# Patient Record
Sex: Female | Born: 1935 | Race: Black or African American | Hispanic: No | Smoking: Never smoker
Health system: Southern US, Community
[De-identification: ages and names within clinical notes are randomized; demographics above are authoritative.]

## PROBLEM LIST (undated history)

## (undated) ENCOUNTER — Emergency Department (HOSPITAL_COMMUNITY): Admission: EM | Payer: Medicare Other

## (undated) DIAGNOSIS — IMO0001 Reserved for inherently not codable concepts without codable children: Secondary | ICD-10-CM

## (undated) DIAGNOSIS — I10 Essential (primary) hypertension: Secondary | ICD-10-CM

---

## 1999-10-08 ENCOUNTER — Inpatient Hospital Stay (HOSPITAL_COMMUNITY): Admission: EM | Admit: 1999-10-08 | Discharge: 1999-10-11 | Payer: Self-pay | Admitting: Emergency Medicine

## 1999-10-08 ENCOUNTER — Encounter: Payer: Self-pay | Admitting: Orthopedic Surgery

## 2000-11-30 ENCOUNTER — Encounter: Payer: Self-pay | Admitting: Internal Medicine

## 2000-11-30 ENCOUNTER — Ambulatory Visit (HOSPITAL_COMMUNITY): Admission: RE | Admit: 2000-11-30 | Discharge: 2000-11-30 | Payer: Self-pay | Admitting: Internal Medicine

## 2000-12-05 ENCOUNTER — Encounter: Payer: Self-pay | Admitting: Internal Medicine

## 2000-12-05 ENCOUNTER — Ambulatory Visit (HOSPITAL_COMMUNITY): Admission: RE | Admit: 2000-12-05 | Discharge: 2000-12-05 | Payer: Self-pay | Admitting: Internal Medicine

## 2006-03-29 ENCOUNTER — Ambulatory Visit (HOSPITAL_COMMUNITY): Admission: RE | Admit: 2006-03-29 | Discharge: 2006-03-29 | Payer: Self-pay | Admitting: Family Medicine

## 2007-08-01 ENCOUNTER — Ambulatory Visit (HOSPITAL_COMMUNITY): Admission: RE | Admit: 2007-08-01 | Discharge: 2007-08-01 | Payer: Self-pay | Admitting: Family Medicine

## 2008-09-14 ENCOUNTER — Ambulatory Visit (HOSPITAL_COMMUNITY): Admission: RE | Admit: 2008-09-14 | Discharge: 2008-09-14 | Payer: Self-pay | Admitting: Family Medicine

## 2010-02-06 ENCOUNTER — Encounter: Payer: Self-pay | Admitting: Family Medicine

## 2010-06-03 NOTE — Op Note (Signed)
[place]  Patient:    Joyce Carpenter, Joyce Carpenter                    MRN: [id number] Proc. Date: 10/08/99 Adm. Date:  [id number] Attending:  Susa Day                           Operative Report  PREOPERATIVE DIAGNOSIS:  Profound abscess, left thumb and index web space, status post laceration of volar aspect of left thumb metacarpal phalangeal joint flexion crease and thenar eminence 72 hours prior.  POSTOPERATIVE DIAGNOSIS:  Thumb and index web space and mid palmar space abscess.  OPERATION:  Incision and drainage of thumb index web space and mid palmar space with placement of a total of four vessel loop drains.  OPERATING SURGEON:  Katy Fitch. Sypher, Montez Hageman., M.D.  ASSISTANT:  Jonni Sanger, P.A.  ANESTHESIA:  Axillary block.  ANESTHESIOLOGIST:  Halford Decamp, M.D.  CULTURES:  Aerobic and anaerobic.  INDICATIONS:  Joyce Carpenter is a 77 year old woman transferred by Dr. Ernestina Penna at the [facility] Emergency Room after identification of a complex abscess involving the left thumb and index web space.  She reported that three days ago she was cutting tomatoes and accidentally lacerated the palmar aspect of her left thenar eminence overlying the MP flexion crease. She did not week medical attention until the morning of October 08, 1999, when she was noted to have profound swelling, fever, and malaise.  At the hospital, she was noted to be febrile and laboratory studies revealed an elevated white blood cell count at 13,000 with 83% neutrophils.  Her platelet count was 242,000.  Dr. Romeo Apple, an orthopedic surgeon in [place], [place], was called for consultation and recommended immediate transfer to [facility] [employer] for evaluation by the hand surgery service.  Joyce Carpenter was transported by the EMS unit to the ER and taken directly to the operating room as soon as possible.  DESCRIPTION OF  PROCEDURE:  Joyce Carpenter was brought to the operating room and placed in the supine position on the operating table.  Following placement of an axillary block in the holding area by J. Claybon Jabs, M.D., anesthesia was satisfactory in the left arm.  The arm was prepped with Betadine soap and solution and sterilely draped.  Following elevation of the hand for two minutes, the arterial tourniquet was inflated to 270 mmHg due to mild cephalic hypertension.  The procedure commenced with extension of the traumatic wound, immediately evacuating approximately 15 cc of grossly purulent material.  This was cultured for aerobic and anaerobic growth.  The wound was then extended with a Brunners zigzag incision proximally over the thenar eminence.  Care was taken to identify the radial and ulnar proper digital arteries and the princeps pollicis artery.  There was noted to be moderate subcutaneous necrosis and epidermolysis in the thumb and index web space.  A blunt hemostat was used through the thumb and index web space and a dorsal incision was fashioned to allow through and through irrigation.  The thenar space was probed with a blunt hemostat and drained of a moderate amount of purulent material.  The abscess was extended to the mid palmar space.  This was opened with blunt hemostat closed throughout dissection.  A small incision was fashioned along the middle palmar crease adjacent to the thenar crease to create a mid palmar through and through drainage  route and two vessel loops were placed from the thenar incision through the mid palmar space out through this second incision.  The thumb flexor sheath was inspected and no purulent material was noted within the flexor sheath.  Preoperatively there was no tenderness at [facility] space or overlying the flexor pollicis longus in the forearm.  The wound was thoroughly lavaged with sterile saline followed by triple antibiotic solution.  The  wounds were then dressed with Xeroflo, sterile gauze, Kerlix, sterile Webril, a Kerlix fluff, and a volar plaster splint. Ace wrap was applied for gentle compression.  There were no apparent complications.  Joyce Carpenter was awaken from anesthesia and transferred to the recovery room with stable vital signs.  After cultures were obtained, 1 g of Ancef was noted to have been given in [place], [place], prior to cultures and 1 g of vancomycin was administered here in the operating room.  There were no apparent complications. DD:  10/08/99 TD:  10/10/99 Job: [id number]

## 2010-06-03 NOTE — Discharge Summary (Signed)
[place]  Patient:    Joyce Carpenter, Joyce Carpenter                    MRN: [id number] Adm. Date:  [id number] Disc. Date: [id number] Attending:  Susa Day Dictator:   Jonni Sanger, P.A.-C                           Discharge Summary  ADMISSION DIAGNOSIS:  Left hand thenar space infection.  DISCHARGE DIAGNOSIS:  Left hand thenar space infection.  OPERATION PERFORMED:  I&D with intraoperative cultures of the left hand by Dr. Katy Fitch. Sypher under axillary block on October 08, 1999.  CONSULTATIONS:  None.  HISTORY:  The patient is a 77 year old right-hand dominant female who sustained a puncture wound to the volar aspect of the left hand thenar eminence four days prior to her presentation with a kitchen knife.  She noted progressive pain and swelling to the thenar eminence extending distally into the thumb.  She presented to [facility].  The patient was seen in the emergency room and referred for further evaluation and treatment.  She was noted to have a tense swelling of the thenar eminence with lymphangitis to the elbow level.  Neurovascular motor functions were intact.  She had moderate pain to palpation.  The flexor longus function was okay.  There were no epitrochlear nodes palpated.  It was decided to take the patient to the operating room for I&D and intraoperative cultures with IV antibiotics postoperatively.  LABORATORY DATA:  Preoperative labs revealed hemoglobin of 11.4 with hematocrit 34.2, white blood cell count 13.0, with 242,000 platelets, 83% neutrophils.  Guaiac studies within normal limits.  Preoperative chest x-ray revealed decreased aeration of lung bases related to atelectasis or small effusions.  There was an enlarged cardiac silhouette.  EKG revealed normal sinus rhythm with left ventricular hypertrophy.  Results of her intraoperative cultures revealed moderate alpha hemolytic strep.  There were no  anaerobes isolated.  HOSPITAL COURSE:  The patient was taken to the operating room on October 08, 1999, where she underwent I&D of the left hand thenar space, axillary block, by Katy Fitch. Sypher.  One loop drain was placed.  Postoperatively, she was placed on vancomycin per the pharmacy protocol.  On October 09, 1999, her first day postop, she had a maximum temperature of 102.3 but was afebrile for the remainder of the day.  Gram stain at this time revealed gram-positive cocci, some gram-negative rods.  White blood cell count was down to 11.5.  Her vancomycin was continued per the pharmacy protocol.  On her second day postop, she remained afebrile with stale vital signs.  She would continue to slowly improve.  Her pain was decreasing.  Range of motion was improving.  Dressing was changed on this day, and the wound appearance was okay.  There were no areas of necrosis.  Swelling was markedly decreased both dorsally and across the thenar eminence.  There was no lymphangitis noted. Neurovascular and motor functions were intact distally.  We placed her in new bulky dressing.  The vessel loop drains were left in place secondary to continued serosanguineous drainage.  Her IV vancomycin was continued on her third day postop on September 25.  She remained afebrile.  She was doing well overall.  Her pain was decreasing.  She had a good appetite.  No complaints of numbness or tingling.  On exam, her swelling was  decreasing.  She had good IP motion.  Neurovascular and motor functions were intact.  Her culture was growing gram-positive cocci which was thought to be strep.  There was no growth on anaerobes or of her gram-negative rod which was initially seen on Gram stain.  At this time, it was felt that the patient was stable and ready for discharge to home.  DISCHARGE MEDICATIONS:  Levaquin 500 mg q.d. for two weeks.  FOLLOWUP:  She will return to see Dr. Josephine Igo in our office  on Wednesday, October 12, 1999, at 2 oclock.  WOUND CARE:  She will keep her dressing dry and keep her hand elevated.  SPECIAL INSTRUCTIONS:  We will have the social worker assist the patient with her antibiotic prescription needs.  DIET:  As tolerated.  CONDITION UPON DISCHARGE:  Stable and improved.  FINAL DIAGNOSIS:  Left hand thenar space infection requiring incision and drainage. DD:  11/15/99 TD:  11/15/99 Job: [id number]

## 2011-08-16 ENCOUNTER — Ambulatory Visit (HOSPITAL_COMMUNITY)
Admission: RE | Admit: 2011-08-16 | Discharge: 2011-08-16 | Disposition: A | Discharge status: Home / Self Care | Payer: Medicare Other | Source: Ambulatory Visit | Attending: Family Medicine | Admitting: Family Medicine

## 2011-08-16 ENCOUNTER — Other Ambulatory Visit (HOSPITAL_COMMUNITY): Payer: Self-pay | Admitting: Family Medicine

## 2011-08-16 DIAGNOSIS — M7500 Adhesive capsulitis of unspecified shoulder: Secondary | ICD-10-CM

## 2011-08-16 DIAGNOSIS — R937 Abnormal findings on diagnostic imaging of other parts of musculoskeletal system: Secondary | ICD-10-CM | POA: Insufficient documentation

## 2011-08-16 DIAGNOSIS — M25519 Pain in unspecified shoulder: Secondary | ICD-10-CM | POA: Insufficient documentation

## 2013-10-07 ENCOUNTER — Observation Stay (HOSPITAL_COMMUNITY)
Admission: EM | Admit: 2013-10-07 | Discharge: 2013-10-10 | Disposition: A | Discharge status: Home / Self Care | Payer: PRIVATE HEALTH INSURANCE | Attending: Emergency Medicine | Admitting: Emergency Medicine

## 2013-10-07 DIAGNOSIS — G459 Transient cerebral ischemic attack, unspecified: Principal | ICD-10-CM | POA: Diagnosis present

## 2013-10-07 DIAGNOSIS — I10 Essential (primary) hypertension: Secondary | ICD-10-CM | POA: Diagnosis not present

## 2013-10-07 DIAGNOSIS — R4182 Altered mental status, unspecified: Secondary | ICD-10-CM | POA: Insufficient documentation

## 2013-10-07 DIAGNOSIS — Z79899 Other long term (current) drug therapy: Secondary | ICD-10-CM | POA: Diagnosis not present

## 2013-10-07 HISTORY — DX: Essential (primary) hypertension: I10

## 2013-10-08 ENCOUNTER — Observation Stay (HOSPITAL_COMMUNITY): Payer: PRIVATE HEALTH INSURANCE

## 2013-10-08 ENCOUNTER — Encounter (HOSPITAL_COMMUNITY): Payer: Self-pay | Admitting: Emergency Medicine

## 2013-10-08 ENCOUNTER — Other Ambulatory Visit (HOSPITAL_COMMUNITY): Payer: Medicare Other

## 2013-10-08 ENCOUNTER — Emergency Department (HOSPITAL_COMMUNITY): Payer: PRIVATE HEALTH INSURANCE

## 2013-10-08 DIAGNOSIS — G459 Transient cerebral ischemic attack, unspecified: Secondary | ICD-10-CM | POA: Diagnosis not present

## 2013-10-08 LAB — IRON AND TIBC
Iron: 16 ug/dL — ABNORMAL LOW (ref 42–135)
Saturation Ratios: 6 % — ABNORMAL LOW (ref 20–55)
TIBC: 272 ug/dL (ref 250–470)
UIBC: 256 ug/dL (ref 125–400)

## 2013-10-08 LAB — BASIC METABOLIC PANEL
ANION GAP: 13 (ref 5–15)
BUN: 33 mg/dL — ABNORMAL HIGH (ref 6–23)
CALCIUM: 9.6 mg/dL (ref 8.4–10.5)
CO2: 24 mEq/L (ref 19–32)
Chloride: 101 mEq/L (ref 96–112)
Creatinine, Ser: 1.39 mg/dL — ABNORMAL HIGH (ref 0.50–1.10)
GFR, EST AFRICAN AMERICAN: 40 mL/min — AB (ref >90)
GFR, EST NON AFRICAN AMERICAN: 35 mL/min — AB (ref >90)
GLUCOSE: 107 mg/dL — AB (ref 70–99)
Potassium: 5 mEq/L (ref 3.7–5.3)
SODIUM: 138 meq/L (ref 137–147)

## 2013-10-08 LAB — HEMOGLOBIN A1C
Hgb A1c MFr Bld: 5.7 % — ABNORMAL HIGH (ref <5.7)
MEAN PLASMA GLUCOSE: 117 mg/dL — AB (ref <117)

## 2013-10-08 LAB — PRO B NATRIURETIC PEPTIDE: PRO B NATRI PEPTIDE: 531.4 pg/mL — AB (ref 0–450)

## 2013-10-08 LAB — RETICULOCYTES
RBC.: 2.99 MIL/uL — AB (ref 3.87–5.11)
Retic Count, Absolute: 32.9 10*3/uL (ref 19.0–186.0)
Retic Ct Pct: 1.1 % (ref 0.4–3.1)

## 2013-10-08 LAB — CBC
HCT: 24.6 % — ABNORMAL LOW (ref 36.0–46.0)
Hemoglobin: 7.5 g/dL — ABNORMAL LOW (ref 12.0–15.0)
MCH: 26.7 pg (ref 26.0–34.0)
MCHC: 30.5 g/dL (ref 30.0–36.0)
MCV: 87.5 fL (ref 78.0–100.0)
PLATELETS: 275 10*3/uL (ref 150–400)
RBC: 2.81 MIL/uL — ABNORMAL LOW (ref 3.87–5.11)
RDW: 14.5 % (ref 11.5–15.5)
WBC: 4.3 10*3/uL (ref 4.0–10.5)

## 2013-10-08 LAB — CBC WITH DIFFERENTIAL/PLATELET
BASOS ABS: 0.1 10*3/uL (ref 0.0–0.1)
BASOS PCT: 1 % (ref 0–1)
EOS ABS: 0.4 10*3/uL (ref 0.0–0.7)
EOS PCT: 6 % — AB (ref 0–5)
HCT: 26.6 % — ABNORMAL LOW (ref 36.0–46.0)
Hemoglobin: 8.2 g/dL — ABNORMAL LOW (ref 12.0–15.0)
Lymphocytes Relative: 32 % (ref 12–46)
Lymphs Abs: 2 10*3/uL (ref 0.7–4.0)
MCH: 27.5 pg (ref 26.0–34.0)
MCHC: 30.8 g/dL (ref 30.0–36.0)
MCV: 89.3 fL (ref 78.0–100.0)
Monocytes Absolute: 0.3 10*3/uL (ref 0.1–1.0)
Monocytes Relative: 4 % (ref 3–12)
Neutro Abs: 3.6 10*3/uL (ref 1.7–7.7)
Neutrophils Relative %: 57 % (ref 43–77)
PLATELETS: 288 10*3/uL (ref 150–400)
RBC: 2.98 MIL/uL — ABNORMAL LOW (ref 3.87–5.11)
RDW: 14.9 % (ref 11.5–15.5)
WBC: 6.4 10*3/uL (ref 4.0–10.5)

## 2013-10-08 LAB — VITAMIN B12: VITAMIN B 12: 1156 pg/mL — AB (ref 211–911)

## 2013-10-08 LAB — LIPID PANEL
CHOL/HDL RATIO: 2 ratio
CHOLESTEROL: 125 mg/dL (ref 0–200)
HDL: 62 mg/dL (ref >39)
LDL Cholesterol: 51 mg/dL (ref 0–99)
TRIGLYCERIDES: 62 mg/dL (ref <150)
VLDL: 12 mg/dL (ref 0–40)

## 2013-10-08 LAB — FOLATE

## 2013-10-08 LAB — FERRITIN: Ferritin: 20 ng/mL (ref 10–291)

## 2013-10-08 LAB — SEDIMENTATION RATE: SED RATE: 84 mm/h — AB (ref 0–22)

## 2013-10-08 LAB — AMMONIA: AMMONIA: 18 umol/L (ref 11–60)

## 2013-10-08 MED ORDER — SENNOSIDES-DOCUSATE SODIUM 8.6-50 MG PO TABS
1.0000 | ORAL_TABLET | Freq: Every evening | ORAL | Status: DC | PRN
  Filled 2013-10-08: qty 1

## 2013-10-08 MED ORDER — STROKE: EARLY STAGES OF RECOVERY BOOK
Freq: Once | Status: AC
Start: 2013-10-08 — End: 2013-10-08
  Administered 2013-10-08: 1
  Filled 2013-10-08: qty 1

## 2013-10-08 MED ORDER — ENOXAPARIN SODIUM 30 MG/0.3ML ~~LOC~~ SOLN
30.0000 mg | SUBCUTANEOUS | Status: DC
  Administered 2013-10-09 – 2013-10-10 (×2): 30 mg via SUBCUTANEOUS
  Filled 2013-10-08 (×2): qty 0.3

## 2013-10-08 MED ORDER — ASPIRIN 325 MG PO TABS
325.0000 mg | ORAL_TABLET | Freq: Every day | ORAL | Status: DC
Start: 2013-10-08 — End: 2013-10-10
  Administered 2013-10-08 – 2013-10-10 (×3): 325 mg via ORAL
  Filled 2013-10-08 (×3): qty 1

## 2013-10-08 NOTE — ED Notes (Signed)
Dr. Read Drivers at bedside. Pt. Now able to state name but is still confused.

## 2013-10-08 NOTE — H&P (Signed)
Hospitalist Admission History and Physical  Patient name: Joyce Carpenter Medical record number: [id number] Date of birth: 1930/09/13 Age: 80 y.o. Gender: female  Primary Care Provider: [name], MD  Chief Complaint: TIA  History of Present Illness:This is a 80 y.o. year old female with significant past medical history of HTN presenting with TIA, encephelopathy. Level V caveat as pt and family are overall very poor historians. Per family. Pt was noted to have 1-2 episodes of confusion while at home. Pt has some staring episodes with R hand shaking as well as confusion. Family also noticed ? Elevated BP at home.  Family was concerned and brought pt to the ER for further evaluation.  On presentation, afebrile, HR in 60s, Resp in 10s, BP in 180s-150s. Satting 98% on RA. Notable labs include hgb 8.2, Cr 1.39. Pt had witnessed episode of aphasia and confusion while in ER. Head CT, CXR, UA pending.   Assessment and Plan: Joyce Carpenter is a 80 y.o. year old female presenting with TIA, encephalopathy   Active Problems:   TIA (transient ischemic attack)   1-TIA/encephalopathy  -proceed down stroke pathway including MRI/MRA, 2D ECHO, risk stratification labs (carotid dopplers not available apparently at AP).  -check ammonia level and EEG -f/u UA and CXR-afebrile on presentation without leukocytosis  -full dose ASA  -continue to follow   2-HTN -mildly elevated BP on presentation -will hold on treating given #1 as to avoid hypoperfusion -will trend-treat if markedly above presentation pressures  -noted LE edema  -check pro BNP  -f/u 2D ECHO    3-Anemia  -unclear etiology -no active signs of bleeding  -normocytic MCV -check anemia panel  -hemoccult   4-AKI  -stage 4 CKD on presentation -unclear if this is baseline -will trend  -try to avoid nephrotoxic agents   FEN/GI: NPO pending bedside swallow eval  Prophylaxis: lovenox Disposition: pending further  evaluation  Code Status:Full Code    Patient Active Problem List   Diagnosis Date Noted  . TIA (transient ischemic attack) 10/08/2013   Past Medical History: Past Medical History  Diagnosis Date  . Hypertension     Past Surgical History: No past surgical history on file.  Social History: History   Social History  . Marital Status: Widowed    Spouse Name: N/A    Number of Children: N/A  . Years of Education: N/A   Social History Main Topics  . Smoking status: Not on file  . Smokeless tobacco: Not on file  . Alcohol Use: Not on file  . Drug Use: Not on file  . Sexual Activity: Not on file   Other Topics Concern  . Not on file   Social History Narrative  . No narrative on file    Family History: No family history on file.  Allergies: No Known Allergies  Current Facility-Administered Medications  Medication Dose Route Frequency Provider Last Rate Last Dose  .  stroke: mapping our early stages of recovery book   Does not apply Once Doree Albee, MD      . aspirin tablet 325 mg  325 mg Oral Daily Doree Albee, MD      . enoxaparin (LOVENOX) injection 30 mg  30 mg Subcutaneous Q24H Doree Albee, MD      . senna-docusate (Senokot-S) tablet 1 tablet  1 tablet Oral QHS PRN Doree Albee, MD       No current outpatient prescriptions on file.   Review Of Systems: 12 point ROS negative except as noted  above in HPI.  Physical Exam: Filed Vitals:   10/08/13 0003  BP: 187/98  Pulse: 62  Temp: 98 F (36.7 C)  Resp: 18    General: cooperative and mildly confused  HEENT: PERRLA and extra ocular movement intact Heart: S1, S2 normal, no murmur, rub or gallop, regular rate and rhythm Lungs: clear to auscultation, no wheezes or rales and unlabored breathing Abdomen: abdomen is soft without significant tenderness, masses, organomegaly or guarding Extremities: 2+ peripheral pulses, 1-2+ edema bilaterally  Skin:no rashes, no ecchymoses Neurology: normal without  focal findings  Labs and Imaging: Lab Results  Component Value Date/Time   NA 138 10/08/2013 12:27 AM   K 5.0 10/08/2013 12:27 AM   CL 101 10/08/2013 12:27 AM   CO2 24 10/08/2013 12:27 AM   BUN 33* 10/08/2013 12:27 AM   CREATININE 1.39* 10/08/2013 12:27 AM   GLUCOSE 107* 10/08/2013 12:27 AM   Lab Results  Component Value Date   WBC 6.4 10/08/2013   HGB 8.2* 10/08/2013   HCT 26.6* 10/08/2013   MCV 89.3 10/08/2013   PLT 288 10/08/2013    No results found.         Doree Albee MD  Pager: [phone]

## 2013-10-08 NOTE — ED Notes (Signed)
Pt. Reports that she does not understand why she is at the hospital. Pt. States "I was sitting in my chair dreaming and then they brought me here" Pt. Sister reports that pt. Began having tremors and was "not acting right".

## 2013-10-08 NOTE — ED Notes (Signed)
Pt. Family called RN to bedside stating that "pt is doing it again" Pt. Altered mental status. Pt. Confused and spitting saliva on chest. Pt. Able to speak clearly. Pt. Moving all extremities equally. Pt. Disoriented x4.

## 2013-10-08 NOTE — Progress Notes (Signed)
Tech came from [facility] to perform EEG, patient in X-Ray and MRI per nurse and nurse tech. EEG will be performed 10/09/13

## 2013-10-08 NOTE — Evaluation (Signed)
Clinical/Bedside Swallow Evaluation Patient Details  Name: Joyce Carpenter MRN: [id number] Date of Birth: 12-Mar-1930  Today's Date: 10/08/2013 Time: 0940-1000 SLP Time Calculation (min): 20 min  Past Medical History:  Past Medical History  Diagnosis Date  . Hypertension    Past Surgical History: History reviewed. No pertinent past surgical history. HPI:  Joyce Carpenter is a 80 y.o. female who was brought to the Emergency Department via EMS presenting with AMS according to her family. Per EMS records, the family reported two episodes of AMS this evening.  At the bedside, the Joyce Carpenter states she was dreaming and upon awakening experienced difficulty verbalizing her sister's name even though she knew it. She denies a sense of paralysis upon awakening. The Joyce Carpenter also states her family told her that she was shaking during the episode. She denies chest pain, SOB, nausea, diarrhea, or dysuria.   Assessment / Plan / Recommendation Clinical Impression  Joyce Carpenter is a very pleasant 80 yo woman who was admitted with altered mental status. Bedside swallow evaluation was ordered by admitting MD and RN swallow screen has not been completed. Oral motor examination is uremarkable and Joyce Carpenter readily and safely accepted po trials. She showed no overt signs or symptoms of aspiration and was independently self feeding. Joyce Carpenter had some difficulty answering questions due to suspected hearing impairment. She often requested clarification and repeated questions before answering (delayed processing vs hard of hearing). Joyce Carpenter reports that she "can hear, but can't always understand". She reports that this started rather ubruptly about six years ago. She is unsure if she has ever seen an audiologist. Her sister, Joyce Carpenter, arrived after completion of the evaluation and is unsure if her hearing has been assessed. She feels it is more of a hearing problem than an "understanding" problem. Recommend regular textures with  thin liquids with standard aspiration precautions. SLP will follow for cognitive linguistic evaluation. Joyce Carpenter reportedly lives with her sister, Joyce Carpenter, and it would be helpful to gain an idea of her true baseline from her.    Aspiration Risk  None    Diet Recommendation Regular;Thin liquid   Liquid Administration via: Cup;Straw Medication Administration: Whole meds with liquid Supervision: Patient able to self feed Postural Changes and/or Swallow Maneuvers: Out of bed for meals;Seated upright 90 degrees;Upright 30-60 min after meal    Other  Recommendations Oral Care Recommendations: Oral care BID Other Recommendations: Clarify dietary restrictions   Follow Up Recommendations  Home health SLP (for cognition possibly)    Frequency and Duration        Pertinent Vitals/Pain VSS    SLP Swallow Goals  N/A   Swallow Study Prior Functional Status   Lives at home with sister, Joyce Carpenter Date of Onset: 10/07/13 HPI: Joyce Carpenter is a 80 y.o. female who was brought to the Emergency Department via EMS presenting with AMS according to her family. Per EMS records, the family reported two episodes of AMS this evening.  At the bedside, the Joyce Carpenter states she was dreaming and upon awakening experienced difficulty verbalizing her sister's name even though she knew it. She denies a sense of paralysis upon awakening. The Joyce Carpenter also states her family told her that she was shaking during the episode. She denies chest pain, SOB, nausea, diarrhea, or dysuria. Type of Study: Bedside swallow evaluation Diet Prior to this Study: NPO Temperature Spikes Noted: No Respiratory Status: Room air History of Recent Intubation: No Behavior/Cognition: Alert;Cooperative;Pleasant mood;Hard of hearing Oral Cavity - Dentition:  Adequate natural dentition Self-Feeding Abilities: Able to feed self Patient Positioning: Upright in bed Baseline Vocal Quality: Clear Volitional Cough: Strong Volitional Swallow:  Able to elicit    Oral/Motor/Sensory Function Overall Oral Motor/Sensory Function: Appears within functional limits for tasks assessed   Ice Chips Ice chips: Within functional limits Presentation: Spoon   Thin Liquid Thin Liquid: Within functional limits Presentation: Cup;Self Fed;Straw    Nectar Thick Nectar Thick Liquid: Not tested   Honey Thick Honey Thick Liquid: Not tested   Puree Puree: Within functional limits Presentation: Self Fed;Spoon   Solid   GO Functional Assessment Tool Used: clinical judgement Functional Limitations: Swallowing Swallow Current Status (Z6109): 0 percent impaired, limited or restricted Swallow Goal Status (U0454): 0 percent impaired, limited or restricted Swallow Discharge Status [phone]): 0 percent impaired, limited or restricted  Solid: Within functional limits Presentation: Self Fed       PORTER,DABNEY 10/08/2013,3:23 PM

## 2013-10-08 NOTE — Progress Notes (Signed)
UR completed 

## 2013-10-08 NOTE — Progress Notes (Signed)
Patient has had progressive mild dementia intermittent throughout the past 12-18 months living at home with her sister. Joyce Carpenter ZOX:[id number] DOB: [birth year] DOA: 10/07/2013 PCP: [name], MD             Physical Exam: Blood pressure 158/60, pulse 63, temperature 98.5 F (36.9 C), temperature source Oral, resp. rate 18, height  (1.6 m), weight 153 lb (69.4 kg), SpO2 100.00%. alert and oriented x3. Patient has difficulty understanding some questions in relating family members names. No JVD no carotid bruit thyromegaly or thyroid bruits lungs clear to A&P no rales rhonchi appreciable heart regular positive no heaves thrills or rubs traced 1+ chronic pedal edema from venous insufficiency presumably.   Investigations:  No results found for this or any previous visit (from the past 240 hour(s)).   Basic Metabolic Panel:  Recent Labs  81/19/14 0027  NA 138  K 5.0  CL 101  CO2 24  GLUCOSE 107*  BUN 33*  CREATININE 1.39*  CALCIUM 9.6   Liver Function Tests: No results found for this basename: AST, ALT, ALKPHOS, BILITOT, PROT, ALBUMIN,  in the last 72 hours   CBC:  Recent Labs  10/08/13 0027  WBC 6.4  NEUTROABS 3.6  HGB 8.2*  HCT 26.6*  MCV 89.3  PLT 288    Dg Chest 2 View  10/08/2013   CLINICAL DATA:  Altered mental status and elevated blood pressure tonight  EXAM: CHEST  2 VIEW  COMPARISON:  None.  FINDINGS: Moderately severe cardiac enlargement. Vascular pattern is normal. Right lung is clear. Mild left lower lobe atelectasis. No consolidation or effusion. Calcification of the aorta.  IMPRESSION: Moderate cardiac enlargement.  No acute findings otherwise.   Electronically Signed   By: Esperanza Heir M.D.   On: 10/08/2013 02:15   Ct Head Wo Contrast  10/08/2013   CLINICAL DATA:  Altered mental status.  EXAM: CT HEAD WITHOUT CONTRAST  TECHNIQUE: Contiguous axial images were obtained from the base of the skull through the vertex without  intravenous contrast.  COMPARISON:  None.  FINDINGS: Skull and Sinuses:Negative for fracture or destructive process. The mastoids, middle ears, and imaged paranasal sinuses are clear.  Orbits: No acute abnormality.  Brain: No evidence of acute abnormality, such as acute infarction, hemorrhage, hydrocephalus, or mass lesion/mass effect. Typical senescent changes for age, including mild cerebral volume loss and mild small vessel ischemic injury around the lateral ventricles.  IMPRESSION: No acute intracranial findings.   Electronically Signed   By: Tiburcio Pea M.D.   On: 10/08/2013 02:27      Medications:   Impression: Progressive mild to moderate dementia over 12-18 months altered mental status. Hypertension well controlled hyperlipidemia well controlled anemia workup in progress. Dementia workup in progress. Active Problems:   TIA (transient ischemic attack)     Plan: Await anemia and dementia workup    Consultants: None    Procedures   Antibiotics:                   Code Status: Full.   Family Communication:    Disposition Plan   Time spent: 30 minutes.   LOS: 1 day   Ridhaan Dreibelbis M   10/08/2013, 12:26 PM

## 2013-10-08 NOTE — ED Provider Notes (Addendum)
CSN: [id number]     Arrival date & time 10/07/13  2359 History  This chart was scribed for Hanley Seamen, MD, by Yevette Edwards, ED Scribe. This patient was seen in room APA10/APA10 and the patient's care was started at 12:10 AM.   None    Chief Complaint  Patient presents with  . Altered Mental Status    The history is provided by the patient. No language interpreter was used.    HPI Comments: Joyce Carpenter is a 80 y.o. female who was brought to the Emergency Department via EMS presenting with AMS according to her family. Per EMS records, the family reported two episodes of AMS this evening.  At the bedside, the pt states she was dreaming and upon awakening experienced difficulty verbalizing her sister's name even though she knew it. She denies a sense of paralysis upon awakening. The pt also states her family told her that she was shaking during the episode. She denies chest pain, SOB, nausea, diarrhea, or dysuria.   Past Medical History  Diagnosis Date  . Hypertension    History reviewed. No pertinent past surgical history. No family history on file. History  Substance Use Topics  . Smoking status: Not on file  . Smokeless tobacco: Not on file  . Alcohol Use: Not on file   OB History   Grav Para Term Preterm Abortions TAB SAB Ect Mult Living                 Review of Systems  A complete 10 system review of systems was obtained, and all systems were negative except where indicated in the HPI and PE.    Allergies  Review of patient's allergies indicates no known allergies.  Home Medications   Prior to Admission medications   Medication Sig Start Date End Date Taking? Authorizing Provider  amLODipine (NORVASC) 5 MG tablet Take 5 mg by mouth daily.   Yes Historical Provider, MD  atorvastatin (LIPITOR) 20 MG tablet Take 20 mg by mouth daily.   Yes Historical Provider, MD  cloNIDine (CATAPRES) 0.1 MG tablet Take 0.1 mg by mouth 2 (two) times daily.   Yes Historical  Provider, MD  lisinopril-hydrochlorothiazide (PRINZIDE,ZESTORETIC) 20-12.5 MG per tablet Take 1 tablet by mouth daily.   Yes Historical Provider, MD   Triage Vitals:  BP 187/98  Pulse 62  Temp(Src) 98 F (36.7 C) (Oral)  Resp 18  Ht  (1.6 m)  Wt 153 lb (69.4 kg)  BMI 27.11 kg/m2  SpO2 98%  Physical Exam  General: Well-developed, well-nourished female in no acute distress; appearance consistent with age of record HENT: normocephalic; atraumatic Eyes: pupils equal, round and reactive to light; extraocular muscles intact; arcus senilis bilaterally  Neck: supple Heart: regular rate and rhythm; no murmurs, rubs or gallops Lungs: clear to auscultation bilaterally Abdomen: soft; nondistended; nontender; no masses or hepatosplenomegaly; bowel sounds present Extremities: 2+ edema of lower legs which appears chronic; normal pulses No deformity; full range of motion;  Neurologic: Awake, alert and oriented; motor function intact in all extremities and symmetric; no facial droop; normal coordination and speech; hard of hearing  Skin: Warm and dry Psychiatric: Normal mood and affect  ED Course  Procedures (including critical care time)  DIAGNOSTIC STUDIES: Oxygen Saturation is 98% on room air, normal by my interpretation.    COORDINATION OF CARE:  12:20 AM- Discussed treatment plan with patient, and the patient agreed to the plan. The plan includes lab work.  MDM   Nursing notes and vitals signs, including pulse oximetry, reviewed.  Summary of this visit's results, reviewed by myself:  Labs:  Results for orders placed during the hospital encounter of 10/07/13 (from the past 24 hour(s))  CBC WITH DIFFERENTIAL     Status: Abnormal   Collection Time    10/08/13 12:27 AM      Result Value Ref Range   WBC 6.4  4.0 - 10.5 K/uL   RBC 2.98 (*) 3.87 - 5.11 MIL/uL   Hemoglobin 8.2 (*) 12.0 - 15.0 g/dL   HCT 40.9 (*) 81.1 - 91.4 %   MCV 89.3  78.0 - 100.0 fL   MCH 27.5  26.0 -  34.0 pg   MCHC 30.8  30.0 - 36.0 g/dL   RDW 78.2  95.6 - 21.3 %   Platelets 288  150 - 400 K/uL   Neutrophils Relative % 57  43 - 77 %   Neutro Abs 3.6  1.7 - 7.7 K/uL   Lymphocytes Relative 32  12 - 46 %   Lymphs Abs 2.0  0.7 - 4.0 K/uL   Monocytes Relative 4  3 - 12 %   Monocytes Absolute 0.3  0.1 - 1.0 K/uL   Eosinophils Relative 6 (*) 0 - 5 %   Eosinophils Absolute 0.4  0.0 - 0.7 K/uL   Basophils Relative 1  0 - 1 %   Basophils Absolute 0.1  0.0 - 0.1 K/uL  BASIC METABOLIC PANEL     Status: Abnormal   Collection Time    10/08/13 12:27 AM      Result Value Ref Range   Sodium 138  137 - 147 mEq/L   Potassium 5.0  3.7 - 5.3 mEq/L   Chloride 101  96 - 112 mEq/L   CO2 24  19 - 32 mEq/L   Glucose, Bld 107 (*) 70 - 99 mg/dL   BUN 33 (*) 6 - 23 mg/dL   Creatinine, Ser 0.86 (*) 0.50 - 1.10 mg/dL   Calcium 9.6  8.4 - 57.8 mg/dL   GFR calc non Af Amer 35 (*) >90 mL/min   GFR calc Af Amer 40 (*) >90 mL/min   Anion gap 13  5 - 15  AMMONIA     Status: None   Collection Time    10/08/13  2:16 AM      Result Value Ref Range   Ammonia 18  11 - 60 umol/L  RETICULOCYTES     Status: Abnormal   Collection Time    10/08/13  2:16 AM      Result Value Ref Range   Retic Ct Pct 1.1  0.4 - 3.1 %   RBC. 2.99 (*) 3.87 - 5.11 MIL/uL   Retic Count, Manual 32.9  19.0 - 186.0 K/uL  PRO B NATRIURETIC PEPTIDE     Status: Abnormal   Collection Time    10/08/13  2:16 AM      Result Value Ref Range   Pro B Natriuretic peptide (BNP) 531.4 (*) 0 - 450 pg/mL  LIPID PANEL     Status: None   Collection Time    10/08/13  2:17 AM      Result Value Ref Range   Cholesterol 125  0 - 200 mg/dL   Triglycerides 62  <469 mg/dL   HDL 62  >62 mg/dL   Total CHOL/HDL Ratio 2.0     VLDL 12  0 - 40 mg/dL   LDL Cholesterol 51  0 -  99 mg/dL    EKG Interpretation  Date/Time:  Wednesday October 08 2013 01:23:58 EDT Ventricular Rate:  60 PR Interval:  222 QRS Duration: 90 QT Interval:  430 QTC  Calculation: 430 R Axis:   -24 Text Interpretation:  Sinus rhythm Prolonged PR interval RSR' in V1 or V2, probably normal variant LVH with secondary repolarization abnormality Baseline wander in lead(s) I V1 Rate is slower; PR is longer; poor R-wave progression Confirmed by Read Drivers  MD, Jonny Ruiz ([id number]) on 10/08/2013 1:30:28 AM      Imaging Studies: Dg Chest 2 View  10/08/2013   CLINICAL DATA:  Altered mental status and elevated blood pressure tonight  EXAM: CHEST  2 VIEW  COMPARISON:  None.  FINDINGS: Moderately severe cardiac enlargement. Vascular pattern is normal. Right lung is clear. Mild left lower lobe atelectasis. No consolidation or effusion. Calcification of the aorta.  IMPRESSION: Moderate cardiac enlargement.  No acute findings otherwise.   Electronically Signed   By: Esperanza Heir M.D.   On: 10/08/2013 02:15   Ct Head Wo Contrast  10/08/2013   CLINICAL DATA:  Altered mental status.  EXAM: CT HEAD WITHOUT CONTRAST  TECHNIQUE: Contiguous axial images were obtained from the base of the skull through the vertex without intravenous contrast.  COMPARISON:  None.  FINDINGS: Skull and Sinuses:Negative for fracture or destructive process. The mastoids, middle ears, and imaged paranasal sinuses are clear.  Orbits: No acute abnormality.  Brain: No evidence of acute abnormality, such as acute infarction, hemorrhage, hydrocephalus, or mass lesion/mass effect. Typical senescent changes for age, including mild cerebral volume loss and mild small vessel ischemic injury around the lateral ventricles.  IMPRESSION: No acute intracranial findings.   Electronically Signed   By: Tiburcio Pea M.D.   On: 10/08/2013 02:27    4:34 AM Was just alerted by a nurse the patient was acutely confused and unable to state even her own name. No focal weakness was noted. On my valuation of the patient she is now able to state her name but only after pondering the question. There is no focal weakness or drift noted. We will  obtain a CT of the head and EKG. We anticipate admission for TIAs. Have discussed with Dr. Alvester Morin of Hospitalist service.   I personally performed the services described in this documentation, which was scribed in my presence. The recorded information has been reviewed and is accurate.   Hanley Seamen, MD 10/08/13 0127  Hanley Seamen, MD 10/08/13 [phone]

## 2013-10-08 NOTE — ED Notes (Signed)
Pt's family called ems because pt had an episode where they felt she was not acting right.  This happened twice tonight.  Family also c/o elevated blood pressure.

## 2013-10-09 ENCOUNTER — Observation Stay (HOSPITAL_COMMUNITY)
Admit: 2013-10-09 | Discharge: 2013-10-09 | Disposition: A | Discharge status: Home / Self Care | Payer: PRIVATE HEALTH INSURANCE | Source: Home / Self Care | Attending: Family Medicine | Admitting: Family Medicine

## 2013-10-09 DIAGNOSIS — I517 Cardiomegaly: Secondary | ICD-10-CM

## 2013-10-09 DIAGNOSIS — G459 Transient cerebral ischemic attack, unspecified: Secondary | ICD-10-CM | POA: Diagnosis not present

## 2013-10-09 LAB — ABO/RH: ABO/RH(D): O POS

## 2013-10-09 LAB — TSH: TSH: 1 u[IU]/mL (ref 0.350–4.500)

## 2013-10-09 LAB — RPR

## 2013-10-09 LAB — PREPARE RBC (CROSSMATCH)

## 2013-10-09 MED ORDER — POLYSACCHARIDE IRON COMPLEX 150 MG PO CAPS
150.0000 mg | ORAL_CAPSULE | Freq: Every day | ORAL | Status: DC
  Administered 2013-10-09 – 2013-10-10 (×2): 150 mg via ORAL
  Filled 2013-10-09 (×2): qty 1

## 2013-10-09 MED ORDER — SODIUM CHLORIDE 0.9 % IV SOLN: Freq: Once | INTRAVENOUS | Status: DC

## 2013-10-09 NOTE — Care Management Note (Signed)
    Page 1 of 1   10/10/2013     2:45:22 PM CARE MANAGEMENT NOTE 10/10/2013  Patient:  Joyce Carpenter, Joyce Carpenter   Account Number:  [id number]  Date Initiated:  10/09/2013  Documentation initiated by:  Anibal Henderson  Subjective/Objective Assessment:   Admitted with TIA, AMS. Pt is from home with family and plans to return home. SLP recommends ST at home, possibly for cognition     Action/Plan:   May need HH to follow.   Anticipated DC Date:  10/09/2013   Anticipated DC Plan:  HOME/SELF CARE      DC Planning Services  CM consult      Choice offered to / List presented to:             Status of service:  In process, will continue to follow Medicare Important Message given?   (If response is "NO", the following Medicare IM given date fields will be blank) Date Medicare IM given:   Medicare IM given by:   Date Additional Medicare IM given:   Additional Medicare IM given by:    Discharge Disposition:  HOME/SELF CARE  Per UR Regulation:  Reviewed for med. necessity/level of care/duration of stay  If discussed at Long Length of Stay Meetings, dates discussed:    Comments:  10/10/13 1440 Anibal Henderson RN/CM  Pt being D/C home with family today. No needs identified 10/09/13 1500 Anibal Henderson RN/CM

## 2013-10-09 NOTE — Evaluation (Signed)
Occupational Therapy Evaluation Patient Details Name: Joyce Carpenter MRN: [id number] DOB: November 15, 1930 Today's Date: 10/09/2013    History of Present Illness Pt was admitted due to 1-2 episodes of confusion.  She is thought to have had a TIA/encephalopathy as all scans have been negative.  Pt lives with family and is extremely HOH.  She hears best from her right ear.   Clinical Impression   Pt is presenting to acute care with above situation.  She is presenting near baseline in ADL functioning.  Functional assessments of her strength and ROM in BUE are [facility].  Pt demonstrated modified independence with all ADLs.  Agree with SLP's assessment of possible need for hearing evaluation.  Pt needs no further acute OT services at this time.    Follow Up Recommendations  No OT follow up    Equipment Recommendations  None recommended by OT    Recommendations for Other Services       Precautions / Restrictions Precautions Precautions: Fall Restrictions Weight Bearing Restrictions: No      Mobility Bed Mobility Overal bed mobility: Modified Independent                Transfers Overall transfer level: Modified independent                    Balance Overall balance assessment: Needs assistance;History of Falls   Sitting balance-Leahy Scale: Normal     Standing balance support: No upper extremity supported Standing balance-Leahy Scale: Fair                              ADL Overall ADL's : At baseline                                       General ADL Comments: Pt demonstrated [facility] skills with functional mobility, toilet transfer, toilet hygiene, grooming at the sink, and lower body dressing.  pt did not verbalize any cocnerns about ADLs after d/c.     Vision                     Perception     Praxis      Pertinent Vitals/Pain Pain Assessment: No/denies pain     Hand Dominance Right   Extremity/Trunk Assessment Upper  Extremity Assessment Upper Extremity Assessment: Defer to OT evaluation   Lower Extremity Assessment Lower Extremity Assessment: Overall WFL for tasks assessed   Cervical / Trunk Assessment Cervical / Trunk Assessment: Kyphotic   Communication Communication Communication: HOH;Receptive difficulties;Expressive difficulties   Cognition Arousal/Alertness: Awake/alert Behavior During Therapy: WFL for tasks assessed/performed Overall Cognitive Status: No family/caregiver present to determine baseline cognitive functioning                     General Comments       Exercises       Shoulder Instructions      Home Living Family/patient expects to be discharged to:: Private residence Living Arrangements: Other relatives Available Help at Discharge: Family Type of Home: House Home Access: Ramped entrance     Home Layout: One level     Bathroom Shower/Tub: Tub/shower unit         Home Equipment: Emergency planning/management officer - 2 wheels;Cane - quad   Additional Comments: Pt had no difficulty with me providing details of home setting.  Prior Functioning/Environment Level of Independence: Independent with assistive device(s)        Comments: Pt states that usually uses her quad cane in the home but sometimes forgets it...she always uses it outside of the home.  She states that different family members have been asking her to use a walker for gait.  Pt was unable to provide significant details into her PLOR and assist needed for ADLs.  Pt reports independence.    OT Diagnosis:     OT Problem List:     OT Treatment/Interventions:      OT Goals(Current goals can be found in the care plan section) Acute Rehab OT Goals Patient Stated Goal: no OT goals needed OT Goal Formulation: With patient  OT Frequency:     Barriers to D/C:            Co-evaluation              End of Session Equipment Utilized During Treatment: Gait belt;Other (comment) (Quad  cane)  Activity Tolerance: Patient tolerated treatment well Patient left: in bed;with bed alarm set;with call bell/phone within reach   Time: 0910-0932 OT Time Calculation (min): 22 min Charges:  OT General Charges $OT Visit: 1 Procedure OT Evaluation $Initial OT Evaluation Tier I: 1 Procedure G-Codes: OT G-codes **NOT FOR INPATIENT CLASS** Functional Assessment Tool Used: Clinical Judgement Functional Limitation: Self care Self Care Current Status (A5409): At least 1 percent but less than 20 percent impaired, limited or restricted Self Care Goal Status (W1191): At least 1 percent but less than 20 percent impaired, limited or restricted Self Care Discharge Status [phone]): At least 1 percent but less than 20 percent impaired, limited or restricted   Marry Guan, MS, OTR/L [phone]  10/09/2013, 12:04 PM

## 2013-10-09 NOTE — Progress Notes (Signed)
EEG Completed; Results Pending  

## 2013-10-09 NOTE — Evaluation (Signed)
Physical Therapy Evaluation Patient Details Name: Joyce Carpenter MRN: [id number] DOB: 12-27-1930 Today's Date: 10/09/2013   History of Present Illness  Pt was admitted due to 1-2 episodes of confusion.  She is thought to have had a TIA/encephalopathy as all scans have been negative.  Pt lives with family and is extremely HOH.  She hears best from her right ear.  She states that she has fallen several times in the past but has had no injuries.  Clinical Impression   Pt was seen for evaluation.  She was very pleasant and cooperative displaying no confusion and able to give a fairly good history.  I did speak very slowly with a deep voice and this seemed to enable her to hear me.  Her standing balance is mildly deficient and her gait stability is improved with a walker.  She has one at home but has been using a quad cane for gait.  She is agreeable to use her walker instead.  Otherwise, she appears to be at prior functional level.    Follow Up Recommendations No PT follow up    Equipment Recommendations  None recommended by PT    Recommendations for Other Services   none    Precautions / Restrictions Precautions Precautions: Fall Restrictions Weight Bearing Restrictions: No      Mobility  Bed Mobility Overal bed mobility: Modified Independent                Transfers Overall transfer level: Modified independent                  Ambulation/Gait Ambulation/Gait assistance: Supervision Ambulation Distance (Feet): 150 Feet (once with quad cane and once with a walker) Assistive device: Rolling walker (2 wheeled);Quad cane Gait Pattern/deviations: Shuffle;Trunk flexed   Gait velocity interpretation: Below normal speed for age/gender General Gait Details: gait with a walker is much more stable than with a quad cane and I have recommended that pt use her walker especially out of the home  Stairs            Wheelchair Mobility    Modified Rankin (Stroke  Patients Only)       Balance Overall balance assessment: Needs assistance;History of Falls   Sitting balance-Leahy Scale: Normal     Standing balance support: No upper extremity supported Standing balance-Leahy Scale: Fair                               Pertinent Vitals/Pain Pain Assessment: No/denies pain    Home Living Family/patient expects to be discharged to:: Private residence Living Arrangements: Other relatives Available Help at Discharge: Family Type of Home: House Home Access: Ramped entrance     Home Layout: One level Home Equipment: Emergency planning/management officer - 2 wheels;Cane - quad Additional Comments: Pt had no difficulty with me providing details of home setting.    Prior Function Level of Independence: Independent with assistive device(s)         Comments: Pt states that usually uses her quad cane in the home but sometimes forgets it...she always uses it outside of the home.  She states that different family members have been asking her to use a walker for gait.     Hand Dominance   Dominant Hand: Right    Extremity/Trunk Assessment   Upper Extremity Assessment: Defer to OT evaluation           Lower Extremity Assessment: Overall WFL for tasks  assessed      Cervical / Trunk Assessment: Kyphotic  Communication   Communication: HOH  Cognition Arousal/Alertness: Awake/alert Behavior During Therapy: WFL for tasks assessed/performed Overall Cognitive Status: Within Functional Limits for tasks assessed                      General Comments      Exercises        Assessment/Plan    PT Assessment Patent does not need any further PT services  PT Diagnosis     PT Problem List    PT Treatment Interventions     PT Goals (Current goals can be found in the Care Plan section) Acute Rehab PT Goals PT Goal Formulation: No goals set, d/c therapy    Frequency     Barriers to discharge  none      Co-evaluation                End of Session Equipment Utilized During Treatment: Gait belt Activity Tolerance: Patient tolerated treatment well Patient left: in chair;with call bell/phone within reach;with chair alarm set Nurse Communication: Mobility status    Functional Assessment Tool Used: clinical judgement Functional Limitation: Mobility: Walking and moving around Mobility: Walking and Moving Around Current Status (F6213): At least 1 percent but less than 20 percent impaired, limited or restricted Mobility: Walking and Moving Around Goal Status [phone]): At least 1 percent but less than 20 percent impaired, limited or restricted Mobility: Walking and Moving Around Discharge Status [phone]): At least 1 percent but less than 20 percent impaired, limited or restricted    Time: 0950-1020 PT Time Calculation (min): 30 min   Charges:   PT Evaluation $Initial PT Evaluation Tier I: 1 Procedure     PT G Codes:   Functional Assessment Tool Used: clinical judgement Functional Limitation: Mobility: Walking and moving around    Lindy, Debarah Crape L 10/09/2013, 10:25 AM

## 2013-10-09 NOTE — Progress Notes (Signed)
*  PRELIMINARY RESULTS* Echocardiogram 2D Echocardiogram has been performed.  Jeryl Columbia 10/09/2013, 9:00 AM

## 2013-10-09 NOTE — Progress Notes (Signed)
Dementia workup essentially unrevealing. And deficiency workup revealed deficiency Hemoccult negative thus far hemoglobin 7.5 MRI MRA essentially within normal permanents for acute CVA or remote CVA and AMIREE NO [id number] DOB: Dec 17, 1930 DOA: 10/07/2013 PCP: [name], MD             Physical Exam: Blood pressure 160/65, pulse 63, temperature 98.6 F (37 C), temperature source Oral, resp. rate 18, height  (1.6 m), weight 153 lb (69.4 kg), SpO2 100.00%. JVD no carotid I operative unclamping or rhonchi appreciable heart murmur normal encounter of focal deficits noted   Investigations:  No results found for this or any previous visit (from the past 240 hour(s)).   Basic Metabolic Panel:  Recent Labs  43/32/95 0027  NA 138  K 5.0  CL 101  CO2 24  GLUCOSE 107*  BUN 33*  CREATININE 1.39*  CALCIUM 9.6   Liver Function Tests: No results found for this basename: AST, ALT, ALKPHOS, BILITOT, PROT, ALBUMIN,  in the last 72 hours   CBC:  Recent Labs  10/08/13 0027 10/08/13 1313  WBC 6.4 4.3  NEUTROABS 3.6  --   HGB 8.2* 7.5*  HCT 26.6* 24.6*  MCV 89.3 87.5  PLT 288 275    Dg Chest 2 View  10/08/2013   CLINICAL DATA:  Altered mental status and elevated blood pressure tonight  EXAM: CHEST  2 VIEW  COMPARISON:  None.  FINDINGS: Moderately severe cardiac enlargement. Vascular pattern is normal. Right lung is clear. Mild left lower lobe atelectasis. No consolidation or effusion. Calcification of the aorta.  IMPRESSION: Moderate cardiac enlargement.  No acute findings otherwise.   Electronically Signed   By: Esperanza Heir M.D.   On: 10/08/2013 02:15   Ct Head Wo Contrast  10/08/2013   CLINICAL DATA:  Altered mental status.  EXAM: CT HEAD WITHOUT CONTRAST  TECHNIQUE: Contiguous axial images were obtained from the base of the skull through the vertex without intravenous contrast.  COMPARISON:  None.  FINDINGS: Skull and Sinuses:Negative for fracture  or destructive process. The mastoids, middle ears, and imaged paranasal sinuses are clear.  Orbits: No acute abnormality.  Brain: No evidence of acute abnormality, such as acute infarction, hemorrhage, hydrocephalus, or mass lesion/mass effect. Typical senescent changes for age, including mild cerebral volume loss and mild small vessel ischemic injury around the lateral ventricles.  IMPRESSION: No acute intracranial findings.   Electronically Signed   By: Tiburcio Pea M.D.   On: 10/08/2013 02:27   Mr Brain Wo Contrast  10/08/2013   CLINICAL DATA:  Stroke.  Altered mental status.  EXAM: MRI HEAD WITHOUT CONTRAST  MRA HEAD WITHOUT CONTRAST  TECHNIQUE: Multiplanar, multiecho pulse sequences of the brain and surrounding structures were obtained without intravenous contrast. Angiographic images of the head were obtained using MRA technique without contrast.  COMPARISON:  Head CT 10/08/2013  FINDINGS: MRI HEAD FINDINGS  There is no evidence of acute infarct, intracranial hemorrhage, mass, midline shift, or extra-axial fluid collection. Small foci of T2 hyperintensity in the subcortical and deep cerebral white matter and pons are nonspecific but compatible with mild chronic small vessel ischemic disease. Ventricles and sulci are within normal limits for age.  Orbits are unremarkable. Mild mucosal thickening is present in the ethmoid air cells and maxillary sinuses. Mastoid air cells are clear. Major intracranial vascular flow voids are preserved.  MRA HEAD FINDINGS  The visualized distal vertebral arteries are patent and codominant. PICA origins, AICA origins, and SCA origins are patent.  Basilar artery is patent without stenosis. There are patent posterior communicating arteries bilaterally. 2 mm posteriorly directed outpouching from the right P1 segment likely represents a small aneurysm. There is mild bilateral PCA branch vessel irregularity.  There is irregularity of the vertical petrous segments of the internal  carotid arteries, moderate on the right and mild on the left with mild luminal narrowing on the right. ACAs are unremarkable. An anterior communicating artery is not clearly identified. No proximal MCA stenosis is seen. There is very mild bilateral MCA branch vessel irregularity.  IMPRESSION: 1. No evidence of acute intracranial abnormality. 2. Mild chronic small vessel ischemic disease. 3. No major intracranial arterial occlusion. Mild narrowing of the proximal intracranial right ICA. 4. 2 mm proximal right PCA aneurysm.   Electronically Signed   By: Sebastian Ache   On: 10/08/2013 15:11   Mr Maxine Glenn Head/brain Wo Cm  10/08/2013   CLINICAL DATA:  Stroke.  Altered mental status.  EXAM: MRI HEAD WITHOUT CONTRAST  MRA HEAD WITHOUT CONTRAST  TECHNIQUE: Multiplanar, multiecho pulse sequences of the brain and surrounding structures were obtained without intravenous contrast. Angiographic images of the head were obtained using MRA technique without contrast.  COMPARISON:  Head CT 10/08/2013  FINDINGS: MRI HEAD FINDINGS  There is no evidence of acute infarct, intracranial hemorrhage, mass, midline shift, or extra-axial fluid collection. Small foci of T2 hyperintensity in the subcortical and deep cerebral white matter and pons are nonspecific but compatible with mild chronic small vessel ischemic disease. Ventricles and sulci are within normal limits for age.  Orbits are unremarkable. Mild mucosal thickening is present in the ethmoid air cells and maxillary sinuses. Mastoid air cells are clear. Major intracranial vascular flow voids are preserved.  MRA HEAD FINDINGS  The visualized distal vertebral arteries are patent and codominant. PICA origins, AICA origins, and SCA origins are patent. Basilar artery is patent without stenosis. There are patent posterior communicating arteries bilaterally. 2 mm posteriorly directed outpouching from the right P1 segment likely represents a small aneurysm. There is mild bilateral PCA branch  vessel irregularity.  There is irregularity of the vertical petrous segments of the internal carotid arteries, moderate on the right and mild on the left with mild luminal narrowing on the right. ACAs are unremarkable. An anterior communicating artery is not clearly identified. No proximal MCA stenosis is seen. There is very mild bilateral MCA branch vessel irregularity.  IMPRESSION: 1. No evidence of acute intracranial abnormality. 2. Mild chronic small vessel ischemic disease. 3. No major intracranial arterial occlusion. Mild narrowing of the proximal intracranial right ICA. 4. 2 mm proximal right PCA aneurysm.   Electronically Signed   By: Sebastian Ache   On: 10/08/2013 15:11      Medications:   Impression: Progressive dementia over 12-18 months. Iron deficiency anemia Hemoccult-negative this time. Active Problems:   TIA (transient ischemic attack)     Plan: Continue Hemoccult testing. 2 and  units packed cells to the sternal blood flow and possibly cognition. Add iron 150 mg by mouth daily  Consultants:    Procedures   Antibiotics:                   Code Status: full   Family Communication:  I spoke with sister bedside today  Disposition Plan discharge with a 24-hour  Time spent: 30 minutes   LOS: 2 days   Amar Keenum M   10/09/2013, 2:07 PM

## 2013-10-10 DIAGNOSIS — G459 Transient cerebral ischemic attack, unspecified: Secondary | ICD-10-CM | POA: Diagnosis not present

## 2013-10-10 LAB — TYPE AND SCREEN
ABO/RH(D): O POS
Antibody Screen: NEGATIVE
UNIT DIVISION: 0
Unit division: 0

## 2013-10-10 LAB — URINALYSIS, ROUTINE W REFLEX MICROSCOPIC
Bilirubin Urine: NEGATIVE
GLUCOSE, UA: NEGATIVE mg/dL
KETONES UR: NEGATIVE mg/dL
LEUKOCYTES UA: NEGATIVE
Nitrite: NEGATIVE
PROTEIN: 100 mg/dL — AB
Specific Gravity, Urine: 1.02 (ref 1.005–1.030)
Urobilinogen, UA: 0.2 mg/dL (ref 0.0–1.0)
pH: 6 (ref 5.0–8.0)

## 2013-10-10 LAB — HEMOGLOBIN AND HEMATOCRIT, BLOOD
HCT: 34.4 % — ABNORMAL LOW (ref 36.0–46.0)
Hemoglobin: 11.1 g/dL — ABNORMAL LOW (ref 12.0–15.0)

## 2013-10-10 LAB — URINE MICROSCOPIC-ADD ON

## 2013-10-10 LAB — OCCULT BLOOD X 1 CARD TO LAB, STOOL: Fecal Occult Bld: NEGATIVE

## 2013-10-10 MED ORDER — ASPIRIN 325 MG PO TABS: 81.0000 mg | ORAL_TABLET | Freq: Every day | ORAL | Status: DC

## 2013-10-10 MED ORDER — POLYSACCHARIDE IRON COMPLEX 150 MG PO CAPS: 150.0000 mg | ORAL_CAPSULE | Freq: Every day | ORAL | Status: DC

## 2013-10-10 MED ORDER — ONDANSETRON HCL 4 MG/2ML IJ SOLN
4.0000 mg | INTRAMUSCULAR | Status: DC | PRN
  Administered 2013-10-10: 4 mg via INTRAVENOUS
  Filled 2013-10-10: qty 2

## 2013-10-10 NOTE — Procedures (Signed)
  [facility] Shelle Galdamez A. Gerilyn Pilgrim, MD     [email or web address]           HISTORY: The patient is an 80 year old female who presents with episodes of staring  right hand suspicious for seizure activity.   MEDICATIONS: Scheduled Meds: Continuous Infusions: PRN Meds:.    Prior to Admission medications   Medication Sig Start Date End Date Taking? Authorizing Provider  amLODipine (NORVASC) 5 MG tablet Take 5 mg by mouth daily.    Historical Provider, MD  aspirin 325 MG tablet Take 0.5 tablets (162.5 mg total) by mouth daily. 10/10/13   [name], MD  atorvastatin (LIPITOR) 20 MG tablet Take 20 mg by mouth daily.    Historical Provider, MD  cloNIDine (CATAPRES) 0.1 MG tablet Take 0.1 mg by mouth 2 (two) times daily.    Historical Provider, MD  iron polysaccharides (NIFEREX) 150 MG capsule Take 1 capsule (150 mg total) by mouth daily. 10/10/13   [name], MD  lisinopril-hydrochlorothiazide (PRINZIDE,ZESTORETIC) 20-12.5 MG per tablet Take 1 tablet by mouth daily.    Historical Provider, MD      ANALYSIS: A 16 channel recording using standard 10 20 measurements is conducted for 22 minutes. There is a posterior dominant rhythm of 7 Hz maximum. There is beta activity observed in the frontal areas. There is significant myogenic artifact noted. Awake and drowsy activities are observed. Photic simulation and hyperventilation not carried out. There is no focal or lateralized slowing. There is no epileptiform activity.    IMPRESSION: This recording shows mild generalized slowing. Otherwise, there are no epileptiform activities observed.      Sache Sane A. Gerilyn Pilgrim, M.D.  Diplomate, Biomedical engineer of Psychiatry and Neurology ( Neurology).

## 2013-10-10 NOTE — Progress Notes (Signed)
Notified Dr. Janna Arch due to the patients nausea and vomiting.  Recommended Zofran.  Voiced to him the newest H/H.  MD states okay to give ASA.  New orders given and followed.

## 2013-10-10 NOTE — Progress Notes (Signed)
Patient discharged with instructions, prescription, and care notes.  Verbalized understanding via teach back.  IV was removed and the site was WNL. Patient voiced no further complaints or concerns at the time of discharge.  Patient left the floor via w/c with staff and family in stable condition.  Tapered dose of Namenda given and explained how it should be taken.  She verbalized understanding.  I voiced to her if she had any complaints or concerns about t he medication to notify Dr. Janna Arch.

## 2013-10-10 NOTE — Evaluation (Signed)
Speech Language Pathology Evaluation Patient Details Name: Joyce Carpenter MRN: [id number] DOB: [birth year] Today's Date: 10/10/2013 Time: 4098-1191 SLP Time Calculation (min): 21 min  Problem List:  Patient Active Problem List   Diagnosis Date Noted  . TIA (transient ischemic attack) 10/08/2013   Past Medical History:  Past Medical History  Diagnosis Date  . Hypertension    Past Surgical History: History reviewed. No pertinent past surgical history. HPI:  Joyce Carpenter is a 80 y.o. female who was brought to the Emergency Department via EMS presenting with AMS according to her family. Per EMS records, the family reported two episodes of AMS this evening.  At the bedside, the pt states she was dreaming and upon awakening experienced difficulty verbalizing her sister's name even though she knew it. She denies a sense of paralysis upon awakening. The pt also states her family told her that she was shaking during the episode. She denies chest pain, SOB, nausea, diarrhea, or dysuria.   Assessment / Plan / Recommendation Clinical Impression  The pt currently demonstrates cognitive/ linguistic/ motor speech skills generally within functional limits with the exception of needing frequent clarification/ repetition of sentences. She said she saw someone about her hearing several years ago. She felt a unsure of specific results/ recommendations but reported that they told her her hearing was fine but that she had trouble with understanding. Needed repetition to appropriately complete receptive language tasks. Memory, reasoning, problem solving intact. The pt reported that sometimes she takes her medication "an hour too late"; discussed memory strategies to ensure that she does not forget- write down reminders to post on fridge, creating routine of taking them first thing in the morning if able, etc. Family members in room reported that she seems "back to normal" but that she has ongoing issues of  sometimes being confused with what other say to her; based on evaluation it seems likely that this is related to hearing/ processing difficulties. No further speech therapy recommended at this time but would recommend that pt have another visit to audiologist for further evaluation/information of hearing vs. processing deficits. Speech therapy may later be warranted for receptive language, but cannot make this recommendation until detailed information is acquired regarding hearing vs language processing. ST will sign off at this time; please re-consult as needs arise.    SLP Assessment  Patient does not need any further Speech Lanaguage Pathology Services    Follow Up Recommendations  None    Frequency and Duration        Pertinent Vitals/Pain Pain Assessment: No/denies pain   SLP Goals     SLP Evaluation Prior Functioning  Cognitive/Linguistic Baseline: Baseline deficits Baseline deficit details: reports hx of auditory processing deficits that began in her 67s Type of Home: House Available Help at Discharge: Family   Cognition  Overall Cognitive Status: Within Functional Limits for tasks assessed Arousal/Alertness: Awake/alert Orientation Level: Oriented X4 Attention: Selective Selective Attention: Appears intact Memory: Appears intact Awareness: Appears intact Problem Solving: Appears intact Safety/Judgment: Appears intact    Comprehension  Auditory Comprehension Overall Auditory Comprehension: Impaired at baseline Yes/No Questions: Within Functional Limits Commands: Impaired Two Step Basic Commands: 75-100% accurate Multistep Basic Commands: 50-74% accurate Conversation: Complex Other Conversation Comments: Pt asked for repetitions/ clarification frequently Interfering Components: Hearing;Processing speed EffectiveTechniques: Increased volume;Pausing;Repetition;Slowed speech;Visual/Gestural cues    Expression Expression Primary Mode of Expression: Verbal Verbal  Expression Overall Verbal Expression: Appears within functional limits for tasks assessed Initiation: No impairment Level of Generative/Spontaneous Verbalization: Conversation Repetition:  No impairment Naming: No impairment Pragmatics: No impairment Non-Verbal Means of Communication: Not applicable Written Expression Dominant Hand: Right   Oral / Motor Oral Motor/Sensory Function Overall Oral Motor/Sensory Function: Appears within functional limits for tasks assessed Motor Speech Overall Motor Speech: Appears within functional limits for tasks assessed   GO Functional Assessment Tool Used: clinical judgement Functional Limitations: Spoken language comprehension Spoken Language Comprehension Current Status ([id number]): At least 1 percent but less than 20 percent impaired, limited or restricted Spoken Language Comprehension Goal Status [phone]): At least 1 percent but less than 20 percent impaired, limited or restricted Spoken Language Comprehension Discharge Status [phone]): At least 1 percent but less than 20 percent impaired, limited or restricted   Metro Kung, [place], CCC-SLP 10/10/2013, 6:27 PM

## 2013-10-10 NOTE — Discharge Summary (Signed)
Physician Discharge Summary  Joyce Carpenter ZOX:[id number] DOB: 12-24-1930 DOA: 10/07/2013  PCP: [name], MD  Admit date: 10/07/2013 Discharge date: 10/10/2013   Recommendations for Outpatient Follow-up:  Patient will followup in my office within one week 26 hemoglobin and mental status Discharge Diagnoses:  Active Problems:   TIA (transient ischemic attack)   Discharge Condition: Good.  Filed Weights   10/08/13 0005  Weight: 153 lb (69.4 kg)    History of present illness:  Patient was admitted with altered mental status unable to participate in history been having alternating levels mental acuity over the preceding 12-18 months in the hospital dementia workup was unrevealing it significant normocytic anemia of iron deficiency concomitantly stool negative for occult blood transfuse 2 units and will be followed up her anemia as an outpatient.  Hospital Course:  Patient had negative workup with MRA MRI dementia workup positive for iron deficiency was always noted hemoglobin of 7.522 units with increased energy no dyspnea with CHF noted.  Procedures:     Consultations:  None.  Discharge Instructions     Medication List         amLODipine 5 MG tablet  Commonly known as:  NORVASC  Take 5 mg by mouth daily.     aspirin 325 MG tablet  Take 0.5 tablets (162.5 mg total) by mouth daily.     atorvastatin 20 MG tablet  Commonly known as:  LIPITOR  Take 20 mg by mouth daily.     cloNIDine 0.1 MG tablet  Commonly known as:  CATAPRES  Take 0.1 mg by mouth 2 (two) times daily.     iron polysaccharides 150 MG capsule  Commonly known as:  NIFEREX  Take 1 capsule (150 mg total) by mouth daily.     lisinopril-hydrochlorothiazide 20-12.5 MG per tablet  Commonly known as:  PRINZIDE,ZESTORETIC  Take 1 tablet by mouth daily.       No Known Allergies    The results of significant diagnostics from this hospitalization (including imaging, microbiology,  ancillary and laboratory) are listed below for reference.    Significant Diagnostic Studies: Dg Chest 2 View  10/08/2013   CLINICAL DATA:  Altered mental status and elevated blood pressure tonight  EXAM: CHEST  2 VIEW  COMPARISON:  None.  FINDINGS: Moderately severe cardiac enlargement. Vascular pattern is normal. Right lung is clear. Mild left lower lobe atelectasis. No consolidation or effusion. Calcification of the aorta.  IMPRESSION: Moderate cardiac enlargement.  No acute findings otherwise.   Electronically Signed   By: Esperanza Heir M.D.   On: 10/08/2013 02:15   Ct Head Wo Contrast  10/08/2013   CLINICAL DATA:  Altered mental status.  EXAM: CT HEAD WITHOUT CONTRAST  TECHNIQUE: Contiguous axial images were obtained from the base of the skull through the vertex without intravenous contrast.  COMPARISON:  None.  FINDINGS: Skull and Sinuses:Negative for fracture or destructive process. The mastoids, middle ears, and imaged paranasal sinuses are clear.  Orbits: No acute abnormality.  Brain: No evidence of acute abnormality, such as acute infarction, hemorrhage, hydrocephalus, or mass lesion/mass effect. Typical senescent changes for age, including mild cerebral volume loss and mild small vessel ischemic injury around the lateral ventricles.  IMPRESSION: No acute intracranial findings.   Electronically Signed   By: Tiburcio Pea M.D.   On: 10/08/2013 02:27   Mr Brain Wo Contrast  10/08/2013   CLINICAL DATA:  Stroke.  Altered mental status.  EXAM: MRI HEAD WITHOUT CONTRAST  MRA  HEAD WITHOUT CONTRAST  TECHNIQUE: Multiplanar, multiecho pulse sequences of the brain and surrounding structures were obtained without intravenous contrast. Angiographic images of the head were obtained using MRA technique without contrast.  COMPARISON:  Head CT 10/08/2013  FINDINGS: MRI HEAD FINDINGS  There is no evidence of acute infarct, intracranial hemorrhage, mass, midline shift, or extra-axial fluid collection. Small foci  of T2 hyperintensity in the subcortical and deep cerebral white matter and pons are nonspecific but compatible with mild chronic small vessel ischemic disease. Ventricles and sulci are within normal limits for age.  Orbits are unremarkable. Mild mucosal thickening is present in the ethmoid air cells and maxillary sinuses. Mastoid air cells are clear. Major intracranial vascular flow voids are preserved.  MRA HEAD FINDINGS  The visualized distal vertebral arteries are patent and codominant. PICA origins, AICA origins, and SCA origins are patent. Basilar artery is patent without stenosis. There are patent posterior communicating arteries bilaterally. 2 mm posteriorly directed outpouching from the right P1 segment likely represents a small aneurysm. There is mild bilateral PCA branch vessel irregularity.  There is irregularity of the vertical petrous segments of the internal carotid arteries, moderate on the right and mild on the left with mild luminal narrowing on the right. ACAs are unremarkable. An anterior communicating artery is not clearly identified. No proximal MCA stenosis is seen. There is very mild bilateral MCA branch vessel irregularity.  IMPRESSION: 1. No evidence of acute intracranial abnormality. 2. Mild chronic small vessel ischemic disease. 3. No major intracranial arterial occlusion. Mild narrowing of the proximal intracranial right ICA. 4. 2 mm proximal right PCA aneurysm.   Electronically Signed   By: Sebastian Ache   On: 10/08/2013 15:11   Mr Maxine Glenn Head/brain Wo Cm  10/08/2013   CLINICAL DATA:  Stroke.  Altered mental status.  EXAM: MRI HEAD WITHOUT CONTRAST  MRA HEAD WITHOUT CONTRAST  TECHNIQUE: Multiplanar, multiecho pulse sequences of the brain and surrounding structures were obtained without intravenous contrast. Angiographic images of the head were obtained using MRA technique without contrast.  COMPARISON:  Head CT 10/08/2013  FINDINGS: MRI HEAD FINDINGS  There is no evidence of acute  infarct, intracranial hemorrhage, mass, midline shift, or extra-axial fluid collection. Small foci of T2 hyperintensity in the subcortical and deep cerebral white matter and pons are nonspecific but compatible with mild chronic small vessel ischemic disease. Ventricles and sulci are within normal limits for age.  Orbits are unremarkable. Mild mucosal thickening is present in the ethmoid air cells and maxillary sinuses. Mastoid air cells are clear. Major intracranial vascular flow voids are preserved.  MRA HEAD FINDINGS  The visualized distal vertebral arteries are patent and codominant. PICA origins, AICA origins, and SCA origins are patent. Basilar artery is patent without stenosis. There are patent posterior communicating arteries bilaterally. 2 mm posteriorly directed outpouching from the right P1 segment likely represents a small aneurysm. There is mild bilateral PCA branch vessel irregularity.  There is irregularity of the vertical petrous segments of the internal carotid arteries, moderate on the right and mild on the left with mild luminal narrowing on the right. ACAs are unremarkable. An anterior communicating artery is not clearly identified. No proximal MCA stenosis is seen. There is very mild bilateral MCA branch vessel irregularity.  IMPRESSION: 1. No evidence of acute intracranial abnormality. 2. Mild chronic small vessel ischemic disease. 3. No major intracranial arterial occlusion. Mild narrowing of the proximal intracranial right ICA. 4. 2 mm proximal right PCA aneurysm.   Electronically Signed  By: Sebastian Ache   On: 10/08/2013 15:11    Microbiology: No results found for this or any previous visit (from the past 240 hour(s)).   Labs: Basic Metabolic Panel:  Recent Labs Lab 10/08/13 0027  NA 138  K 5.0  CL 101  CO2 24  GLUCOSE 107*  BUN 33*  CREATININE 1.39*  CALCIUM 9.6   Liver Function Tests: No results found for this basename: AST, ALT, ALKPHOS, BILITOT, PROT, ALBUMIN,  in  the last 168 hours No results found for this basename: LIPASE, AMYLASE,  in the last 168 hours  Recent Labs Lab 10/08/13 0216  AMMONIA 18   CBC:  Recent Labs Lab 10/08/13 0027 10/08/13 1313 10/10/13 0927  WBC 6.4 4.3  --   NEUTROABS 3.6  --   --   HGB 8.2* 7.5* 11.1*  HCT 26.6* 24.6* 34.4*  MCV 89.3 87.5  --   PLT 288 275  --    Cardiac Enzymes: No results found for this basename: CKTOTAL, CKMB, CKMBINDEX, TROPONINI,  in the last 168 hours BNP: BNP (last 3 results)  Recent Labs  10/08/13 0216  PROBNP 531.4*   CBG: No results found for this basename: GLUCAP,  in the last 168 hours     Signed:  [name]  [facility] Pager: [phone] 10/10/2013, 12:57 PM

## 2014-03-20 ENCOUNTER — Encounter (HOSPITAL_COMMUNITY): Payer: Self-pay | Admitting: Emergency Medicine

## 2014-03-20 ENCOUNTER — Inpatient Hospital Stay (HOSPITAL_COMMUNITY)
Admission: EM | Admit: 2014-03-20 | Discharge: 2014-03-27 | DRG: 354 | Disposition: A | Discharge status: Home Health | Payer: Medicare Other | Attending: Family Medicine | Admitting: Family Medicine

## 2014-03-20 ENCOUNTER — Emergency Department (HOSPITAL_COMMUNITY): Payer: Medicare Other

## 2014-03-20 ENCOUNTER — Other Ambulatory Visit (HOSPITAL_COMMUNITY): Payer: Self-pay

## 2014-03-20 DIAGNOSIS — E785 Hyperlipidemia, unspecified: Secondary | ICD-10-CM | POA: Diagnosis present

## 2014-03-20 DIAGNOSIS — D509 Iron deficiency anemia, unspecified: Secondary | ICD-10-CM | POA: Diagnosis present

## 2014-03-20 DIAGNOSIS — Z7982 Long term (current) use of aspirin: Secondary | ICD-10-CM

## 2014-03-20 DIAGNOSIS — R1110 Vomiting, unspecified: Secondary | ICD-10-CM

## 2014-03-20 DIAGNOSIS — K56609 Unspecified intestinal obstruction, unspecified as to partial versus complete obstruction: Secondary | ICD-10-CM

## 2014-03-20 DIAGNOSIS — E860 Dehydration: Secondary | ICD-10-CM | POA: Diagnosis present

## 2014-03-20 DIAGNOSIS — N183 Chronic kidney disease, stage 3 (moderate): Secondary | ICD-10-CM | POA: Diagnosis present

## 2014-03-20 DIAGNOSIS — I129 Hypertensive chronic kidney disease with stage 1 through stage 4 chronic kidney disease, or unspecified chronic kidney disease: Secondary | ICD-10-CM | POA: Diagnosis present

## 2014-03-20 DIAGNOSIS — R112 Nausea with vomiting, unspecified: Secondary | ICD-10-CM | POA: Diagnosis present

## 2014-03-20 DIAGNOSIS — F0280 Dementia in other diseases classified elsewhere without behavioral disturbance: Secondary | ICD-10-CM | POA: Diagnosis present

## 2014-03-20 DIAGNOSIS — G309 Alzheimer's disease, unspecified: Secondary | ICD-10-CM | POA: Diagnosis present

## 2014-03-20 DIAGNOSIS — D638 Anemia in other chronic diseases classified elsewhere: Secondary | ICD-10-CM | POA: Diagnosis present

## 2014-03-20 DIAGNOSIS — K420 Umbilical hernia with obstruction, without gangrene: Secondary | ICD-10-CM

## 2014-03-20 DIAGNOSIS — E780 Pure hypercholesterolemia: Secondary | ICD-10-CM | POA: Diagnosis present

## 2014-03-20 DIAGNOSIS — N179 Acute kidney failure, unspecified: Secondary | ICD-10-CM | POA: Diagnosis present

## 2014-03-20 DIAGNOSIS — I10 Essential (primary) hypertension: Secondary | ICD-10-CM | POA: Diagnosis present

## 2014-03-20 DIAGNOSIS — K56600 Partial intestinal obstruction, unspecified as to cause: Secondary | ICD-10-CM | POA: Diagnosis present

## 2014-03-20 DIAGNOSIS — E876 Hypokalemia: Secondary | ICD-10-CM | POA: Diagnosis present

## 2014-03-20 DIAGNOSIS — R109 Unspecified abdominal pain: Secondary | ICD-10-CM | POA: Diagnosis present

## 2014-03-20 DIAGNOSIS — K429 Umbilical hernia without obstruction or gangrene: Secondary | ICD-10-CM | POA: Diagnosis present

## 2014-03-20 DIAGNOSIS — N170 Acute kidney failure with tubular necrosis: Secondary | ICD-10-CM

## 2014-03-20 LAB — CBC WITH DIFFERENTIAL/PLATELET
BASOS ABS: 0 10*3/uL (ref 0.0–0.1)
BASOS PCT: 0 % (ref 0–1)
Eosinophils Absolute: 0.2 10*3/uL (ref 0.0–0.7)
Eosinophils Relative: 2 % (ref 0–5)
HCT: 24.2 % — ABNORMAL LOW (ref 36.0–46.0)
Hemoglobin: 7.7 g/dL — ABNORMAL LOW (ref 12.0–15.0)
Lymphocytes Relative: 17 % (ref 12–46)
Lymphs Abs: 1.4 10*3/uL (ref 0.7–4.0)
MCH: 30.2 pg (ref 26.0–34.0)
MCHC: 31.8 g/dL (ref 30.0–36.0)
MCV: 94.9 fL (ref 78.0–100.0)
Monocytes Absolute: 0.6 10*3/uL (ref 0.1–1.0)
Monocytes Relative: 8 % (ref 3–12)
Neutro Abs: 5.7 10*3/uL (ref 1.7–7.7)
Neutrophils Relative %: 73 % (ref 43–77)
PLATELETS: 284 10*3/uL (ref 150–400)
RBC: 2.55 MIL/uL — ABNORMAL LOW (ref 3.87–5.11)
RDW: 15.5 % (ref 11.5–15.5)
WBC: 7.9 10*3/uL (ref 4.0–10.5)

## 2014-03-20 LAB — COMPREHENSIVE METABOLIC PANEL
ALT: 18 U/L (ref 0–35)
AST: 30 U/L (ref 0–37)
Albumin: 3.3 g/dL — ABNORMAL LOW (ref 3.5–5.2)
Alkaline Phosphatase: 60 U/L (ref 39–117)
Anion gap: 9 (ref 5–15)
BUN: 66 mg/dL — ABNORMAL HIGH (ref 6–23)
CALCIUM: 10 mg/dL (ref 8.4–10.5)
CO2: 28 mmol/L (ref 19–32)
Chloride: 103 mmol/L (ref 96–112)
Creatinine, Ser: 2.7 mg/dL — ABNORMAL HIGH (ref 0.50–1.10)
GFR calc Af Amer: 18 mL/min — ABNORMAL LOW (ref >90)
GFR, EST NON AFRICAN AMERICAN: 15 mL/min — AB (ref >90)
Glucose, Bld: 122 mg/dL — ABNORMAL HIGH (ref 70–99)
Potassium: 4.9 mmol/L (ref 3.5–5.1)
SODIUM: 140 mmol/L (ref 135–145)
Total Bilirubin: 0.7 mg/dL (ref 0.3–1.2)
Total Protein: 6.8 g/dL (ref 6.0–8.3)

## 2014-03-20 LAB — RETICULOCYTES
RBC.: 2.56 MIL/uL — AB (ref 3.87–5.11)
Retic Count, Absolute: 71.7 10*3/uL (ref 19.0–186.0)
Retic Ct Pct: 2.8 % (ref 0.4–3.1)

## 2014-03-20 LAB — LIPASE, BLOOD: Lipase: 24 U/L (ref 11–59)

## 2014-03-20 MED ORDER — ONDANSETRON HCL 4 MG/2ML IJ SOLN
4.0000 mg | Freq: Once | INTRAMUSCULAR | Status: AC
  Administered 2014-03-20: 4 mg via INTRAVENOUS
  Filled 2014-03-20: qty 2

## 2014-03-20 MED ORDER — SODIUM CHLORIDE 0.9 % IV SOLN: Freq: Once | INTRAVENOUS | Status: DC

## 2014-03-20 MED ORDER — SODIUM CHLORIDE 0.9 % IV BOLUS (SEPSIS)
500.0000 mL | Freq: Once | INTRAVENOUS | Status: AC
  Administered 2014-03-20: 500 mL via INTRAVENOUS

## 2014-03-20 NOTE — ED Provider Notes (Signed)
CSN: [id number]     Arrival date & time 03/20/14  1447 History  This chart was scribed for Benny LennertJoseph L Tillmon Kisling, MD by Tonye RoyaltyJoshua Chen, ED Scribe. This patient was seen in room APA09/APA09 and the patient's care was started at 4:47 PM.    Chief Complaint  Patient presents with  . Abdominal Pain  . Emesis   Patient is a 81 y.o. female presenting with vomiting and cough. The history is provided by the patient. No language interpreter was used.  Emesis Severity:  Mild Duration:  2 days Able to tolerate:  Liquids and solids Progression:  Unchanged Chronicity:  New Recent urination:  Normal Context: post-tussive   Relieved by:  Nothing Exacerbated by: cough. Ineffective treatments:  None tried Associated symptoms: abdominal pain   Associated symptoms: no chills, no diarrhea and no headaches   Cough Severity:  Moderate Duration:  2 days Timing:  Constant Chronicity:  New Smoker: no   Relieved by:  Nothing Worsened by:  Nothing tried Ineffective treatments:  None tried Associated symptoms: no chest pain, no chills, no eye discharge, no fever, no headaches and no rash     HPI Comments: Joyce NashCatherine E Carpenter is a 81 y.o. female who presents to the Emergency Department complaining of coughing and subsequent comiting with onset 2 days ago. She reports associated abdominal pain when coughing. She denies fever or chills.  Past Medical History  Diagnosis Date  . Hypertension    History reviewed. No pertinent past surgical history. History reviewed. No pertinent family history. History  Substance Use Topics  . Smoking status: Never Smoker   . Smokeless tobacco: Not on file  . Alcohol Use: No   OB History    No data available     Review of Systems  Constitutional: Negative for fever, chills, appetite change and fatigue.  HENT: Negative for congestion, ear discharge and sinus pressure.   Eyes: Negative for discharge.  Respiratory: Positive for cough.   Cardiovascular: Negative for chest  pain.  Gastrointestinal: Positive for vomiting and abdominal pain. Negative for diarrhea.  Genitourinary: Negative for frequency and hematuria.  Musculoskeletal: Negative for back pain.  Skin: Negative for rash.  Neurological: Negative for seizures and headaches.  Psychiatric/Behavioral: Negative for hallucinations.      Allergies  Review of patient's allergies indicates no known allergies.  Home Medications   Prior to Admission medications   Medication Sig Start Date End Date Taking? Authorizing Provider  amLODipine (NORVASC) 5 MG tablet Take 5 mg by mouth daily.    Historical Provider, MD  aspirin 325 MG tablet Take 0.5 tablets (162.5 mg total) by mouth daily. 10/10/13   Isabella Stallingichard M Dondiego, MD  atorvastatin (LIPITOR) 20 MG tablet Take 20 mg by mouth daily.    Historical Provider, MD  cloNIDine (CATAPRES) 0.1 MG tablet Take 0.1 mg by mouth 2 (two) times daily.    Historical Provider, MD  iron polysaccharides (NIFEREX) 150 MG capsule Take 1 capsule (150 mg total) by mouth daily. 10/10/13   Isabella Stallingichard M Dondiego, MD  lisinopril-hydrochlorothiazide (PRINZIDE,ZESTORETIC) 20-12.5 MG per tablet Take 1 tablet by mouth daily.    Historical Provider, MD   BP 135/56 mmHg  Pulse 77  Temp(Src) 97.5 F (36.4 C) (Oral)  Resp 16  SpO2 100% Physical Exam  Constitutional: She is oriented to person, place, and time. She appears well-developed.  HENT:  Head: Normocephalic.  Eyes: Conjunctivae and EOM are normal. No scleral icterus.  Neck: Neck supple. No thyromegaly present.  Cardiovascular: Normal  rate and regular rhythm.  Exam reveals no gallop and no friction rub.   No murmur heard. Pulmonary/Chest: No stridor. She has no wheezes. She has no rales. She exhibits no tenderness.  Abdominal: She exhibits no distension. There is no tenderness. There is no rebound.  Large ventral hernia, reduceable and nontender  Musculoskeletal: Normal range of motion. She exhibits no edema.  Lymphadenopathy:     She has no cervical adenopathy.  Neurological: She is oriented to person, place, and time. She exhibits normal muscle tone. Coordination normal.  Skin: No rash noted. No erythema.  Psychiatric: She has a normal mood and affect. Her behavior is normal.    ED Course  Procedures (including critical care time)  DIAGNOSTIC STUDIES:   COORDINATION OF CARE: 4:49 PM Discussed treatment plan with patient at beside, the patient agrees with the plan and has no further questions at this time.  Labs Review Labs Reviewed  CBC WITH DIFFERENTIAL/PLATELET - Abnormal; Notable for the following:    RBC 2.55 (*)    Hemoglobin 7.7 (*)    HCT 24.2 (*)    All other components within normal limits  COMPREHENSIVE METABOLIC PANEL  LIPASE, BLOOD    Imaging Review No results found.   EKG Interpretation None      MDM   Final diagnoses:  None    Admit for sbo.  Surgeon consulted   The chart was scribed for me under my direct supervision.  I personally performed the history, physical, and medical decision making and all procedures in the evaluation of this patient.Benny Lennert, MD 03/20/14 [phone]

## 2014-03-20 NOTE — ED Notes (Signed)
Patient complaining of abdominal pain and vomiting x 2 days. Patient states "I feel good, but my stomach just hurts."

## 2014-03-21 ENCOUNTER — Encounter (HOSPITAL_COMMUNITY): Payer: Self-pay | Admitting: *Deleted

## 2014-03-21 DIAGNOSIS — R112 Nausea with vomiting, unspecified: Secondary | ICD-10-CM

## 2014-03-21 DIAGNOSIS — I10 Essential (primary) hypertension: Secondary | ICD-10-CM | POA: Insufficient documentation

## 2014-03-21 DIAGNOSIS — K429 Umbilical hernia without obstruction or gangrene: Secondary | ICD-10-CM

## 2014-03-21 DIAGNOSIS — R100 Acute abdomen: Secondary | ICD-10-CM

## 2014-03-21 DIAGNOSIS — K5669 Other intestinal obstruction: Secondary | ICD-10-CM

## 2014-03-21 DIAGNOSIS — D509 Iron deficiency anemia, unspecified: Secondary | ICD-10-CM

## 2014-03-21 DIAGNOSIS — N179 Acute kidney failure, unspecified: Secondary | ICD-10-CM

## 2014-03-21 LAB — OCCULT BLOOD X 1 CARD TO LAB, STOOL: Fecal Occult Bld: NEGATIVE

## 2014-03-21 LAB — RETICULOCYTES
RBC.: 2.25 MIL/uL — AB (ref 3.87–5.11)
RETIC CT PCT: 2.8 % (ref 0.4–3.1)
Retic Count, Absolute: 63 10*3/uL (ref 19.0–186.0)

## 2014-03-21 LAB — IRON AND TIBC
IRON: 13 ug/dL — AB (ref 42–145)
SATURATION RATIOS: 6 % — AB (ref 20–55)
TIBC: 219 ug/dL — ABNORMAL LOW (ref 250–470)
UIBC: 206 ug/dL (ref 125–400)
UIBC: 178 ug/dL (ref 125–400)

## 2014-03-21 LAB — CBC
HCT: 23.4 % — ABNORMAL LOW (ref 36.0–46.0)
Hemoglobin: 7.4 g/dL — ABNORMAL LOW (ref 12.0–15.0)
MCH: 30.1 pg (ref 26.0–34.0)
MCHC: 31.6 g/dL (ref 30.0–36.0)
MCV: 95.1 fL (ref 78.0–100.0)
Platelets: 270 10*3/uL (ref 150–400)
RBC: 2.46 MIL/uL — AB (ref 3.87–5.11)
RDW: 15.4 % (ref 11.5–15.5)
WBC: 4.9 10*3/uL (ref 4.0–10.5)

## 2014-03-21 LAB — BASIC METABOLIC PANEL
ANION GAP: 8 (ref 5–15)
BUN: 78 mg/dL — AB (ref 6–23)
CO2: 27 mmol/L (ref 19–32)
CREATININE: 2.95 mg/dL — AB (ref 0.50–1.10)
Calcium: 9.2 mg/dL (ref 8.4–10.5)
Chloride: 104 mmol/L (ref 96–112)
GFR calc Af Amer: 16 mL/min — ABNORMAL LOW (ref >90)
GFR, EST NON AFRICAN AMERICAN: 14 mL/min — AB (ref >90)
GLUCOSE: 110 mg/dL — AB (ref 70–99)
POTASSIUM: 4.4 mmol/L (ref 3.5–5.1)
Sodium: 139 mmol/L (ref 135–145)

## 2014-03-21 MED ORDER — ONDANSETRON HCL 4 MG/2ML IJ SOLN
4.0000 mg | Freq: Four times a day (QID) | INTRAMUSCULAR | Status: DC | PRN
  Administered 2014-03-21 – 2014-03-23 (×4): 4 mg via INTRAVENOUS
  Filled 2014-03-21 (×4): qty 2

## 2014-03-21 MED ORDER — SODIUM CHLORIDE 0.9 % IV SOLN
INTRAVENOUS | Status: AC
  Administered 2014-03-21: 02:00:00 via INTRAVENOUS

## 2014-03-21 MED ORDER — ONDANSETRON HCL 4 MG PO TABS
4.0000 mg | ORAL_TABLET | Freq: Four times a day (QID) | ORAL | Status: DC | PRN
Start: 2014-03-21 — End: 2014-03-27

## 2014-03-21 MED ORDER — SODIUM CHLORIDE 0.9 % IV SOLN
INTRAVENOUS | Status: DC
  Administered 2014-03-21 – 2014-03-23 (×5): via INTRAVENOUS

## 2014-03-21 MED ORDER — HYDROMORPHONE HCL 1 MG/ML IJ SOLN: 1.0000 mg | INTRAMUSCULAR | Status: DC | PRN

## 2014-03-21 NOTE — Progress Notes (Signed)
Patient ordered NG tube.  Patient has not vomited, only expectorated small amounts of sputum.  MD aware of patient situation.  MD stated that NG tube could be left out, unless patient begins to vomit.

## 2014-03-21 NOTE — Progress Notes (Signed)
INITIAL NUTRITION ASSESSMENT  DOCUMENTATION CODES Per approved criteria  -Not Applicable   INTERVENTION: -Once diet advanced, monitor oral intake and add snacks/supps as warranted  -Iron supp in setting of anemia  NUTRITION DIAGNOSIS: Altered GI function related to hernia as evidenced by reported abdominal pain, decreased appetite, nausea and vomiting past couple days  Goal: Diet advanced within 24 hours after surgery.  Pt to meet >/= 90% of their estimated nutrition needs   Monitor:  Diet status, procedures, oral intake, better wt/oral intake hx  Reason for Assessment: MST of 3  81 y.o. female  Admitting Dx: Partial small bowel obstruction  ASSESSMENT: 81 yo pleasant healthy female h/o htn and periumbilical hernia comes in with worsening periumbilical abd pain throughout the day with associated nausea and nonbloody vomiting.  Per notes: Pt has had n/v and decreased appetite past couple of days. Pt reported as being very active  Pt will need surgical repair of hernia  Possible AKI vs ATN  Height: Ht Readings from Last 1 Encounters:  03/20/14 5\' 3"  (1.6 m)    Weight: Wt Readings from Last 1 Encounters:  03/21/14 126 lb 9.6 oz (57.425 kg)    Ideal Body Weight: 115  % Ideal Body Weight: 110%  Wt Readings from Last 10 Encounters:  03/21/14 126 lb 9.6 oz (57.425 kg)  10/08/13 153 lb (69.4 kg)  Sig wt loss in 6 months  Usual Body Weight: Unknown  BMI:  Body mass index is 22.43 kg/(m^2).  Estimated Nutritional Needs: Kcal: 1600-1850 (28-32 kcal/kg) Protein: 69-80 (1.2-1.4g/kg) Fluid: 1.6-1.85 liters  Skin: n/a  Diet Order: Diet NPO time specified  EDUCATION NEEDS: -No education needs identified at this time   Intake/Output Summary (Last 24 hours) at 03/21/14 1132 Last data filed at 03/21/14 0900  Gross per 24 hour  Intake      0 ml  Output      0 ml  Net      0 ml    Last BM: 3/5  Labs:   Recent Labs Lab 03/20/14 1602 03/21/14 0611   NA 140 139  K 4.9 4.4  CL 103 104  CO2 28 27  BUN 66* 78*  CREATININE 2.70* 2.95*  CALCIUM 10.0 9.2  GLUCOSE 122* 110*    CBG (last 3)  No results for input(s): GLUCAP in the last 72 hours.  Scheduled Meds:   Continuous Infusions: . sodium chloride 75 mL/hr at 03/21/14 0135    Past Medical History  Diagnosis Date  . Hypertension     History reviewed. No pertinent past surgical history.  Christophe LouisNathan Franks RD, LDN Nutrition Pager: [phone] 03/21/2014 11:32 AM

## 2014-03-21 NOTE — H&P (Signed)
PCP:   [name], MD   Chief Complaint:  abd pain/n/v  HPI: 81 yo pleasant healthy female h/o htn and periumbilical hernia comes in with worsening periumbilical abd pain throughout the day with associated nausea and nonbloody vomiting.  Her hernia has gotten bigger today, she says it "normally goes down" and is never this big.  Denies any fevers.  No diarrhea.  Positive flatus.  She lives with her sister and is very active.  Her hernia is reducible and ct scan shows psbo caused by the hernia.  She has had one episode of vomiting since arrival to the ED, and her pain is improved.  No overt bleeding history.  Review of Systems:  Positive and negative as per HPI otherwise all other systems are negative  Past Medical History: Past Medical History  Diagnosis Date  . Hypertension    History reviewed. No pertinent past surgical history.  Medications: Prior to Admission medications   Medication Sig Start Date End Date Taking? Authorizing Provider  amLODipine (NORVASC) 5 MG tablet Take 5 mg by mouth daily.   Yes Historical Provider, MD  aspirin EC 81 MG tablet Take 81 mg by mouth daily.   Yes Historical Provider, MD  atorvastatin (LIPITOR) 20 MG tablet Take 20 mg by mouth daily.   Yes Historical Provider, MD  cloNIDine (CATAPRES) 0.1 MG tablet Take 0.1 mg by mouth 2 (two) times daily.   Yes Historical Provider, MD  ferrous sulfate 325 (65 FE) MG tablet Take 325 mg by mouth daily with breakfast.   Yes Historical Provider, MD  lisinopril-hydrochlorothiazide (PRINZIDE,ZESTORETIC) 20-12.5 MG per tablet Take 1 tablet by mouth daily.   Yes Historical Provider, MD  Multiple Vitamins-Minerals ([employer] SPECTRAVITE ADULT 50+) TABS Take 1 tablet by mouth daily.   Yes Historical Provider, MD  naproxen (NAPROSYN) 500 MG tablet Take 500 mg by mouth 2 (two) times daily as needed for mild pain or moderate pain.   Yes Historical Provider, MD    Allergies:  No Known Allergies  Social History:  reports that she has never smoked. She does not have any smokeless tobacco history on file. She reports that she does not drink alcohol or use illicit drugs.  Family History: History reviewed. No pertinent family history.  Physical Exam: Filed Vitals:   03/20/14 2100 03/20/14 2130 03/20/14 2331 03/20/14 2355  BP: 134/53 140/66 117/51 143/76  Pulse: 70 76 72 82  Temp:   98.7 F (37.1 C) 99.1 F (37.3 C)  TempSrc:   Oral Oral  Resp: Height:     (1.6 m)  Weight:    57.425 kg (126 lb 9.6 oz)  SpO2: 100% 100% 98% 100%   General appearance: alert, cooperative and no distress Head: Normocephalic, without obvious abnormality, atraumatic Eyes: negative Nose: Nares normal. Septum midline. Mucosa normal. No drainage or sinus tenderness. Neck: no JVD and supple, symmetrical, trachea midline Lungs: clear to auscultation bilaterally Heart: regular rate and rhythm, S1, S2 normal, no murmur, click, rub or gallop Abdomen: soft, nd large periumbilical hernia with loops of bowel easily reducible and nonpainful to palp pos bowel sounds  no r/g nonacute abdomen  Extremities: extremities normal, atraumatic, no cyanosis or edema Pulses: 2+ and symmetric Skin: Skin color, texture, turgor normal. No rashes or lesions Neurologic: Grossly normal   Labs on Admission:   Recent Labs  03/20/14 1602  NA 140  K 4.9  CL 103  CO2 28  GLUCOSE 122*  BUN 66*  CREATININE 2.70*  CALCIUM 10.0    Recent Labs  03/20/14 1602  AST 30  ALT 18  ALKPHOS 60  BILITOT 0.7  PROT 6.8  ALBUMIN 3.3*    Recent Labs  03/20/14 1602  LIPASE 24    Recent Labs  03/20/14 1602  WBC 7.9  NEUTROABS 5.7  HGB 7.7*  HCT 24.2*  MCV 94.9  PLT 284    Recent Labs  03/20/14 1602  RETICCTPCT 2.8    Radiological Exams on Admission: Ct Abdomen Pelvis Wo Contrast  03/20/2014   CLINICAL DATA:  Small bowel obstruction. Vomiting. Abdominal pain. Large ventral hernia.  EXAM: CT ABDOMEN AND PELVIS  WITHOUT CONTRAST  TECHNIQUE: Multidetector CT imaging of the abdomen and pelvis was performed following the standard protocol without IV contrast.  COMPARISON:  None.  FINDINGS: Lower chest:  Unremarkable.  Hepatobiliary: No mass visualized on this unenhanced exam. High attenuation sludge or tiny stones seen within the gallbladder. No evidence of cholecystitis.  Pancreas: No mass or inflammatory process visualized on this unenhanced exam.  Spleen:  Within normal limits in size.  Adrenal Glands:  No masses identified.  Kidneys/Urinary tract: No evidence of urolithiasis or hydronephrosis.  Stomach/Bowel/Peritoneum: Multiple fluid-filled dilated small bowel loops are seen within the abdomen and pelvis. A transition point is seen at a moderate size periumbilical hernia containing multiple small bowel loops. No evidence of small bowel wall thickening or pneumatosis. Diffuse mesenteric and body wall edema noted, but no focal inflammatory process or abscess identified.  Vascular/Lymphatic: No pathologically enlarged lymph nodes identified. No other significant abnormality identified.  Reproductive: Uterine fibroids seen, largest measuring approximately 5 cm.  Other:  None.  Musculoskeletal:  No suspicious bone lesions identified.  IMPRESSION: Distal small bowel obstruction due to periumbilical hernia containing several small bowel loops.  Gallbladder sludge versus tiny gallstones. No radiographic evidence of acute cholecystitis.  Diffuse mesenteric and body wall edema.  Uterine fibroids.   Electronically Signed   By: [name]  Stahl M.D.   On: 03/20/2014 20:51   Dg Abd Acute W/chest  03/20/2014   CLINICAL DATA:  Vomiting, central abdominal pain, abdominal distension, and cough for 2 days, history hypertension  EXAM: ACUTE ABDOMEN SERIES (ABDOMEN 2 VIEW & CHEST 1 VIEW)  COMPARISON:  Chest radiograph 10/08/2013  FINDINGS: Enlargement of cardiac silhouette.  Atherosclerotic calcification aorta.  Mediastinal contours and  pulmonary vascularity normal.  Lungs clear.  No pleural effusion or pneumothorax.  Multiple air-filled dilated loops of small bowel with paucity of colonic gas consistent with small bowel obstruction.  No bowel wall thickening or free intraperitoneal air.  Mild gastric distention noted as well.  Numerous pelvic phleboliths.  Degenerative disc disease changes lumbar spine.  IMPRESSION: Small bowel obstruction.  Enlargement of cardiac silhouette.   Electronically Signed   By: Ulyses SouthwardMark  Boles M.D.   On: 03/20/2014 17:44    Assessment/Plan  81 yo healthy female with psbo secondary to large periumbical hernia reducible at this time  Principal Problem:   Partial small bowel obstruction-  Will place ngt if vomits any further.  Conservative tx, dr Lovell Sheehanjenkins notified and will see in am., will likely need surgical repair of the hernia.  Keep npo and hold lovenox until seen by general surgery.  Ivf.   Active Problems:   Hypertension-  stable  peri Umbilical hernia as above   Nausea & vomiting   Abdominal pain, acute   Iron deficiency anemia- at her baseline, but less than 8, may need to be transfused  if plan is for surgical intervention.   Acute renal failure-  ivf , prerenal.    Admit to med surg.  Highly likely will need surgery in next couple of days.     Matha Masse A 03/21/2014, 12:02 AM

## 2014-03-21 NOTE — Progress Notes (Signed)
Yesterday has periumbilical hernia which is non-reducible by me this morning. CT scan shows multiple air-fluid levels consistent with partial SBO without definite evidence of incarceration surgery consult will be obtained this a.Carpenter. question is anemic and in renal failure with a creatinine of 2.95 she will have serial hemoglobin and hematocrits as well as assessment of renal function daily in no apparent distress and nothing by mouth at present. Joyce NashCatherine E Carpenter ZOX:[id number] DOB: 12-03-1930 DOA: 03/20/2014 PCP: Isabella StallingNDIEGO,Joyce Losada M, MD             Physical Exam: Blood pressure 159/63, pulse 92, temperature 98.7 F (37.1 C), temperature source Oral, resp. rate 18, height 5\' 3"  (1.6 Carpenter), weight 126 lb 9.6 oz (57.425 kg), SpO2 100 %. neck no JVD no carotid bruits no thyromegaly lungs clear to A&P no rales wheeze rhonchi heart regular rhythm no S3-S4 no heaves thrills rubs abdomen protuberant bowel sounds normoactive no peristaltic rushes nonreducible. Ventricular area umbilical hernia extremities 1+ chronic pitting edema   Investigations:  No results found for this or any previous visit (from the past 240 hour(s)).   Basic Metabolic Panel:  Recent Labs  81/19/1401/05/01 1602 03/21/14 0611  NA 140 139  K 4.9 4.4  CL 103 104  CO2 28 27  GLUCOSE 122* 110*  BUN 66* 78*  CREATININE 2.70* 2.95*  CALCIUM 10.0 9.2   Liver Function Tests:  Recent Labs  03/20/14 1602  AST 30  ALT 18  ALKPHOS 60  BILITOT 0.7  PROT 6.8  ALBUMIN 3.3*     CBC:  Recent Labs  03/20/14 1602 03/21/14 0611  WBC 7.9 4.9  NEUTROABS 5.7  --   HGB 7.7* 7.4*  HCT 24.2* 23.4*  MCV 94.9 95.1  PLT 284 270    Ct Abdomen Pelvis Wo Contrast  03/20/2014   CLINICAL DATA:  Small bowel obstruction. Vomiting. Abdominal pain. Large ventral hernia.  EXAM: CT ABDOMEN AND PELVIS WITHOUT CONTRAST  TECHNIQUE: Multidetector CT imaging of the abdomen and pelvis was performed following the standard protocol without IV  contrast.  COMPARISON:  None.  FINDINGS: Lower chest:  Unremarkable.  Hepatobiliary: No mass visualized on this unenhanced exam. High attenuation sludge or tiny stones seen within the gallbladder. No evidence of cholecystitis.  Pancreas: No mass or inflammatory process visualized on this unenhanced exam.  Spleen:  Within normal limits in size.  Adrenal Glands:  No masses identified.  Kidneys/Urinary tract: No evidence of urolithiasis or hydronephrosis.  Stomach/Bowel/Peritoneum: Multiple fluid-filled dilated small bowel loops are seen within the abdomen and pelvis. A transition point is seen at a moderate size periumbilical hernia containing multiple small bowel loops. No evidence of small bowel wall thickening or pneumatosis. Diffuse mesenteric and body wall edema noted, but no focal inflammatory process or abscess identified.  Vascular/Lymphatic: No pathologically enlarged lymph nodes identified. No other significant abnormality identified.  Reproductive: Uterine fibroids seen, largest measuring approximately 5 cm.  Other:  None.  Musculoskeletal:  No suspicious bone lesions identified.  IMPRESSION: Distal small bowel obstruction due to periumbilical hernia containing several small bowel loops.  Gallbladder sludge versus tiny gallstones. No radiographic evidence of acute cholecystitis.  Diffuse mesenteric and body wall edema.  Uterine fibroids.   Electronically Signed   By: [name]  Stahl Carpenter.D.   On: 03/20/2014 20:51   Dg Abd Acute W/chest  03/20/2014   CLINICAL DATA:  Vomiting, central abdominal pain, abdominal distension, and cough for 2 days, history hypertension  EXAM: ACUTE ABDOMEN SERIES (ABDOMEN 2 VIEW &  CHEST 1 VIEW)  COMPARISON:  Chest radiograph 10/08/2013  FINDINGS: Enlargement of cardiac silhouette.  Atherosclerotic calcification aorta.  Mediastinal contours and pulmonary vascularity normal.  Lungs clear.  No pleural effusion or pneumothorax.  Multiple air-filled dilated loops of small bowel with paucity  of colonic gas consistent with small bowel obstruction.  No bowel wall thickening or free intraperitoneal air.  Mild gastric distention noted as well.  Numerous pelvic phleboliths.  Degenerative disc disease changes lumbar spine.  IMPRESSION: Small bowel obstruction.  Enlargement of cardiac silhouette.   Electronically Signed   By: Joyce Carpenter Carpenter.D.   On: 03/20/2014 17:44      Medications:   Impression: Chronic renal failure  Principal Problem:   Partial small bowel obstruction Active Problems:   Hypertension   Umbilical hernia   Nausea & vomiting   Abdominal pain, acute   Iron deficiency anemia   Acute renal failure   Essential hypertension     Plan: Serial monitoring of hemoglobin and hematocrit stools for occult blood and renal function iron studies surgical consultation patient is to be kept nothing by mouth.   Consultants: Surgery   Procedures   Antibiotics:                   Code Status: Full  Family Communication:    Disposition Plan serial monitoring of lower hematocrit stools recall blood renal functions and surgical consultation as patient is nothing by mouth  Time spent: 30 minutes   LOS: 1 day   Joyce Carpenter Carpenter   03/21/2014, 9:33 AM

## 2014-03-21 NOTE — Consult Note (Signed)
Reason for Consult: Acute kidney injury Referring Physician: Dr. Oneida Arenas is an 81 y.o. female.  HPI: Patient with history of hypertension, iron deficiency anemia and possibly chronic renal failure stage III presently came with abdominal pain, nausea, vomiting and abdominal hernia distention. According the patient she has been coughing for a bout a couple of weeks without any sputum production. The last couple of days however she started having hernia increasing and becoming tight. She was having also some nausea and vomiting. Her appetite has declined. Presently she denies any pain but states that is still she has a fullness and distention. She did move also her bowel.  Past Medical History  Diagnosis Date  . Hypertension     History reviewed. No pertinent past surgical history.  History reviewed. No pertinent family history.  Social History:  reports that she has never smoked. She does not have any smokeless tobacco history on file. She reports that she does not drink alcohol or use illicit drugs.  Allergies: No Known Allergies  Medications: I have reviewed the patient's current medications.  Results for orders placed or performed during the hospital encounter of 03/20/14 (from the past 48 hour(s))  Comprehensive metabolic panel     Status: Abnormal   Collection Time: 03/20/14  4:02 PM  Result Value Ref Range   Sodium 140 135 - 145 mmol/L   Potassium 4.9 3.5 - 5.1 mmol/L   Chloride 103 96 - 112 mmol/L   CO2 28 19 - 32 mmol/L   Glucose, Bld 122 (H) 70 - 99 mg/dL   BUN 66 (H) 6 - 23 mg/dL   Creatinine, Ser 2.70 (H) 0.50 - 1.10 mg/dL   Calcium 10.0 8.4 - 10.5 mg/dL   Total Protein 6.8 6.0 - 8.3 g/dL   Albumin 3.3 (L) 3.5 - 5.2 g/dL   AST 30 0 - 37 U/L   ALT 18 0 - 35 U/L   Alkaline Phosphatase 60 39 - 117 U/L   Total Bilirubin 0.7 0.3 - 1.2 mg/dL   GFR calc non Af Amer 15 (L) >90 mL/min   GFR calc Af Amer 18 (L) >90 mL/min    Comment: (NOTE) The eGFR has  been calculated using the CKD EPI equation. This calculation has not been validated in all clinical situations. eGFR's persistently <90 mL/min signify possible Chronic Kidney Disease.    Anion gap 9 5 - 15  CBC with Differential     Status: Abnormal   Collection Time: 03/20/14  4:02 PM  Result Value Ref Range   WBC 7.9 4.0 - 10.5 K/uL   RBC 2.55 (L) 3.87 - 5.11 MIL/uL   Hemoglobin 7.7 (L) 12.0 - 15.0 g/dL   HCT 24.2 (L) 36.0 - 46.0 %   MCV 94.9 78.0 - 100.0 fL   MCH 30.2 26.0 - 34.0 pg   MCHC 31.8 30.0 - 36.0 g/dL   RDW 15.5 11.5 - 15.5 %   Platelets 284 150 - 400 K/uL   Neutrophils Relative % 73 43 - 77 %   Neutro Abs 5.7 1.7 - 7.7 K/uL   Lymphocytes Relative 17 12 - 46 %   Lymphs Abs 1.4 0.7 - 4.0 K/uL   Monocytes Relative 8 3 - 12 %   Monocytes Absolute 0.6 0.1 - 1.0 K/uL   Eosinophils Relative 2 0 - 5 %   Eosinophils Absolute 0.2 0.0 - 0.7 K/uL   Basophils Relative 0 0 - 1 %   Basophils Absolute 0.0 0.0 -  0.1 K/uL  Lipase, blood     Status: None   Collection Time: 03/20/14  4:02 PM  Result Value Ref Range   Lipase 24 11 - 59 U/L  Reticulocytes     Status: Abnormal   Collection Time: 03/20/14  4:02 PM  Result Value Ref Range   Retic Ct Pct 2.8 0.4 - 3.1 %   RBC. 2.56 (L) 3.87 - 5.11 MIL/uL   Retic Count, Manual 71.7 19.0 - 186.0 K/uL  Type and screen     Status: None   Collection Time: 03/21/14  1:27 AM  Result Value Ref Range   ABO/RH(D) O POS    Antibody Screen NEG    Sample Expiration 68/12/7515   Basic metabolic panel     Status: Abnormal   Collection Time: 03/21/14  6:11 AM  Result Value Ref Range   Sodium 139 135 - 145 mmol/L   Potassium 4.4 3.5 - 5.1 mmol/L   Chloride 104 96 - 112 mmol/L   CO2 27 19 - 32 mmol/L   Glucose, Bld 110 (H) 70 - 99 mg/dL   BUN 78 (H) 6 - 23 mg/dL   Creatinine, Ser 2.95 (H) 0.50 - 1.10 mg/dL   Calcium 9.2 8.4 - 10.5 mg/dL   GFR calc non Af Amer 14 (L) >90 mL/min   GFR calc Af Amer 16 (L) >90 mL/min    Comment: (NOTE) The  eGFR has been calculated using the CKD EPI equation. This calculation has not been validated in all clinical situations. eGFR's persistently <90 mL/min signify possible Chronic Kidney Disease.    Anion gap 8 5 - 15  CBC     Status: Abnormal   Collection Time: 03/21/14  6:11 AM  Result Value Ref Range   WBC 4.9 4.0 - 10.5 K/uL   RBC 2.46 (L) 3.87 - 5.11 MIL/uL   Hemoglobin 7.4 (L) 12.0 - 15.0 g/dL   HCT 23.4 (L) 36.0 - 46.0 %   MCV 95.1 78.0 - 100.0 fL   MCH 30.1 26.0 - 34.0 pg   MCHC 31.6 30.0 - 36.0 g/dL   RDW 15.4 11.5 - 15.5 %   Platelets 270 150 - 400 K/uL    Ct Abdomen Pelvis Wo Contrast  03/20/2014   CLINICAL DATA:  Small bowel obstruction. Vomiting. Abdominal pain. Large ventral hernia.  EXAM: CT ABDOMEN AND PELVIS WITHOUT CONTRAST  TECHNIQUE: Multidetector CT imaging of the abdomen and pelvis was performed following the standard protocol without IV contrast.  COMPARISON:  None.  FINDINGS: Lower chest:  Unremarkable.  Hepatobiliary: No mass visualized on this unenhanced exam. High attenuation sludge or tiny stones seen within the gallbladder. No evidence of cholecystitis.  Pancreas: No mass or inflammatory process visualized on this unenhanced exam.  Spleen:  Within normal limits in size.  Adrenal Glands:  No masses identified.  Kidneys/Urinary tract: No evidence of urolithiasis or hydronephrosis.  Stomach/Bowel/Peritoneum: Multiple fluid-filled dilated small bowel loops are seen within the abdomen and pelvis. A transition point is seen at a moderate size periumbilical hernia containing multiple small bowel loops. No evidence of small bowel wall thickening or pneumatosis. Diffuse mesenteric and body wall edema noted, but no focal inflammatory process or abscess identified.  Vascular/Lymphatic: No pathologically enlarged lymph nodes identified. No other significant abnormality identified.  Reproductive: Uterine fibroids seen, largest measuring approximately 5 cm.  Other:  None.   Musculoskeletal:  No suspicious bone lesions identified.  IMPRESSION: Distal small bowel obstruction due to periumbilical hernia containing several small  bowel loops.  Gallbladder sludge versus tiny gallstones. No radiographic evidence of acute cholecystitis.  Diffuse mesenteric and body wall edema.  Uterine fibroids.   Electronically Signed   By: Earle Gell M.D.   On: 03/20/2014 20:51   Dg Abd Acute W/chest  03/20/2014   CLINICAL DATA:  Vomiting, central abdominal pain, abdominal distension, and cough for 2 days, history hypertension  EXAM: ACUTE ABDOMEN SERIES (ABDOMEN 2 VIEW & CHEST 1 VIEW)  COMPARISON:  Chest radiograph 10/08/2013  FINDINGS: Enlargement of cardiac silhouette.  Atherosclerotic calcification aorta.  Mediastinal contours and pulmonary vascularity normal.  Lungs clear.  No pleural effusion or pneumothorax.  Multiple air-filled dilated loops of small bowel with paucity of colonic gas consistent with small bowel obstruction.  No bowel wall thickening or free intraperitoneal air.  Mild gastric distention noted as well.  Numerous pelvic phleboliths.  Degenerative disc disease changes lumbar spine.  IMPRESSION: Small bowel obstruction.  Enlargement of cardiac silhouette.   Electronically Signed   By: Lavonia Dana M.D.   On: 03/20/2014 17:44    Review of Systems  Constitutional: Positive for weight loss. Negative for fever and chills.  Respiratory: Positive for cough. Negative for sputum production and shortness of breath.   Cardiovascular: Negative for chest pain and leg swelling.  Gastrointestinal: Positive for nausea, vomiting and abdominal pain. Negative for constipation.  Musculoskeletal: Positive for joint pain.   Blood pressure 159/63, pulse 92, temperature 98.7 F (37.1 C), temperature source Oral, resp. rate 18, height 5' 3"  (1.6 m), weight 57.425 kg (126 lb 9.6 oz), SpO2 100 %. Physical Exam  Constitutional: No distress.  Generally patient looks emaciated  Eyes: No scleral  icterus.  Neck: No JVD present.  Cardiovascular: Normal rate and regular rhythm.   Respiratory: She has no wheezes. She has no rales.  Decreased breath sound bilaterally and posteriorly  GI: She exhibits distension. There is no tenderness. There is no rebound.  She has periumbilical hernia, nontender and positive bowel sound. It is protruding anteriorly  Musculoskeletal: She exhibits no edema.  Neurological: She is alert.    Assessment/Plan: Problem #1 acute kidney injury: Most likely superimposed on chronic. Her BUN and creatinine is increasing. This could be secondary to pre-renal syndrome as patient has decreased by mouth intake. However other etiologies such as ATN cannot be ruled out. Patient's CT scan didn't show any hydronephrosis. Problem #2 possible chronic renal failure: Patient had creatinine of 1.39 on 9/23 2015 about 5 months ago. Her EGFR during that time was 40 mL/m hence stage III. At this moment no other blood work to compare her renal function. Problem #3 partial small bowel obstruction possibly from her hernia. At this moment seems to be reducible but still she has some distention. Problem #4 anemia: Previous study showed her iron saturation to be 6 and creatinine of 12 hence iron deficiency anemia. Presently her hemoglobin is low. Problem# 5 history of hypertension Problem #6 hypercholesterolemia Plan: We'll increase her IV fluid at 125 mL per hour We'll do iron studies We'll follow her blood work. Once patient is adequately hydrated we'll consider initiating diuretics. We'll check UA. We'll check ANA, hepatitis B surface antigen, hepatitis C antibody, complement. We'll do 24-hour urine for protein and immunoelectrophoresis.  Derrek Puff S 03/21/2014, 10:35 AM

## 2014-03-21 NOTE — Consult Note (Signed)
Reason for Consult: Incarcerated ventral hernia Referring Physician: Dr. Lorriane Shire  JASNEET SCHOBERT is an 81 y.o. female.  HPI: Patient is an 81 year old black female who presented emergency room with worsening nausea and abdominal pain. CT scan the abdomen revealed a partial small bowel obstruction secondary to a ventral hernia. In the emergency room, this was reducible. She was also noted to have renal insufficiency and chronic anemia. This morning, she states she still has mild abdominal pain. No vomiting has been noted. Patient is somewhat demented and not a good historian.  Past Medical History  Diagnosis Date  . Hypertension     History reviewed. No pertinent past surgical history.  History reviewed. No pertinent family history.  Social History:  reports that she has never smoked. She does not have any smokeless tobacco history on file. She reports that she does not drink alcohol or use illicit drugs.  Allergies: No Known Allergies  Medications: I have reviewed the patient's current medications.  Results for orders placed or performed during the hospital encounter of 03/20/14 (from the past 48 hour(s))  Comprehensive metabolic panel     Status: Abnormal   Collection Time: 03/20/14  4:02 PM  Result Value Ref Range   Sodium 140 135 - 145 mmol/L   Potassium 4.9 3.5 - 5.1 mmol/L   Chloride 103 96 - 112 mmol/L   CO2 28 19 - 32 mmol/L   Glucose, Bld 122 (H) 70 - 99 mg/dL   BUN 66 (H) 6 - 23 mg/dL   Creatinine, Ser 2.70 (H) 0.50 - 1.10 mg/dL   Calcium 10.0 8.4 - 10.5 mg/dL   Total Protein 6.8 6.0 - 8.3 g/dL   Albumin 3.3 (L) 3.5 - 5.2 g/dL   AST 30 0 - 37 U/L   ALT 18 0 - 35 U/L   Alkaline Phosphatase 60 39 - 117 U/L   Total Bilirubin 0.7 0.3 - 1.2 mg/dL   GFR calc non Af Amer 15 (L) >90 mL/min   GFR calc Af Amer 18 (L) >90 mL/min    Comment: (NOTE) The eGFR has been calculated using the CKD EPI equation. This calculation has not been validated in all clinical  situations. eGFR's persistently <90 mL/min signify possible Chronic Kidney Disease.    Anion gap 9 5 - 15  CBC with Differential     Status: Abnormal   Collection Time: 03/20/14  4:02 PM  Result Value Ref Range   WBC 7.9 4.0 - 10.5 K/uL   RBC 2.55 (L) 3.87 - 5.11 MIL/uL   Hemoglobin 7.7 (L) 12.0 - 15.0 g/dL   HCT 24.2 (L) 36.0 - 46.0 %   MCV 94.9 78.0 - 100.0 fL   MCH 30.2 26.0 - 34.0 pg   MCHC 31.8 30.0 - 36.0 g/dL   RDW 15.5 11.5 - 15.5 %   Platelets 284 150 - 400 K/uL   Neutrophils Relative % 73 43 - 77 %   Neutro Abs 5.7 1.7 - 7.7 K/uL   Lymphocytes Relative 17 12 - 46 %   Lymphs Abs 1.4 0.7 - 4.0 K/uL   Monocytes Relative 8 3 - 12 %   Monocytes Absolute 0.6 0.1 - 1.0 K/uL   Eosinophils Relative 2 0 - 5 %   Eosinophils Absolute 0.2 0.0 - 0.7 K/uL   Basophils Relative 0 0 - 1 %   Basophils Absolute 0.0 0.0 - 0.1 K/uL  Lipase, blood     Status: None   Collection Time: 03/20/14  4:02 PM  Result Value Ref Range   Lipase 24 11 - 59 U/L  Reticulocytes     Status: Abnormal   Collection Time: 03/20/14  4:02 PM  Result Value Ref Range   Retic Ct Pct 2.8 0.4 - 3.1 %   RBC. 2.56 (L) 3.87 - 5.11 MIL/uL   Retic Count, Manual 71.7 19.0 - 186.0 K/uL  Type and screen     Status: None   Collection Time: 03/21/14  1:27 AM  Result Value Ref Range   ABO/RH(D) O POS    Antibody Screen NEG    Sample Expiration 26/94/8546   Basic metabolic panel     Status: Abnormal   Collection Time: 03/21/14  6:11 AM  Result Value Ref Range   Sodium 139 135 - 145 mmol/L   Potassium 4.4 3.5 - 5.1 mmol/L   Chloride 104 96 - 112 mmol/L   CO2 27 19 - 32 mmol/L   Glucose, Bld 110 (H) 70 - 99 mg/dL   BUN 78 (H) 6 - 23 mg/dL   Creatinine, Ser 2.95 (H) 0.50 - 1.10 mg/dL   Calcium 9.2 8.4 - 10.5 mg/dL   GFR calc non Af Amer 14 (L) >90 mL/min   GFR calc Af Amer 16 (L) >90 mL/min    Comment: (NOTE) The eGFR has been calculated using the CKD EPI equation. This calculation has not been validated in all  clinical situations. eGFR's persistently <90 mL/min signify possible Chronic Kidney Disease.    Anion gap 8 5 - 15  CBC     Status: Abnormal   Collection Time: 03/21/14  6:11 AM  Result Value Ref Range   WBC 4.9 4.0 - 10.5 K/uL   RBC 2.46 (L) 3.87 - 5.11 MIL/uL   Hemoglobin 7.4 (L) 12.0 - 15.0 g/dL   HCT 23.4 (L) 36.0 - 46.0 %   MCV 95.1 78.0 - 100.0 fL   MCH 30.1 26.0 - 34.0 pg   MCHC 31.6 30.0 - 36.0 g/dL   RDW 15.4 11.5 - 15.5 %   Platelets 270 150 - 400 K/uL  Reticulocytes     Status: Abnormal   Collection Time: 03/21/14 10:04 AM  Result Value Ref Range   Retic Ct Pct 2.8 0.4 - 3.1 %   RBC. 2.25 (L) 3.87 - 5.11 MIL/uL   Retic Count, Manual 63.0 19.0 - 186.0 K/uL    Ct Abdomen Pelvis Wo Contrast  03/20/2014   CLINICAL DATA:  Small bowel obstruction. Vomiting. Abdominal pain. Large ventral hernia.  EXAM: CT ABDOMEN AND PELVIS WITHOUT CONTRAST  TECHNIQUE: Multidetector CT imaging of the abdomen and pelvis was performed following the standard protocol without IV contrast.  COMPARISON:  None.  FINDINGS: Lower chest:  Unremarkable.  Hepatobiliary: No mass visualized on this unenhanced exam. High attenuation sludge or tiny stones seen within the gallbladder. No evidence of cholecystitis.  Pancreas: No mass or inflammatory process visualized on this unenhanced exam.  Spleen:  Within normal limits in size.  Adrenal Glands:  No masses identified.  Kidneys/Urinary tract: No evidence of urolithiasis or hydronephrosis.  Stomach/Bowel/Peritoneum: Multiple fluid-filled dilated small bowel loops are seen within the abdomen and pelvis. A transition point is seen at a moderate size periumbilical hernia containing multiple small bowel loops. No evidence of small bowel wall thickening or pneumatosis. Diffuse mesenteric and body wall edema noted, but no focal inflammatory process or abscess identified.  Vascular/Lymphatic: No pathologically enlarged lymph nodes identified. No other significant abnormality  identified.  Reproductive: Uterine  fibroids seen, largest measuring approximately 5 cm.  Other:  None.  Musculoskeletal:  No suspicious bone lesions identified.  IMPRESSION: Distal small bowel obstruction due to periumbilical hernia containing several small bowel loops.  Gallbladder sludge versus tiny gallstones. No radiographic evidence of acute cholecystitis.  Diffuse mesenteric and body wall edema.  Uterine fibroids.   Electronically Signed   By: Earle Gell M.D.   On: 03/20/2014 20:51   Dg Abd Acute W/chest  03/20/2014   CLINICAL DATA:  Vomiting, central abdominal pain, abdominal distension, and cough for 2 days, history hypertension  EXAM: ACUTE ABDOMEN SERIES (ABDOMEN 2 VIEW & CHEST 1 VIEW)  COMPARISON:  Chest radiograph 10/08/2013  FINDINGS: Enlargement of cardiac silhouette.  Atherosclerotic calcification aorta.  Mediastinal contours and pulmonary vascularity normal.  Lungs clear.  No pleural effusion or pneumothorax.  Multiple air-filled dilated loops of small bowel with paucity of colonic gas consistent with small bowel obstruction.  No bowel wall thickening or free intraperitoneal air.  Mild gastric distention noted as well.  Numerous pelvic phleboliths.  Degenerative disc disease changes lumbar spine.  IMPRESSION: Small bowel obstruction.  Enlargement of cardiac silhouette.   Electronically Signed   By: Lavonia Dana M.D.   On: 03/20/2014 17:44    ROS: See chart Blood pressure 159/63, pulse 92, temperature 98.7 F (37.1 C), temperature source Oral, resp. rate 18, height 5' 3"  (1.6 m), weight 57.425 kg (126 lb 9.6 oz), SpO2 100 %. Physical Exam: Pleasant black female in no acute distress. Abdomen soft with a supraumbilical ventral hernia present. I was able to easily reduce the bowel contents. The ventral hernia measures approximately 3-4 cm in its greatest diameter. The bowel does flow freely through the hernia. The abdomen is not particularly distended. No rigidity is  noted.  Assessment/Plan: Impression: Ventral hernia, reducible. Bowel obstruction secondary to hernia. Acute renal injury, chronic anemia Plan: Patient will need ventral herniorrhaphy with mesh prior to discharge given her multiple medical problems. She will need A Hematocrit of 28-30 Prior to Surgery. Would like seeing her renal function improving prior to surgery. We will follow with you.  Hassan Blackshire A 03/21/2014, 12:02 PM

## 2014-03-22 LAB — IRON AND TIBC
Iron: <10 ug/dL — ABNORMAL LOW (ref 42–145)
UIBC: 158 ug/dL (ref 125–400)

## 2014-03-22 LAB — BASIC METABOLIC PANEL
Anion gap: 11 (ref 5–15)
BUN: 83 mg/dL — ABNORMAL HIGH (ref 6–23)
CALCIUM: 8.1 mg/dL — AB (ref 8.4–10.5)
CO2: 21 mmol/L (ref 19–32)
Chloride: 109 mmol/L (ref 96–112)
Creatinine, Ser: 2.81 mg/dL — ABNORMAL HIGH (ref 0.50–1.10)
GFR calc Af Amer: 17 mL/min — ABNORMAL LOW (ref >90)
GFR calc non Af Amer: 15 mL/min — ABNORMAL LOW (ref >90)
Glucose, Bld: 81 mg/dL (ref 70–99)
Potassium: 4.1 mmol/L (ref 3.5–5.1)
SODIUM: 141 mmol/L (ref 135–145)

## 2014-03-22 LAB — C4 COMPLEMENT: Complement C4, Body Fluid: 26 mg/dL (ref 14–44)

## 2014-03-22 LAB — VITAMIN B12
Vitamin B-12: 1310 pg/mL — ABNORMAL HIGH (ref 211–911)
Vitamin B-12: 1190 pg/mL — ABNORMAL HIGH (ref 211–911)

## 2014-03-22 LAB — HEPATITIS B SURFACE ANTIGEN: Hepatitis B Surface Ag: NEGATIVE

## 2014-03-22 LAB — FOLATE

## 2014-03-22 LAB — FERRITIN
FERRITIN: 67 ng/mL (ref 10–291)
FERRITIN: 85 ng/mL (ref 10–291)
Ferritin: 60 ng/mL (ref 10–291)

## 2014-03-22 LAB — HEMOGLOBIN AND HEMATOCRIT, BLOOD
HCT: 21.9 % — ABNORMAL LOW (ref 36.0–46.0)
Hemoglobin: 7 g/dL — ABNORMAL LOW (ref 12.0–15.0)

## 2014-03-22 LAB — PHOSPHORUS: Phosphorus: 4.7 mg/dL — ABNORMAL HIGH (ref 2.3–4.6)

## 2014-03-22 LAB — C3 COMPLEMENT: C3 COMPLEMENT: 108 mg/dL (ref 82–167)

## 2014-03-22 LAB — HEPATITIS C ANTIBODY: HCV Ab: NEGATIVE

## 2014-03-22 LAB — PREPARE RBC (CROSSMATCH)

## 2014-03-22 MED ORDER — SODIUM CHLORIDE 0.9 % IV SOLN
Freq: Once | INTRAVENOUS | Status: AC
  Administered 2014-03-22: 10 mL via INTRAVENOUS

## 2014-03-22 MED ORDER — SODIUM CHLORIDE 0.9 % IV SOLN
Freq: Once | INTRAVENOUS | Status: AC
  Administered 2014-03-22: 16:00:00 via INTRAVENOUS

## 2014-03-22 MED ORDER — SODIUM CHLORIDE 0.9 % IV SOLN
510.0000 mg | INTRAVENOUS | Status: DC
  Administered 2014-03-22: 510 mg via INTRAVENOUS
  Filled 2014-03-22 (×2): qty 17

## 2014-03-22 NOTE — Progress Notes (Signed)
Subjective: No abdominal pain. Is having diarrhea.  Objective: Vital signs in last 24 hours: Temp:  [98.2 F (36.8 C)-99 F (37.2 C)] 98.7 F (37.1 C) (03/06 0641) Pulse Rate:  [79-87] 79 (03/06 0641) Resp:  [16-18] 18 (03/06 0641) BP: (106-129)/(38-47) 129/47 mmHg (03/06 0641) SpO2:  [99 %-100 %] 100 % (03/06 0641) Weight:  [59.8 kg (131 lb 13.4 oz)] 59.8 kg (131 lb 13.4 oz) (03/06 0641) Last BM Date: 03/21/14  Intake/Output from previous day: 03/05 0701 - 03/06 0700 In: 3332.1 [P.O.:240; I.V.:3092.1] Out: 75 [Urine:75] Intake/Output this shift: Total I/O In: -  Out: 100 [Urine:100]  General appearance: alert, cooperative and no distress GI: Soft. Nontender, nondistended. Ventral hernia easily reducible.  Lab Results:   Recent Labs  03/20/14 1602 03/21/14 0611 03/22/14 0558  WBC 7.9 4.9  --   HGB 7.7* 7.4* 7.0*  HCT 24.2* 23.4* 21.9*  PLT 284 270  --    BMET  Recent Labs  03/21/14 0611 03/22/14 0558  NA 139 141  K 4.4 4.1  CL 104 109  CO2 27 21  GLUCOSE 110* 81  BUN 78* 83*  CREATININE 2.95* 2.81*  CALCIUM 9.2 8.1*   PT/INR No results for input(s): LABPROT, INR in the last 72 hours.  Studies/Results: Ct Abdomen Pelvis Wo Contrast  03/20/2014   CLINICAL DATA:  Small bowel obstruction. Vomiting. Abdominal pain. Large ventral hernia.  EXAM: CT ABDOMEN AND PELVIS WITHOUT CONTRAST  TECHNIQUE: Multidetector CT imaging of the abdomen and pelvis was performed following the standard protocol without IV contrast.  COMPARISON:  None.  FINDINGS: Lower chest:  Unremarkable.  Hepatobiliary: No mass visualized on this unenhanced exam. High attenuation sludge or tiny stones seen within the gallbladder. No evidence of cholecystitis.  Pancreas: No mass or inflammatory process visualized on this unenhanced exam.  Spleen:  Within normal limits in size.  Adrenal Glands:  No masses identified.  Kidneys/Urinary tract: No evidence of urolithiasis or hydronephrosis.   Stomach/Bowel/Peritoneum: Multiple fluid-filled dilated small bowel loops are seen within the abdomen and pelvis. A transition point is seen at a moderate size periumbilical hernia containing multiple small bowel loops. No evidence of small bowel wall thickening or pneumatosis. Diffuse mesenteric and body wall edema noted, but no focal inflammatory process or abscess identified.  Vascular/Lymphatic: No pathologically enlarged lymph nodes identified. No other significant abnormality identified.  Reproductive: Uterine fibroids seen, largest measuring approximately 5 cm.  Other:  None.  Musculoskeletal:  No suspicious bone lesions identified.  IMPRESSION: Distal small bowel obstruction due to periumbilical hernia containing several small bowel loops.  Gallbladder sludge versus tiny gallstones. No radiographic evidence of acute cholecystitis.  Diffuse mesenteric and body wall edema.  Uterine fibroids.   Electronically Signed   By: [name]  Stahl M.D.   On: 03/20/2014 20:51   Dg Abd Acute W/chest  03/20/2014   CLINICAL DATA:  Vomiting, central abdominal pain, abdominal distension, and cough for 2 days, history hypertension  EXAM: ACUTE ABDOMEN SERIES (ABDOMEN 2 VIEW & CHEST 1 VIEW)  COMPARISON:  Chest radiograph 10/08/2013  FINDINGS: Enlargement of cardiac silhouette.  Atherosclerotic calcification aorta.  Mediastinal contours and pulmonary vascularity normal.  Lungs clear.  No pleural effusion or pneumothorax.  Multiple air-filled dilated loops of small bowel with paucity of colonic gas consistent with small bowel obstruction.  No bowel wall thickening or free intraperitoneal air.  Mild gastric distention noted as well.  Numerous pelvic phleboliths.  Degenerative disc disease changes lumbar spine.  IMPRESSION: Small bowel obstruction.  Enlargement of cardiac silhouette.   Electronically Signed   By: [name]   On: 03/20/2014 17:44    Anti-infectives: Anti-infectives    None       Assessment/Plan: Impression: Ventral hernia, renal insufficiency, anemia. Plan: I would like to fix her hernia prior to discharge. Agree with transfusion. Once her renal function has significant improved, I will take her to the operating room. She can be on clear liquid diet as needed.  LOS: 2 days    Gursimran Litaker A 03/22/2014

## 2014-03-22 NOTE — Progress Notes (Signed)
24 hour urine unable to be completed at this timedue to urine contamination with stool each time patient voids.

## 2014-03-22 NOTE — Progress Notes (Signed)
Patient complains of some moderate periumbilical discomfort. Alert and oriented with globin at 7 iron less than 10 on intravenous iron currently we'll transfuse 1 unit currently and then another later today for preoperative optimization Joyce Carpenter BSW:[id number] DOB: [birth year] DOA: 03/20/2014 PCP: Maricela Curet, MD             Physical Exam: Blood pressure 129/47, pulse 79, temperature 98.7 F (37.1 C), temperature source Oral, resp. rate 18, height 5' 3"  (1.6 m), weight 131 lb 13.4 oz (59.8 kg), SpO2 100 %. lungs clear to A&P no rales wheeze rhonchi heart regular no S3-S4 or rubs abdomen protuberant umbilicus ventral hernia or evidence of incarceration I did not attempt to reduce Will defer to surgery on this she is nothing by mouth we'll check be met hemoglobin in a.m.   Investigations:  No results found for this or any previous visit (from the past 240 hour(s)).   Basic Metabolic Panel:  Recent Labs  03/21/14 0611 03/22/14 0558  NA 139 141  K 4.4 4.1  CL 104 109  CO2 27 21  GLUCOSE 110* 81  BUN 78* 83*  CREATININE 2.95* 2.81*  CALCIUM 9.2 8.1*  PHOS  --  4.7*   Liver Function Tests:  Recent Labs  03/20/14 1602  AST 30  ALT 18  ALKPHOS 60  BILITOT 0.7  PROT 6.8  ALBUMIN 3.3*     CBC:  Recent Labs  03/20/14 1602 03/21/14 0611 03/22/14 0558  WBC 7.9 4.9  --   NEUTROABS 5.7  --   --   HGB 7.7* 7.4* 7.0*  HCT 24.2* 23.4* 21.9*  MCV 94.9 95.1  --   PLT 284 270  --     Ct Abdomen Pelvis Wo Contrast  03/20/2014   CLINICAL DATA:  Small bowel obstruction. Vomiting. Abdominal pain. Large ventral hernia.  EXAM: CT ABDOMEN AND PELVIS WITHOUT CONTRAST  TECHNIQUE: Multidetector CT imaging of the abdomen and pelvis was performed following the standard protocol without IV contrast.  COMPARISON:  None.  FINDINGS: Lower chest:  Unremarkable.  Hepatobiliary: No mass visualized on this unenhanced exam. High attenuation sludge or tiny stones seen within  the gallbladder. No evidence of cholecystitis.  Pancreas: No mass or inflammatory process visualized on this unenhanced exam.  Spleen:  Within normal limits in size.  Adrenal Glands:  No masses identified.  Kidneys/Urinary tract: No evidence of urolithiasis or hydronephrosis.  Stomach/Bowel/Peritoneum: Multiple fluid-filled dilated small bowel loops are seen within the abdomen and pelvis. A transition point is seen at a moderate size periumbilical hernia containing multiple small bowel loops. No evidence of small bowel wall thickening or pneumatosis. Diffuse mesenteric and body wall edema noted, but no focal inflammatory process or abscess identified.  Vascular/Lymphatic: No pathologically enlarged lymph nodes identified. No other significant abnormality identified.  Reproductive: Uterine fibroids seen, largest measuring approximately 5 cm.  Other:  None.  Musculoskeletal:  No suspicious bone lesions identified.  IMPRESSION: Distal small bowel obstruction due to periumbilical hernia containing several small bowel loops.  Gallbladder sludge versus tiny gallstones. No radiographic evidence of acute cholecystitis.  Diffuse mesenteric and body wall edema.  Uterine fibroids.   Electronically Signed   By: Earle Gell M.D.   On: 03/20/2014 20:51   Dg Abd Acute W/chest  03/20/2014   CLINICAL DATA:  Vomiting, central abdominal pain, abdominal distension, and cough for 2 days, history hypertension  EXAM: ACUTE ABDOMEN SERIES (ABDOMEN 2 VIEW & CHEST 1 VIEW)  COMPARISON:  Chest radiograph 10/08/2013  FINDINGS: Enlargement of cardiac silhouette.  Atherosclerotic calcification aorta.  Mediastinal contours and pulmonary vascularity normal.  Lungs clear.  No pleural effusion or pneumothorax.  Multiple air-filled dilated loops of small bowel with paucity of colonic gas consistent with small bowel obstruction.  No bowel wall thickening or free intraperitoneal air.  Mild gastric distention noted as well.  Numerous pelvic  phleboliths.  Degenerative disc disease changes lumbar spine.  IMPRESSION: Small bowel obstruction.  Enlargement of cardiac silhouette.   Electronically Signed   By: Lavonia Dana M.D.   On: 03/20/2014 17:44      Medications:   Impression: Hypertension Hyperlipidemia Moderate dementia  Principal Problem:   Partial small bowel obstruction Active Problems:   Hypertension   Umbilical hernia   Nausea & vomiting   Abdominal pain, acute   Iron deficiency anemia   Acute renal failure   Essential hypertension     Plan: Patient to have 2 units of PRBCs will transfuse 1 unit now 1 later one unit after observation for preoperative optimization intravenous iron serial be met in hemoglobin and hematocrit   Consultants: Surgery and nephrology    Procedures   Antibiotics:                   Code Status: Full  Family Communication:    Disposition Plan patient will receive one then second unit of PRBCs monitor renal function and hemoglobin and hematocrit in anticipation of surgery  Time spent: 30 minutes   LOS: 2 days   [name]   03/22/2014, 11:28 AM

## 2014-03-22 NOTE — Progress Notes (Signed)
Subjective: Interval History: has no complaint of still some abdominal pain but she states that is getting better. Patient denies any difficulty in breathing. Presently seems to have also some diarrhea..  Objective: Vital signs in last 24 hours: Temp:  [98.2 F (36.8 C)-99 F (37.2 C)] 98.7 F (37.1 C) (03/06 0641) Pulse Rate:  [79-87] 79 (03/06 0641) Resp:  [16-18] 18 (03/06 0641) BP: (106-129)/(38-47) 129/47 mmHg (03/06 0641) SpO2:  [99 %-100 %] 100 % (03/06 0641) Weight:  [59.8 kg (131 lb 13.4 oz)] 59.8 kg (131 lb 13.4 oz) (03/06 0641) Weight change: 2.375 kg (5 lb 3.8 oz)  Intake/Output from previous day: 03/05 0701 - 03/06 0700 In: 3332.1 [P.O.:240; I.V.:3092.1] Out: 75 [Urine:75] Intake/Output this shift: Total I/O In: -  Out: 100 [Urine:100]  General appearance: alert, cooperative and no distress Resp: clear to auscultation bilaterally Cardio: regular rate and rhythm, S1, S2 normal, no murmur, click, rub or gallop GI: Soft midline periumbilical hernia. Patient is mild tenderness but she has bowel sound. Extremities: extremities normal, atraumatic, no cyanosis or edema  Lab Results:  Recent Labs  03/20/14 1602 03/21/14 0611 03/22/14 0558  WBC 7.9 4.9  --   HGB 7.7* 7.4* 7.0*  HCT 24.2* 23.4* 21.9*  PLT 284 270  --    BMET:  Recent Labs  03/21/14 0611 03/22/14 0558  NA 139 141  K 4.4 4.1  CL 104 109  CO2 27 21  GLUCOSE 110* 81  BUN 78* 83*  CREATININE 2.95* 2.81*  CALCIUM 9.2 8.1*   No results for input(s): PTH in the last 72 hours. Iron Studies:  Recent Labs  03/21/14 1004  IRON <10*  TIBC Not calculated due to Iron <10.  FERRITIN 60    Studies/Results: Ct Abdomen Pelvis Wo Contrast  03/20/2014   CLINICAL DATA:  Small bowel obstruction. Vomiting. Abdominal pain. Large ventral hernia.  EXAM: CT ABDOMEN AND PELVIS WITHOUT CONTRAST  TECHNIQUE: Multidetector CT imaging of the abdomen and pelvis was performed following the standard protocol without  IV contrast.  COMPARISON:  None.  FINDINGS: Lower chest:  Unremarkable.  Hepatobiliary: No mass visualized on this unenhanced exam. High attenuation sludge or tiny stones seen within the gallbladder. No evidence of cholecystitis.  Pancreas: No mass or inflammatory process visualized on this unenhanced exam.  Spleen:  Within normal limits in size.  Adrenal Glands:  No masses identified.  Kidneys/Urinary tract: No evidence of urolithiasis or hydronephrosis.  Stomach/Bowel/Peritoneum: Multiple fluid-filled dilated small bowel loops are seen within the abdomen and pelvis. A transition point is seen at a moderate size periumbilical hernia containing multiple small bowel loops. No evidence of small bowel wall thickening or pneumatosis. Diffuse mesenteric and body wall edema noted, but no focal inflammatory process or abscess identified.  Vascular/Lymphatic: No pathologically enlarged lymph nodes identified. No other significant abnormality identified.  Reproductive: Uterine fibroids seen, largest measuring approximately 5 cm.  Other:  None.  Musculoskeletal:  No suspicious bone lesions identified.  IMPRESSION: Distal small bowel obstruction due to periumbilical hernia containing several small bowel loops.  Gallbladder sludge versus tiny gallstones. No radiographic evidence of acute cholecystitis.  Diffuse mesenteric and body wall edema.  Uterine fibroids.   Electronically Signed   By: Myles Rosenthal M.D.   On: 03/20/2014 20:51   Dg Abd Acute W/chest  03/20/2014   CLINICAL DATA:  Vomiting, central abdominal pain, abdominal distension, and cough for 2 days, history hypertension  EXAM: ACUTE ABDOMEN SERIES (ABDOMEN 2 VIEW & CHEST 1 VIEW)  COMPARISON:  Chest radiograph 10/08/2013  FINDINGS: Enlargement of cardiac silhouette.  Atherosclerotic calcification aorta.  Mediastinal contours and pulmonary vascularity normal.  Lungs clear.  No pleural effusion or pneumothorax.  Multiple air-filled dilated loops of small bowel with  paucity of colonic gas consistent with small bowel obstruction.  No bowel wall thickening or free intraperitoneal air.  Mild gastric distention noted as well.  Numerous pelvic phleboliths.  Degenerative disc disease changes lumbar spine.  IMPRESSION: Small bowel obstruction.  Enlargement of cardiac silhouette.   Electronically Signed   By: Ulyses SouthwardMark  Boles M.D.   On: 03/20/2014 17:44    I have reviewed the patient's current medications.  Assessment/Plan: Problem #1 acute kidney injury: Possibly ATN versus prerenal. Presently her renal function seems to be improving progressively. Difficult to document her urine output as patient unable to collect her urine. According to the nurses her urine output is makes it with bowel movement answer difficult to determine. Patient presently does not have any sign of fluid overload. Problem #2 hypertension: Her blood pressure is reasonably controlled Problem #3 small bowel obstruction Problem #4 anemia: At this moment seems to be iron deficiency anemia as her ferritin is low but iron saturation is adequate because of severe low iron. Problem #5 chronic renal failure: Possibly stage III Problem #5 metabolic bone disease her calcium is in range Plan: We'll give Feraheme 510 mg IV 1 dose We'll continue his present management We'll check her basic metabolic panel and vitamin D level in the morning.    LOS: 2 days   Charnee Turnipseed S 03/22/2014,8:57 AM

## 2014-03-23 LAB — BASIC METABOLIC PANEL
ANION GAP: 8 (ref 5–15)
BUN: 70 mg/dL — AB (ref 6–23)
CALCIUM: 8 mg/dL — AB (ref 8.4–10.5)
CO2: 23 mmol/L (ref 19–32)
Chloride: 112 mmol/L (ref 96–112)
Creatinine, Ser: 1.96 mg/dL — ABNORMAL HIGH (ref 0.50–1.10)
GFR calc non Af Amer: 23 mL/min — ABNORMAL LOW (ref >90)
GFR, EST AFRICAN AMERICAN: 26 mL/min — AB (ref >90)
Glucose, Bld: 121 mg/dL — ABNORMAL HIGH (ref 70–99)
Potassium: 3.4 mmol/L — ABNORMAL LOW (ref 3.5–5.1)
Sodium: 143 mmol/L (ref 135–145)

## 2014-03-23 LAB — OCCULT BLOOD X 1 CARD TO LAB, STOOL: FECAL OCCULT BLD: POSITIVE — AB

## 2014-03-23 LAB — CLOSTRIDIUM DIFFICILE BY PCR: Toxigenic C. Difficile by PCR: NEGATIVE

## 2014-03-23 LAB — HEMOGLOBIN AND HEMATOCRIT, BLOOD
HCT: 32.5 % — ABNORMAL LOW (ref 36.0–46.0)
Hemoglobin: 10.7 g/dL — ABNORMAL LOW (ref 12.0–15.0)

## 2014-03-23 MED ORDER — POTASSIUM CHLORIDE IN NACL 20-0.45 MEQ/L-% IV SOLN
INTRAVENOUS | Status: DC
  Administered 2014-03-23 – 2014-03-25 (×5): via INTRAVENOUS
  Filled 2014-03-23 (×8): qty 1000

## 2014-03-23 NOTE — Care Management Note (Addendum)
    Page 1 of 2   03/27/2014     1:22:34 PM CARE MANAGEMENT NOTE 03/27/2014  Patient:  Garner NashSUMMERS,Laquiesha E   Account Number:  [id number]  Date Initiated:  03/23/2014  Documentation initiated by:  CHILDRESS,JESSICA  Subjective/Objective Assessment:   Pt admitted with N/V/abd pain. Pt is from home, lives with sister. Pt states she is indpendent at home with use of a walker. Pt has no HH services. Pt plans to discharge home. PT eval needed. Will cont to follow.     Action/Plan:   Anticipated DC Date:  03/25/2014   Anticipated DC Plan:  HOME/SELF CARE      DC Planning Services  CM consult      [facility]   Choice offered to / List presented to:  C-1 Patient        HH arranged  HH-1 RN  HH-2 PT  HH-4 NURSE'S AIDE      HH agency  [facility]   Status of service:  Completed, signed off Medicare Important Message given?  YES (If response is "NO", the following Medicare IM given date fields will be blank) Date Medicare IM given:  03/27/2014 Medicare IM given by:  Kathyrn SheriffHILDRESS,JESSICA Date Additional Medicare IM given:   Additional Medicare IM given by:    Discharge Disposition:  HOME W HOME HEALTH SERVICES  Per UR Regulation:  Reviewed for med. necessity/level of care/duration of stay  If discussed at Long Length of Stay Meetings, dates discussed:   03/26/2014    Comments:  03/27/2014 1320 Kathyrn SheriffJessica Childress, RN, MSN, CM 03/26/2014 1300 Kathyrn SheriffJessica Childress, RN, MSN, CM [facility]. [facility]. Alroy BailiffLinda Lothian, of [facility] notified of [facility] and will obtain pt information from chart. [facility] will have 48 hours to make the first visit. No futher CM needs. Identified at this time.  03/23/2014 0930 Kathyrn SheriffJessica Childress, RN, MSN, CM

## 2014-03-23 NOTE — Progress Notes (Signed)
Vomited small amount green liquid.  Zofran IV given per orders.  Will continue to monitor.

## 2014-03-23 NOTE — Progress Notes (Signed)
Patient has less periumbilical discomfort bowel movements are normal. No nausea vomiting scheduled for surgery on 39 transfused 2 units packed cells creatinine improving Joyce Carpenter BDZ:[id number] DOB: [birth year] DOA: 03/20/2014 PCP: Joyce Curet, MD             Physical Exam: Blood pressure 158/58, pulse 75, temperature 98 F (36.7 C), temperature source Oral, resp. rate 18, height 5' 3"  (1.6 m), weight 130 lb 8 oz (59.194 kg), SpO2 99 %. lungs clear to A&P no rales wheeze rhonchi heart regular rhythm no S3 or S4 no heaves rubs abdomen protuberant bowel sounds normoactive no peristaltic rushes no guarding or rebound   Investigations:  No results found for this or any previous visit (from the past 240 hour(s)).   Basic Metabolic Panel:  Recent Labs  03/22/14 0558 03/23/14 0912  NA 141 143  K 4.1 3.4*  CL 109 112  CO2 21 23  GLUCOSE 81 121*  BUN 83* 70*  CREATININE 2.81* 1.96*  CALCIUM 8.1* 8.0*  PHOS 4.7*  --    Liver Function Tests:  Recent Labs  03/20/14 1602  AST 30  ALT 18  ALKPHOS 60  BILITOT 0.7  PROT 6.8  ALBUMIN 3.3*     CBC:  Recent Labs  03/20/14 1602 03/21/14 0611 03/22/14 0558 03/23/14 0912  WBC 7.9 4.9  --   --   NEUTROABS 5.7  --   --   --   HGB 7.7* 7.4* 7.0* 10.7*  HCT 24.2* 23.4* 21.9* 32.5*  MCV 94.9 95.1  --   --   PLT 284 270  --   --     No results found.    Medications:   Impression: Hypokalemia  Principal Problem:   Partial small bowel obstruction Active Problems:   Hypertension   Umbilical hernia   Nausea & vomiting   Abdominal pain, acute   Iron deficiency anemia   Acute renal failure   Essential hypertension     Plan: For ventral herniorrhaphy 3 / 9 KCl and IV currently check be met in a.m.   Consultants: Surgery and nephrology   Procedures   Antibiotics:                   Code Status:  Family Communication:  Spoke with sister about planned surgery  Disposition Plan KCl IV  chickpea met in a.m. monitor renal function and hematocrit for surgery on 39  Time spent: 30 minutes   LOS: 3 days   Joyce Carpenter M   03/23/2014, 12:49 PM

## 2014-03-23 NOTE — Progress Notes (Signed)
BUN and creatinine starting to normalize. Will temporarily schedule patient for a ventral herniorrhaphy with mesh on 03/25/2014.

## 2014-03-23 NOTE — Progress Notes (Signed)
Patient rested after Zofran and reported feeling better when she awoke.  [facility] and passed flatus.

## 2014-03-23 NOTE — Care Management Utilization Note (Signed)
UR completed 

## 2014-03-23 NOTE — Progress Notes (Addendum)
Subjective: Interval History: Patient presently complains of diarrhea. She doesn't have any nausea or vomiting. Her abdominal tightness is also improving. Patient does not have any difficulty breathing.  Objective: Vital signs in last 24 hours: Temp:  [97.9 F (36.6 C)-98.7 F (37.1 C)] 98 F (36.7 C) (03/07 0700) Pulse Rate:  [75-100] 75 (03/07 0700) Resp:  [15-18] 18 (03/07 0700) BP: (100-174)/(48-74) 158/58 mmHg (03/07 0700) SpO2:  [99 %-100 %] 99 % (03/07 0700) Weight:  [59.194 kg (130 lb 8 oz)] 59.194 kg (130 lb 8 oz) (03/07 0643) Weight change: -0.606 kg (-1 lb 5.4 oz)  Intake/Output from previous day: 03/06 0701 - 03/07 0700 In: 2512 [P.O.:1092; I.V.:750; Blood:670] Out: 700 [Urine:700] Intake/Output this shift:    General appearance: alert, cooperative and no distress Resp: clear to auscultation bilaterally Cardio: regular rate and rhythm, S1, S2 normal, no murmur, click, rub or gallop GI: Soft midline periumbilical hernia. Patient is mild tenderness but she has bowel sound. Extremities: extremities normal, atraumatic, no cyanosis or edema  Lab Results:  Recent Labs  03/20/14 1602 03/21/14 0611 03/22/14 0558 03/23/14 0912  WBC 7.9 4.9  --   --   HGB 7.7* 7.4* 7.0* 10.7*  HCT 24.2* 23.4* 21.9* 32.5*  PLT 284 270  --   --    BMET:   Recent Labs  03/22/14 0558 03/23/14 0912  NA 141 143  K 4.1 3.4*  CL 109 112  CO2 21 23  GLUCOSE 81 121*  BUN 83* 70*  CREATININE 2.81* 1.96*  CALCIUM 8.1* 8.0*   No results for input(s): PTH in the last 72 hours. Iron Studies:   Recent Labs  03/22/14 0558  IRON <10*  TIBC Not calculated due to Iron <10.  FERRITIN 85    Studies/Results: No results found.  I have reviewed the patient's current medications.  Assessment/Plan: Problem #1 acute kidney injury: Possibly ATN versus prerenal. Presently her renal function continued to improve. Patient presently is a symptomatic. Problem #2 hypertension: Her blood  pressure is reasonably controlled Problem #3 small bowel obstruction: Seems to be improving. Problem #4 anemia: At this moment seems to be iron deficiency anemia . Patient has received IV iron and also blood transfusion. Her hemoglobin has improved. Problem #5 chronic renal failure: Possibly stage III Problem #6 metabolic bone disease her calcium and her phosphorus is in range Problem #7 diarrhea: Patient complains of watery diarrhea. She doesn't have any fever or chills or sweating. Problem #8 hypokalemia possibly from GI and renal. Plan: We'll change her IV fluid to half normal saline with 20 mEq of KCl at 1 25 mL per hour We'll check stool for C. difficile. We'll check her basic metabolic panel in the morning.    LOS: 3 days   Constant Mandeville S 03/23/2014,11:11 AM

## 2014-03-23 NOTE — Progress Notes (Signed)
Passed incontinent stool in bed, soaked into linens, no specimen available for guaic.

## 2014-03-24 LAB — OCCULT BLOOD X 1 CARD TO LAB, STOOL: Fecal Occult Bld: POSITIVE — AB

## 2014-03-24 LAB — SURGICAL PCR SCREEN
MRSA, PCR: NEGATIVE
Staphylococcus aureus: POSITIVE — AB

## 2014-03-24 LAB — VITAMIN D 25 HYDROXY (VIT D DEFICIENCY, FRACTURES): Vit D, 25-Hydroxy: 42.2 ng/mL (ref 30.0–100.0)

## 2014-03-24 LAB — BASIC METABOLIC PANEL
Anion gap: 6 (ref 5–15)
BUN: 55 mg/dL — AB (ref 6–23)
CO2: 24 mmol/L (ref 19–32)
Calcium: 7.7 mg/dL — ABNORMAL LOW (ref 8.4–10.5)
Chloride: 111 mmol/L (ref 96–112)
Creatinine, Ser: 1.47 mg/dL — ABNORMAL HIGH (ref 0.50–1.10)
GFR calc Af Amer: 37 mL/min — ABNORMAL LOW (ref >90)
GFR calc non Af Amer: 32 mL/min — ABNORMAL LOW (ref >90)
GLUCOSE: 113 mg/dL — AB (ref 70–99)
POTASSIUM: 3.8 mmol/L (ref 3.5–5.1)
Sodium: 141 mmol/L (ref 135–145)

## 2014-03-24 LAB — TYPE AND SCREEN
ABO/RH(D): O POS
Antibody Screen: NEGATIVE
Unit division: 0
Unit division: 0

## 2014-03-24 LAB — COMPLEMENT, TOTAL: Compl, Total (CH50): >60 U/mL — ABNORMAL HIGH (ref 42–60)

## 2014-03-24 LAB — HEMOGLOBIN AND HEMATOCRIT, BLOOD
HCT: 32.2 % — ABNORMAL LOW (ref 36.0–46.0)
Hemoglobin: 10.2 g/dL — ABNORMAL LOW (ref 12.0–15.0)

## 2014-03-24 MED ORDER — CEFAZOLIN SODIUM-DEXTROSE 2-3 GM-% IV SOLR
2.0000 g | INTRAVENOUS | Status: AC
  Administered 2014-03-25: 2 g via INTRAVENOUS
  Filled 2014-03-24: qty 50

## 2014-03-24 MED ORDER — CEFAZOLIN SODIUM-DEXTROSE 2-3 GM-% IV SOLR: 2.0000 g | INTRAVENOUS | Status: DC

## 2014-03-24 MED ORDER — CHLORHEXIDINE GLUCONATE 4 % EX LIQD
1.0000 | Freq: Once | CUTANEOUS | Status: AC
Start: 2014-03-24 — End: 2014-03-24
  Administered 2014-03-24: 1 via TOPICAL
  Filled 2014-03-24: qty 15

## 2014-03-24 MED ORDER — POTASSIUM CHLORIDE 10 MEQ/100ML IV SOLN: 10.0000 meq | INTRAVENOUS | Status: DC

## 2014-03-24 NOTE — Care Management Utilization Note (Signed)
UR completed 

## 2014-03-24 NOTE — Progress Notes (Signed)
Patient has heme positive stools no overt bleeding S discomfort of abdomen potassium 3.4 today replenishment intravenously Joyce Carpenter DOB: [birth year] DOA: 03/20/2014 PCP: Joyce Carpenter             Physical Exam: Blood pressure 159/69, pulse 77, temperature 98.6 F (37 C), temperature source Oral, resp. rate 18, height 5\' 3"  (1.6 m), weight 133 lb (60.328 kg), SpO2 100 %. lungs clear to A&P no rales wheeze rhonchi heart regular rhythm no S3-S4 no heaves rubs abdomen protuberant ventral hernia bowel sounds normoactive bowel peristaltic rushes no guarding no rebound elicited   Investigations:  Recent Results (from the past 240 hour(s))  Clostridium Difficile by PCR     Status: None   Collection Time: 03/23/14  2:10 PM  Result Value Ref Range Status   C difficile by pcr NEGATIVE NEGATIVE Final     Basic Metabolic Panel:  Recent Labs  81/19/1401/07/01 0558 03/23/14 0912  NA 141 143  K 4.1 3.4*  CL 109 112  CO2 21 23  GLUCOSE 81 121*  BUN 83* 70*  CREATININE 2.81* 1.96*  CALCIUM 8.1* 8.0*  PHOS 4.7*  --    Liver Function Tests: No results for input(s): AST, ALT, ALKPHOS, BILITOT, PROT, ALBUMIN in the last 72 hours.   CBC:  Recent Labs  03/22/14 0558 03/23/14 0912  HGB 7.0* 10.7*  HCT 21.9* 32.5*    No results found.    Medications:   Impression: Heme-positive stools  Principal Problem:   Partial small bowel obstruction Active Problems:   Hypertension   Umbilical hernia   Nausea & vomiting   Abdominal pain, acute   Iron deficiency anemia   Acute renal failure   Essential hypertension     Plan: KCl IV 10 mEq 3 for surgery for ventral hernia repair tomorrow we'll assess heme positive stools presumably postoperatively if continuing  Consultants: Surgery    Procedures   Antibiotics:                   Code Status: Full  Family Communication:  Spoke with sister about need for surgery tomorrow in  a.m.  Disposition Plan KCl 10 mEq IV 3  Time spent: 30 minutes   LOS: 4 days   Joyce Carpenter M   03/24/2014, 6:53 AM

## 2014-03-24 NOTE — Progress Notes (Signed)
Subjective: Interval History: Patient states that she has episode of vomiting otherwise feels okay. Her abdominal pain is improving. She denies any difficulty in breathing.  Objective: Vital signs in last 24 hours: Temp:  [98.2 F (36.8 C)-98.6 F (37 C)] 98.6 F (37 C) (03/08 0538) Pulse Rate:  [72-77] 77 (03/08 0538) Resp:  [18] 18 (03/08 0538) BP: (159-171)/(59-69) 159/69 mmHg (03/08 0538) SpO2:  [100 %] 100 % (03/08 0538) Weight:  [60.328 kg (133 lb)] 60.328 kg (133 lb) (03/08 0500) Weight change: 1.134 kg (2 lb 8 oz)  Intake/Output from previous day: 03/07 0701 - 03/08 0700 In: 462.5 [I.V.:462.5] Out: 850 [Urine:850] Intake/Output this shift:    Generally she is alert and in no apparent  distress. Chest is clear to auscultation, no rales, rhonchi or wheezing. Heart exam reveals regular rate and rhythm no murmur Abdomen: She has protruding Periumbilical hernia. Presently nontender. Positive bowel sound Extremities no edema  Lab Results:  Recent Labs  03/23/14 0912 03/24/14 0613  HGB 10.7* 10.2*  HCT 32.5* 32.2*   BMET:   Recent Labs  03/23/14 0912 03/24/14 0613  NA 143 141  K 3.4* 3.8  CL 112 111  CO2 23 24  GLUCOSE 121* 113*  BUN 70* 55*  CREATININE 1.96* 1.47*  CALCIUM 8.0* 7.7*   No results for input(s): PTH in the last 72 hours. Iron Studies:   Recent Labs  03/22/14 0558  IRON <10*  TIBC Not calculated due to Iron <10.  FERRITIN 85    Studies/Results: No results found.  I have reviewed the patient's current medications.  Assessment/Plan: Problem #1 acute kidney injury: Possibly ATN versus prerenal. Presently her renal function continued to improve. At this moment seems to be returning to her baseline. Patient remained nonoliguric. Problem #2 hypertension: Her blood pressure is reasonably controlled Problem #3 small bowel obstruction: She has episode of vomiting. Her abdominal pain is better. Problem #4 anemia: At this moment seems to be  iron deficiency anemia . Patient has received IV iron and also blood transfusion. Her hemoglobin has improved. Problem #5 chronic renal failure: Possibly stage III Problem #6 metabolic bone disease her calcium and her phosphorus is in range Problem #7 diarrhea: Patient complains of watery diarrhea. Getting better. She is C. difficile negative. Problem #8 hypokalemia: Patient is on potassium supplement and her potassium is normal. Plan: We'll continue with hydration but will cut down her IV fluids to 100 mL per hour. We'll check her basic metabolic panel in the morning.    LOS: 4 days   Katrinna Travieso S 03/24/2014,8:21 AM

## 2014-03-24 NOTE — Progress Notes (Signed)
We'll proceed with ventral herniorrhaphy with mesh tomorrow. The risks and benefits of the procedure including bleeding, infection, and recurrence of the hernia were fully explained to the patient, who gave informed consent.

## 2014-03-25 ENCOUNTER — Inpatient Hospital Stay (HOSPITAL_COMMUNITY): Payer: Medicare Other | Admitting: Anesthesiology

## 2014-03-25 ENCOUNTER — Encounter (HOSPITAL_COMMUNITY)
Admission: EM | Disposition: A | Discharge status: Home Health | Payer: Self-pay | Source: Home / Self Care | Attending: Family Medicine

## 2014-03-25 ENCOUNTER — Encounter (HOSPITAL_COMMUNITY): Payer: Self-pay | Admitting: *Deleted

## 2014-03-25 HISTORY — PX: INSERTION OF MESH: SHX5868

## 2014-03-25 HISTORY — PX: VENTRAL HERNIA REPAIR: SHX424

## 2014-03-25 LAB — HEMOGLOBIN AND HEMATOCRIT, BLOOD
HEMATOCRIT: 34.6 % — AB (ref 36.0–46.0)
HEMOGLOBIN: 11.4 g/dL — AB (ref 12.0–15.0)

## 2014-03-25 LAB — BASIC METABOLIC PANEL
Anion gap: 6 (ref 5–15)
BUN: 37 mg/dL — ABNORMAL HIGH (ref 6–23)
CO2: 22 mmol/L (ref 19–32)
Calcium: 8 mg/dL — ABNORMAL LOW (ref 8.4–10.5)
Chloride: 111 mmol/L (ref 96–112)
Creatinine, Ser: 1.17 mg/dL — ABNORMAL HIGH (ref 0.50–1.10)
GFR calc Af Amer: 49 mL/min — ABNORMAL LOW (ref >90)
GFR, EST NON AFRICAN AMERICAN: 43 mL/min — AB (ref >90)
Glucose, Bld: 96 mg/dL (ref 70–99)
Potassium: 4.4 mmol/L (ref 3.5–5.1)
Sodium: 139 mmol/L (ref 135–145)

## 2014-03-25 SURGERY — REPAIR, HERNIA, VENTRAL
Anesthesia: General

## 2014-03-25 MED ORDER — NEOSTIGMINE METHYLSULFATE 10 MG/10ML IV SOLN
INTRAVENOUS | Status: AC
  Filled 2014-03-25: qty 1

## 2014-03-25 MED ORDER — BUPIVACAINE HCL (PF) 0.5 % IJ SOLN
INTRAMUSCULAR | Status: AC
  Filled 2014-03-25: qty 30

## 2014-03-25 MED ORDER — GLYCOPYRROLATE 0.2 MG/ML IJ SOLN
INTRAMUSCULAR | Status: DC | PRN
  Administered 2014-03-25: 0.4 mg via INTRAVENOUS

## 2014-03-25 MED ORDER — ROCURONIUM BROMIDE 100 MG/10ML IV SOLN
INTRAVENOUS | Status: DC | PRN
  Administered 2014-03-25: 25 mg via INTRAVENOUS

## 2014-03-25 MED ORDER — ROCURONIUM BROMIDE 50 MG/5ML IV SOLN
INTRAVENOUS | Status: AC
  Filled 2014-03-25: qty 1

## 2014-03-25 MED ORDER — FENTANYL CITRATE 0.05 MG/ML IJ SOLN
INTRAMUSCULAR | Status: DC | PRN
  Administered 2014-03-25: 50 ug via INTRAVENOUS

## 2014-03-25 MED ORDER — LIDOCAINE HCL (CARDIAC) 20 MG/ML IV SOLN
INTRAVENOUS | Status: DC | PRN
  Administered 2014-03-25: 25 mg via INTRAVENOUS

## 2014-03-25 MED ORDER — MIDAZOLAM HCL 2 MG/2ML IJ SOLN
INTRAMUSCULAR | Status: AC
  Filled 2014-03-25: qty 2

## 2014-03-25 MED ORDER — ONDANSETRON HCL 4 MG/2ML IJ SOLN: 4.0000 mg | Freq: Once | INTRAMUSCULAR | Status: DC | PRN

## 2014-03-25 MED ORDER — ONDANSETRON HCL 4 MG/2ML IJ SOLN
INTRAMUSCULAR | Status: AC
Start: 2014-03-25 — End: 2014-03-25
  Filled 2014-03-25: qty 2

## 2014-03-25 MED ORDER — MIDAZOLAM HCL 5 MG/5ML IJ SOLN
INTRAMUSCULAR | Status: DC | PRN
  Administered 2014-03-25: 1 mg via INTRAVENOUS

## 2014-03-25 MED ORDER — POVIDONE-IODINE 10 % OINT PACKET
TOPICAL_OINTMENT | CUTANEOUS | Status: DC | PRN
  Administered 2014-03-25: 1 via TOPICAL

## 2014-03-25 MED ORDER — LACTATED RINGERS IV SOLN
INTRAVENOUS | Status: DC
  Administered 2014-03-25: 15:00:00 via INTRAVENOUS

## 2014-03-25 MED ORDER — LIDOCAINE HCL (PF) 1 % IJ SOLN
INTRAMUSCULAR | Status: AC
  Filled 2014-03-25: qty 5

## 2014-03-25 MED ORDER — ONDANSETRON HCL 4 MG/2ML IJ SOLN
4.0000 mg | Freq: Once | INTRAMUSCULAR | Status: AC
  Administered 2014-03-25: 4 mg via INTRAVENOUS

## 2014-03-25 MED ORDER — 0.9 % SODIUM CHLORIDE (POUR BTL) OPTIME
TOPICAL | Status: DC | PRN
  Administered 2014-03-25: 1000 mL

## 2014-03-25 MED ORDER — LACTATED RINGERS IV SOLN
INTRAVENOUS | Status: DC
  Administered 2014-03-25: 10:00:00 via INTRAVENOUS

## 2014-03-25 MED ORDER — BUPIVACAINE HCL (PF) 0.5 % IJ SOLN
INTRAMUSCULAR | Status: DC | PRN
  Administered 2014-03-25: 6 mL

## 2014-03-25 MED ORDER — HYDROCODONE-ACETAMINOPHEN 5-325 MG PO TABS: 1.0000 | ORAL_TABLET | ORAL | Status: DC | PRN

## 2014-03-25 MED ORDER — FENTANYL CITRATE 0.05 MG/ML IJ SOLN: 25.0000 ug | INTRAMUSCULAR | Status: DC | PRN

## 2014-03-25 MED ORDER — FENTANYL CITRATE 0.05 MG/ML IJ SOLN
INTRAMUSCULAR | Status: AC
  Filled 2014-03-25: qty 5

## 2014-03-25 MED ORDER — CHLORHEXIDINE GLUCONATE CLOTH 2 % EX PADS
6.0000 | MEDICATED_PAD | Freq: Every day | CUTANEOUS | Status: DC
  Administered 2014-03-26 – 2014-03-27 (×2): 6 via TOPICAL

## 2014-03-25 MED ORDER — POVIDONE-IODINE 10 % EX OINT
TOPICAL_OINTMENT | CUTANEOUS | Status: AC
  Filled 2014-03-25: qty 1

## 2014-03-25 MED ORDER — GLYCOPYRROLATE 0.2 MG/ML IJ SOLN
INTRAMUSCULAR | Status: AC
  Filled 2014-03-25: qty 2

## 2014-03-25 MED ORDER — MUPIROCIN 2 % EX OINT
1.0000 | TOPICAL_OINTMENT | Freq: Two times a day (BID) | CUTANEOUS | Status: DC
Start: 2014-03-25 — End: 2014-03-27
  Administered 2014-03-25 – 2014-03-27 (×5): 1 via NASAL
  Filled 2014-03-25 (×2): qty 22

## 2014-03-25 MED ORDER — PROPOFOL 10 MG/ML IV BOLUS
INTRAVENOUS | Status: AC
  Filled 2014-03-25: qty 20

## 2014-03-25 MED ORDER — NEOSTIGMINE METHYLSULFATE 10 MG/10ML IV SOLN
INTRAVENOUS | Status: DC | PRN
  Administered 2014-03-25: 1 mg via INTRAVENOUS

## 2014-03-25 MED ORDER — MUPIROCIN 2 % EX OINT: 1.0000 "application " | TOPICAL_OINTMENT | Freq: Two times a day (BID) | CUTANEOUS | Status: DC

## 2014-03-25 MED ORDER — MIDAZOLAM HCL 2 MG/2ML IJ SOLN
1.0000 mg | INTRAMUSCULAR | Status: DC | PRN
  Administered 2014-03-25 (×2): 2 mg via INTRAVENOUS
  Filled 2014-03-25: qty 2

## 2014-03-25 MED ORDER — MORPHINE SULFATE 2 MG/ML IJ SOLN: 1.0000 mg | INTRAMUSCULAR | Status: DC | PRN

## 2014-03-25 MED ORDER — ETOMIDATE 2 MG/ML IV SOLN
INTRAVENOUS | Status: DC | PRN
  Administered 2014-03-25: 8 mg via INTRAVENOUS

## 2014-03-25 MED ORDER — ETOMIDATE 2 MG/ML IV SOLN
INTRAVENOUS | Status: AC
  Filled 2014-03-25: qty 10

## 2014-03-25 SURGICAL SUPPLY — 29 items
BAG HAMPER (MISCELLANEOUS) ×4 IMPLANT
CHLORAPREP W/TINT 26ML (MISCELLANEOUS) ×4 IMPLANT
CLOTH BEACON ORANGE TIMEOUT ST (SAFETY) ×4 IMPLANT
COVER LIGHT HANDLE STERIS (MISCELLANEOUS) ×8 IMPLANT
ELECT REM PT RETURN 9FT ADLT (ELECTROSURGICAL) ×4
ELECTRODE REM PT RTRN 9FT ADLT (ELECTROSURGICAL) ×2 IMPLANT
GAUZE SPONGE 4X4 12PLY STRL (GAUZE/BANDAGES/DRESSINGS) ×4 IMPLANT
GLOVE BIOGEL PI IND STRL 7.0 (GLOVE) IMPLANT
GLOVE BIOGEL PI INDICATOR 7.0 (GLOVE) ×4
GLOVE SS BIOGEL STRL SZ 6.5 (GLOVE) IMPLANT
GLOVE SUPERSENSE BIOGEL SZ 6.5 (GLOVE) ×2
GLOVE SURG SS PI 7.5 STRL IVOR (GLOVE) ×4 IMPLANT
GOWN STRL REUS W/ TWL XL LVL3 (GOWN DISPOSABLE) IMPLANT
GOWN STRL REUS W/TWL LRG LVL3 (GOWN DISPOSABLE) ×6 IMPLANT
GOWN STRL REUS W/TWL XL LVL3 (GOWN DISPOSABLE) ×4
INST SET MAJOR GENERAL (KITS) ×4 IMPLANT
KIT ROOM TURNOVER APOR (KITS) ×4 IMPLANT
MANIFOLD NEPTUNE II (INSTRUMENTS) ×4 IMPLANT
MESH VENTRALEX ST 8CM LRG (Mesh General) ×2 IMPLANT
NS IRRIG 1000ML POUR BTL (IV SOLUTION) ×4 IMPLANT
PACK ABDOMINAL MAJOR (CUSTOM PROCEDURE TRAY) ×4 IMPLANT
PAD ARMBOARD 7.5X6 YLW CONV (MISCELLANEOUS) ×4 IMPLANT
SET BASIN LINEN APH (SET/KITS/TRAYS/PACK) ×4 IMPLANT
STAPLER VISISTAT (STAPLE) ×4 IMPLANT
SUT ETHIBOND NAB MO 7 #0 18IN (SUTURE) ×2 IMPLANT
SUT VIC AB 3-0 SH 27 (SUTURE) ×4
SUT VIC AB 3-0 SH 27X BRD (SUTURE) IMPLANT
SYR CONTROL 10ML LL (SYRINGE) ×2 IMPLANT
TAPE CLOTH SURG 4X10 WHT LF (GAUZE/BANDAGES/DRESSINGS) ×2 IMPLANT

## 2014-03-25 NOTE — Anesthesia Postprocedure Evaluation (Signed)
  Anesthesia Post-op Note  Patient: Joyce NashCatherine E Diclemente  Procedure(s) Performed: Procedure(s): VENTRAL HERIORRHAPHY WITH MESH (N/A) INSERTION OF MESH  Patient Location: PACU  Anesthesia Type:General  Level of Consciousness: awake, alert , oriented and patient cooperative  Airway and Oxygen Therapy: Patient Spontanous Breathing and Patient connected to nasal cannula oxygen  Post-op Pain: 1 /10, mild  Post-op Assessment: Post-op Vital signs reviewed, Patient's Cardiovascular Status Stable, Respiratory Function Stable, Patent Airway, No signs of Nausea or vomiting and Pain level controlled  Post-op Vital Signs: Reviewed and stable  Last Vitals:  Filed Vitals:   03/25/14 1319  BP: 149/50  Pulse: 73  Temp:   Resp: 16    Complications: No apparent anesthesia complications

## 2014-03-25 NOTE — Anesthesia Procedure Notes (Signed)
Procedure Name: Intubation Date/Time: 03/25/2014 11:00 AM Performed by: Despina HiddenIDACAVAGE, Hakeem Frazzini J Pre-anesthesia Checklist: Patient identified, Emergency Drugs available, Suction available and Patient being monitored Patient Re-evaluated:Patient Re-evaluated prior to inductionOxygen Delivery Method: Circle system utilized Preoxygenation: Pre-oxygenation with 100% oxygen Ventilation: Mask ventilation without difficulty and Oral airway inserted - appropriate to patient size Laryngoscope Size: Mac and 3 Grade View: Grade II Tube type: Oral Tube size: 7.0 mm Number of attempts: 1 Airway Equipment and Method: Stylet Placement Confirmation: ETT inserted through vocal cords under direct vision,  breath sounds checked- equal and bilateral and positive ETCO2 Secured at: 22 cm Tube secured with: Tape Dental Injury: Teeth and Oropharynx as per pre-operative assessment

## 2014-03-25 NOTE — Op Note (Signed)
Patient:  Joyce Carpenter  DOB:  23-Dec-1930  MRN:  [id number]   Preop Diagnosis:  Ventral hernia  Postop Diagnosis:  Same  Procedure:  Ventral herniorrhaphy with mesh  Surgeon:  Franky MachoMark Donterius Filley, M.D.  Anes:  Gen. endotracheal  Indications:  Patient is an 81 year old black female who presents with a a ventral hernia. She initially was dehydrated with renal insufficiency, but this has been resolving. The patient now comes the operating room for ventral herniorrhaphy with mesh. The risks and benefits of the procedure including bleeding, infection, mesh use, and recurrence of the hernia were fully explained to the patient, who gave informed consent.  Procedure note:  The patient was placed in the supine position. After induction of general endotracheal anesthesia, the abdomen was prepped and draped using usual sterile technique with ChloraPrep. Surgical site confirmation was performed.  A transverse incision was made just above the umbilicus. The easily palpable hernia was reduced. An incision was made over the hernia defect. The hernia defect measured approximately 2-3 cm in its greatest diameter. A well-formed hernia sac was found. This was excised down to the fascia and disposed of. An 8 cm Bard ventralax patch was then inserted and secured to the fascia using 0 Ethibond interrupted sutures. The overlying fascia was closed transversely using 0 Ethibond interrupted sutures. The subcutaneous layer was reapproximated using 3-0 Vicryl interrupted suture. The skin was closed using staples. 0.5% Sensorcaine was instilled the surrounding wound. Betadine ointment and dry sterile dressing were applied.  All tape and needle counts were correct the end of the procedure. Patient was extubated in the operating room and transferred to PACU in stable condition.  Complications:  None  EBL:  Minimal  Specimen:  None

## 2014-03-25 NOTE — Transfer of Care (Signed)
Immediate Anesthesia Transfer of Care Note  Patient: Joyce Carpenter  Procedure(s) Performed: Procedure(s): VENTRAL HERIORRHAPHY WITH MESH (N/A) INSERTION OF MESH  Patient Location: PACU  Anesthesia Type:General  Level of Consciousness: awake and patient cooperative  Airway & Oxygen Therapy: Patient Spontanous Breathing and Patient connected to face mask oxygen  Post-op Assessment: Report given to RN, Post -op Vital signs reviewed and stable and Patient moving all extremities  Post vital signs: Reviewed and stable  Last Vitals:  Filed Vitals:   03/25/14 1045  BP: 142/72  Pulse:   Temp:   Resp: 18    Complications: No apparent anesthesia complications

## 2014-03-25 NOTE — Progress Notes (Signed)
CHARDAY CAPETILLO  MRN: [id number]  DOB/AGE: [birth year] 81 y.o.  Primary Care Physician:DONDIEGO,RICHARD M, MD  Admit date: 03/20/2014  Chief Complaint:  Chief Complaint  Patient presents with  . Abdominal Pain  . Emesis    S-Pt presented on  03/20/2014 with  Chief Complaint  Patient presents with  . Abdominal Pain  . Emesis  .    Pt today feels better  Meds .  ceFAZolin (ANCEF) IV  2 g Intravenous On Call to OR  . Chlorhexidine Gluconate Cloth  6 each Topical Daily  . ferumoxytol  510 mg Intravenous Weekly  . mupirocin ointment  1 application Nasal BID     Physical Exam: Vital signs in last 24 hours: Temp:  [98.3 F (36.8 C)-98.8 F (37.1 C)] 98.4 F (36.9 C) (03/09 0535) Pulse Rate:  [93-99] 99 (03/09 0535) Resp:  [18-20] 18 (03/09 0535) BP: (146-167)/(73-84) 146/75 mmHg (03/09 0535) SpO2:  [100 %] 100 % (03/09 0535) Weight:  [132 lb 7.9 oz (60.1 kg)] 132 lb 7.9 oz (60.1 kg) (03/09 0535) Weight change: -8.1 oz (-0.228 kg) Last BM Date: 03/24/14  Intake/Output from previous day: 03/08 0701 - 03/09 0700 In: 3039.2 [P.O.:480; I.V.:2559.2] Out: 900 [Urine:900]     Physical Exam: General- pt is awake,alert, oriented to time place and person Resp- No acute REsp distress, CTA B/L NO Rhonchi CVS- S1S2 regular in rate and rhythm GIT- BS decreased s/p surgery ( today), soft EXT- NO LE Edema, NO Cyanosis   Lab Results: CBC  Recent Labs  03/24/14 0613 03/25/14 0706  HGB 10.2* 11.4*  HCT 32.2* 34.6*    BMET  Recent Labs  03/24/14 0613 03/25/14 0706  NA 141 139  K 3.8 4.4  CL 111 111  CO2 24 22  GLUCOSE 113* 96  BUN 55* 37*  CREATININE 1.47* 1.17*  CALCIUM 7.7* 8.0*   Creat trend 2016 2.95=>1.47=>1.17 2015  1.39   MICRO Recent Results (from the past 240 hour(s))  Clostridium Difficile by PCR     Status: None   Collection Time: 03/23/14  2:10 PM  Result Value Ref Range Status   C difficile by pcr NEGATIVE NEGATIVE Final  Surgical pcr  screen     Status: Abnormal   Collection Time: 03/24/14  1:15 PM  Result Value Ref Range Status   MRSA, PCR NEGATIVE NEGATIVE Final   Staphylococcus aureus POSITIVE (A) NEGATIVE Final    Comment:        The Xpert SA Assay (FDA approved for NASAL specimens in patients over 79 years of age), is one component of a comprehensive surveillance program.  Test performance has been validated by [facility] for patients greater than or equal to 79 year old. It is not intended to diagnose infection nor to guide or monitor treatment. RESULT CALLED TO, READ BACK BY AND VERIFIED WITH: LEFT MESSAGE FOR [name] AT 1924 ON 03/24/2014 BY BAUGHAM,M.       Lab Results  Component Value Date   CALCIUM 8.0* 03/25/2014   PHOS 4.7* 03/22/2014               Impression: 1)Renal  AKI secondary to Prerenal/ATN               AKI now improving               Creat much better               AKI on  CKD  CKD stage 3.               CKD since not sure                CKD secondary to HTN/ Age associated decline                Progression of CKD marked with AKI                2)HTN BP stable   3)Anemia HGb at goal (9--11) FOBT + -Primary MD following On IV iron  4)CKD Mineral-Bone Disorder Phosphorus at goal. Calcium low-will repeat albumin  5)GI- admitted with partial SBO Primary MD following  6)Electrolytes Normokalemic NOrmonatremic   7)Acid base Co2 at goal     Plan:  Will continue current care. Will ask for albumin in am      BHUTANI,MANPREET S 03/25/2014, 8:52 AM

## 2014-03-25 NOTE — Progress Notes (Signed)
Scheduled for hernia repair with mesh screen this morning potassium 3.8 creatinine 1.47 hemoglobin 10.2 hemodynamically stable. Garner NashCatherine E Mccaster [id number] DOB: 05/05/1930 DOA: 03/20/2014 PCP: Isabella StallingNDIEGO,Petro Talent M, MD             Physical Exam: Blood pressure 146/75, pulse 99, temperature 98.4 F (36.9 C), temperature source Oral, resp. rate 18, height 5\' 3"  (1.6 m), weight 132 lb 7.9 oz (60.1 kg), SpO2 100 %. neck no JVD no carotid bruits no thyromegaly lungs clear to A&P no rales wheeze rhonchi appreciable heart regular rhythm no S3 or S4 no heaves thrills rubs abdomen soft nontender bowel sounds normoactive temperature protruberant hernia reducible   Investigations:  Recent Results (from the past 240 hour(s))  Clostridium Difficile by PCR     Status: None   Collection Time: 03/23/14  2:10 PM  Result Value Ref Range Status   C difficile by pcr NEGATIVE NEGATIVE Final  Surgical pcr screen     Status: Abnormal   Collection Time: 03/24/14  1:15 PM  Result Value Ref Range Status   MRSA, PCR NEGATIVE NEGATIVE Final   Staphylococcus aureus POSITIVE (A) NEGATIVE Final    Comment:        The Xpert SA Assay (FDA approved for NASAL specimens in patients over 79 years of age), is one component of a comprehensive surveillance program.  Test performance has been validated by [facility] for patients greater than or equal to 79 year old. It is not intended to diagnose infection nor to guide or monitor treatment. RESULT CALLED TO, READ BACK BY AND VERIFIED WITH: LEFT MESSAGE FOR [name] AT 1924 ON 03/24/2014 BY BAUGHAM,M.      Basic Metabolic Panel:  Recent Labs  95/62/1301/07/31 0912 03/24/14 0613  NA 143 141  K 3.4* 3.8  CL 112 111  CO2 23 24  GLUCOSE 121* 113*  BUN 70* 55*  CREATININE 1.96* 1.47*  CALCIUM 8.0* 7.7*   Liver Function Tests: No results for input(s): AST, ALT, ALKPHOS, BILITOT, PROT, ALBUMIN in the last 72 hours.   CBC:  Recent Labs  03/23/14 0912  03/24/14 0613  HGB 10.7* 10.2*  HCT 32.5* 32.2*    No results found.    Medications:  Impression:  Principal Problem:   Partial small bowel obstruction Active Problems:   Hypertension   Umbilical hernia   Nausea & vomiting   Abdominal pain, acute   Iron deficiency anemia   Acute renal failure   Essential hypertension     Plan: For surgery this a.m.  Consultants: Nephrology and general surgery    Procedures hernia repair this a.m.   Antibiotics:                   Code Status:  Family Communication:  Spoke with sister at length and patient yeste  Disposition Plan is for surgery this a.m. hemodynamically stable  Time spent: 30 minutes   LOS: 5 days   Edras Wilford M   03/25/2014, 6:50 AM

## 2014-03-25 NOTE — Anesthesia Preprocedure Evaluation (Signed)
Anesthesia Evaluation  Patient identified by MRN, date of birth, ID band Patient awake    Reviewed: Allergy & Precautions, NPO status , Patient's Chart, lab work & pertinent test results  Airway Mallampati: II  TM Distance: >3 FB     Dental  (+) Poor Dentition, Missing   Pulmonary neg pulmonary ROS,  breath sounds clear to auscultation        Cardiovascular hypertension, Pt. on medications Rhythm:Regular Rate:Normal     Neuro/Psych TIA   GI/Hepatic   Endo/Other    Renal/GU Renal disease     Musculoskeletal   Abdominal   Peds  Hematology  (+) anemia ,   Anesthesia Other Findings   Reproductive/Obstetrics                             Anesthesia Physical Anesthesia Plan  ASA: III  Anesthesia Plan: General   Post-op Pain Management:    Induction: Intravenous  Airway Management Planned: Oral ETT  Additional Equipment:   Intra-op Plan:   Post-operative Plan: Extubation in OR  Informed Consent: I have reviewed the patients History and Physical, chart, labs and discussed the procedure including the risks, benefits and alternatives for the proposed anesthesia with the patient or authorized representative who has indicated his/her understanding and acceptance.     Plan Discussed with:   Anesthesia Plan Comments:         Anesthesia Quick Evaluation

## 2014-03-26 ENCOUNTER — Encounter (HOSPITAL_COMMUNITY): Payer: Self-pay | Admitting: General Surgery

## 2014-03-26 LAB — ALBUMIN: Albumin: 2 g/dL — ABNORMAL LOW (ref 3.5–5.2)

## 2014-03-26 LAB — CBC
HEMATOCRIT: 32.2 % — AB (ref 36.0–46.0)
Hemoglobin: 10.1 g/dL — ABNORMAL LOW (ref 12.0–15.0)
MCH: 28.6 pg (ref 26.0–34.0)
MCHC: 31.4 g/dL (ref 30.0–36.0)
MCV: 91.2 fL (ref 78.0–100.0)
PLATELETS: 277 10*3/uL (ref 150–400)
RBC: 3.53 MIL/uL — AB (ref 3.87–5.11)
RDW: 15.7 % — AB (ref 11.5–15.5)
WBC: 11.5 10*3/uL — ABNORMAL HIGH (ref 4.0–10.5)

## 2014-03-26 LAB — BASIC METABOLIC PANEL
Anion gap: 5 (ref 5–15)
BUN: 36 mg/dL — ABNORMAL HIGH (ref 6–23)
CO2: 22 mmol/L (ref 19–32)
Calcium: 7.9 mg/dL — ABNORMAL LOW (ref 8.4–10.5)
Chloride: 111 mmol/L (ref 96–112)
Creatinine, Ser: 1.31 mg/dL — ABNORMAL HIGH (ref 0.50–1.10)
GFR calc non Af Amer: 37 mL/min — ABNORMAL LOW (ref >90)
GFR, EST AFRICAN AMERICAN: 43 mL/min — AB (ref >90)
GLUCOSE: 119 mg/dL — AB (ref 70–99)
Potassium: 4.3 mmol/L (ref 3.5–5.1)
SODIUM: 138 mmol/L (ref 135–145)

## 2014-03-26 MED ORDER — SODIUM CHLORIDE 0.9 % IV SOLN
INTRAVENOUS | Status: DC
  Administered 2014-03-26: 10:00:00 via INTRAVENOUS

## 2014-03-26 NOTE — Anesthesia Postprocedure Evaluation (Signed)
  Anesthesia Post-op Note  Patient: Joyce Carpenter  Procedure(s) Performed: Procedure(s): VENTRAL HERIORRHAPHY WITH MESH (N/A) INSERTION OF MESH  Patient Location: room 305  Anesthesia Type:General  Level of Consciousness: awake, alert , oriented and patient cooperative  Airway and Oxygen Therapy: Patient Spontanous Breathing and Patient connected to nasal cannula oxygen  Post-op Pain: 2 /10, mild  Post-op Assessment: Post-op Vital signs reviewed, Patient's Cardiovascular Status Stable, Respiratory Function Stable, Patent Airway, No signs of Nausea or vomiting, Adequate PO intake and Pain level controlled  Post-op Vital Signs: Reviewed and stable  Last Vitals:  Filed Vitals:   03/26/14 0400  BP: 133/52  Pulse: 100  Temp: 37.6 C  Resp: 20    Complications: No apparent anesthesia complications

## 2014-03-26 NOTE — Care Management Utilization Note (Signed)
UR completed 

## 2014-03-26 NOTE — Progress Notes (Signed)
Subjective: Interval History: Her abdominal pain is better. Patient presently does not have any nausea no vomiting and no difficulty in breathing.  Objective: Vital signs in last 24 hours: Temp:  [97.7 F (36.5 C)-100.4 F (38 C)] 99.7 F (37.6 C) (03/10 0400) Pulse Rate:  [67-100] 100 (03/10 0400) Resp:  [11-23] 20 (03/10 0400) BP: (103-188)/(50-104) 133/52 mmHg (03/10 0400) SpO2:  [94 %-100 %] 100 % (03/10 0400) Weight:  [65.5 kg (144 lb 6.4 oz)] 65.5 kg (144 lb 6.4 oz) (03/10 0400) Weight change: 5.4 kg (11 lb 14.5 oz)  Intake/Output from previous day: 03/09 0701 - 03/10 0700 In: 2791.7 [P.O.:565; I.V.:2226.7] Out: 250 [Urine:250] Intake/Output this shift:    Generally she is alert and in no apparent distress. Chest is clear to auscultation Heart exam revealed regular rate and rhythm no murmur Abdomen: Status post surgery and positive bowel sound Extremities no edema  Lab Results:  Recent Labs  03/25/14 0706 03/26/14 0529  WBC  --  11.5*  HGB 11.4* 10.1*  HCT 34.6* 32.2*  PLT  --  277   BMET:   Recent Labs  03/25/14 0706 03/26/14 0529  NA 139 138  K 4.4 4.3  CL 111 111  CO2 22 22  GLUCOSE 96 119*  BUN 37* 36*  CREATININE 1.17* 1.31*  CALCIUM 8.0* 7.9*   No results for input(s): PTH in the last 72 hours. Iron Studies:  No results for input(s): IRON, TIBC, TRANSFERRIN, FERRITIN in the last 72 hours.  Studies/Results: No results found.  I have reviewed the patient's current medications.  Assessment/Plan: Problem #1 acute kidney injury: Possibly ATN versus prerenal. Creatinine slightly high today otherwise everything stable. Presently this seems to be her baseline. Patient does not have any uremic sinus symptoms. Problem #2 hypertension: Her blood pressure is reasonably controlled Problem #3 small bowel obstruction: Seems to be improving. Problem #4 anemia: At this moment seems to be iron deficiency anemia . Her hemoglobin is within our target  goal Problem #5 chronic renal failure: Possibly stage III Problem #6 metabolic bone disease her calcium and her phosphorus is in range Problem #7 diarrhea: Patient does not have any more of diarrhea. Problem #8 hypokalemia : Potassium is normal. Plan: We'll DC Ringer's lactate solution We'll put her on normal saline at 75 mL per hour We'll check her basic metabolic panel in the morning.    LOS: 6 days   Brandn Mcgath S 03/26/2014,9:26 AM

## 2014-03-26 NOTE — Progress Notes (Signed)
Patient alert and oriented this morning talkative. Joyce Carpenter [id number] DOB: 04-Mar-1930 DOA: 03/20/2014 PCP: Isabella StallingNDIEGO,[name], MD             Physical Exam: Blood pressure 133/52, pulse 100, temperature 99.7 F (37.6 C), temperature source Oral, resp. rate 20, height 5\' 3"  (1.6 Carpenter), weight 132 lb 7.9 oz (60.1 kg), SpO2 100 %. lungs no rales wheeze rhonchi appreciable heart regular rhythm no murmurs gallops measles rubs extremities 1+ chronic pitting edema   Investigations:  Recent Results (from the past 240 hour(s))  Clostridium Difficile by PCR     Status: None   Collection Time: 03/23/14  2:10 PM  Result Value Ref Range Status   C difficile by pcr NEGATIVE NEGATIVE Final  Surgical pcr screen     Status: Abnormal   Collection Time: 03/24/14  1:15 PM  Result Value Ref Range Status   MRSA, PCR NEGATIVE NEGATIVE Final   Staphylococcus aureus POSITIVE (A) NEGATIVE Final    Comment:        The Xpert SA Assay (FDA approved for NASAL specimens in patients over 79 years of age), is one component of a comprehensive surveillance program.  Test performance has been validated by [facility] for patients greater than or equal to 79 year old. It is not intended to diagnose infection nor to guide or monitor treatment. RESULT CALLED TO, READ BACK BY AND VERIFIED WITH: LEFT MESSAGE FOR [name] AT 1924 ON 03/24/2014 BY BAUGHAM,Carpenter.      Basic Metabolic Panel:  Recent Labs  28/41/3201/10/01 0706 03/26/14 0529  NA 139 138  K 4.4 4.3  CL 111 111  CO2 22 22  GLUCOSE 96 119*  BUN 37* 36*  CREATININE 1.17* 1.31*  CALCIUM 8.0* 7.9*   Liver Function Tests:  Recent Labs  03/26/14 0529  ALBUMIN 2.0*     CBC:  Recent Labs  03/25/14 0706 03/26/14 0529  WBC  --  11.5*  HGB 11.4* 10.1*  HCT 34.6* 32.2*  MCV  --  91.2  PLT  --  277    No results found.    Medications:   Impression: Ventral hernia repair  Principal Problem:   Partial small bowel  obstruction Active Problems:   Hypertension   Umbilical hernia   Nausea & vomiting   Abdominal pain, acute   Iron deficiency anemia   Acute renal failure   Essential hypertension     Plan: Crease activity and diet physical therapy consultation and consideration of SNF placement due to her advanced age and debility   Consultants: Surgery    Procedures   Antibiotics:                   Code Status:  Family Communication:  Spoke with daughter in rooom  Disposition Plan increase activity and diet as tolerated consideration of rehabilitation stay due to advanced age and debility  Time spent: 30 minutes   LOS: 6 days   Joyce Carpenter   03/26/2014, 7:09 AM

## 2014-03-26 NOTE — Evaluation (Addendum)
Physical Therapy Evaluation Patient Details Name: Joyce Carpenter MRN: [id number] DOB: 22-Sep-1930 Today's Date: 03/26/2014   History of Present Illness  81 yo pleasant healthy female h/o htn and periumbilical hernia comes in with worsening periumbilical abd pain throughout the day with associated nausea and nonbloody vomiting. Her hernia has gotten bigger today, she says it "normally goes down" and is never this big. Denies any fevers. No diarrhea. Positive flatus. She lives with her sister and is very active. Her hernia is reducible and ct scan shows psbo caused by the hernia. She has had one episode of vomiting since arrival to the ED, and her pain is improved. No overt bleeding history.  Clinical Impression  Patient very pleasant and willing to participate in skilled PT services. See note for levels of assist. Patient demonstrates significant gait impairments but states that this is her normal level of function for transfers and gait. Postural impairments present. Education regarding need for [facility] to remain elevated at this time per orders of medical staff. Patient very safe and steady in sitting; patient and family agreeable to [place] upon discharge from facility. Patient left sitting edge of bed to eat lunch with family close by and CNA student having just entered room; all needs otherwise met and call bell within reach. Patient to benefit from continued skilled PT services during her stay at this facilty.     Follow Up Recommendations Home health PT    Equipment Recommendations  None recommended by PT    Recommendations for Other Services OT consult     Precautions / Restrictions Precautions Precautions: Other (comment);Fall Precaution Comments: Hernia repair Restrictions Weight Bearing Restrictions: No      Mobility  Bed Mobility Overal bed mobility: Needs Assistance Bed Mobility: Supine to Sit     Supine to sit: Min assist        Transfers Overall transfer  level: Needs assistance Equipment used: Standard walker Transfers: Sit to/from Stand Sit to Stand: Min assist         General transfer comment: cues for appropriate hand placement  Ambulation/Gait Ambulation/Gait assistance: Min guard for approximately 48f.    Assistive device: Standard walker Gait Pattern/deviations: Decreased step length - right;Decreased step length - left;Step-through pattern;Decreased stride length;Decreased dorsiflexion - right;Decreased dorsiflexion - left;Trunk flexed;Drifts right/left     General Gait Details: external rotation of hips and pronating gait pattern  Stairs            Wheelchair Mobility    Modified Rankin (Stroke Patients Only)       Balance Overall balance assessment: No apparent balance deficits (not formally assessed);History of Falls                                           Pertinent Vitals/Pain Pain Assessment: No/denies pain    Home Living Family/patient expects to be discharged to:: Private residence Living Arrangements: Other relatives (sister) Available Help at Discharge: Family Type of Home: House Home Access: Ramped entrance     Home Layout: One level Home Equipment: SClinical cytogeneticist- 2 wheels;Cane - quad;Bedside commode      Prior Function                 Hand Dominance        Extremity/Trunk Assessment               Lower Extremity Assessment: Overall [facility]  for tasks assessed      Cervical / Trunk Assessment: Normal  Communication      Cognition Arousal/Alertness: Awake/alert Behavior During Therapy: WFL for tasks assessed/performed Overall Cognitive Status: Within Functional Limits for tasks assessed                      General Comments General comments (skin integrity, edema, etc.): patient appears to have fallen arches; history of falls but patient fully cognizant regarding circumstances that led up to falls and has not had any for over 6  months. No balance deficits noted at eval     Exercises        Assessment/Plan    PT Assessment Patient needs continued PT services  PT Diagnosis Difficulty walking;Abnormality of gait;Generalized weakness   PT Problem List Decreased strength;Decreased coordination;Decreased activity tolerance;Decreased knowledge of use of DME;Decreased safety awareness;Decreased mobility;Decreased knowledge of precautions  PT Treatment Interventions DME instruction;Therapeutic exercise;Gait training;Balance training;Neuromuscular re-education;Functional mobility training;Therapeutic activities;Patient/family education   PT Goals (Current goals can be found in the Care Plan section) Acute Rehab PT Goals Patient Stated Goal: to go home PT Goal Formulation: With patient Time For Goal Achievement: 04/09/14 Potential to Achieve Goals: Good    Frequency Min 3X/week   Barriers to discharge        Co-evaluation               End of Session Equipment Utilized During Treatment: Gait belt Activity Tolerance: Patient tolerated treatment well Patient left: in bed;with call bell/phone within reach;with nursing/sitter in room;with family/visitor present;Other (comment) (patient left sitting at edge of bed with family present and CNA student in room)           Time: 9584-4171 PT Time Calculation (min) (ACUTE ONLY): 21 min   Charges:   PT Evaluation $Initial PT Evaluation Tier I: 1 Procedure     PT G CodesDeniece Ree PT, DPT [phone]  03/26/14: See gait section. -KEU

## 2014-03-26 NOTE — Progress Notes (Signed)
1 Day Post-Op  Subjective: Patient feels fine. She is hungry. Has had bowel movements since surgery. Denies any abdominal pain.  Objective: Vital signs in last 24 hours: Temp:  [97.7 F (36.5 C)-100.4 F (38 C)] 99.7 F (37.6 C) (03/10 0400) Pulse Rate:  [67-100] 100 (03/10 0400) Resp:  [11-23] 20 (03/10 0400) BP: (103-188)/(50-104) 133/52 mmHg (03/10 0400) SpO2:  [94 %-100 %] 100 % (03/10 0400) Weight:  [65.5 kg (144 lb 6.4 oz)] 65.5 kg (144 lb 6.4 oz) (03/10 0400) Last BM Date: 03/25/14  Intake/Output from previous day: 03/09 0701 - 03/10 0700 In: 2791.7 [P.O.:565; I.V.:2226.7] Out: 250 [Urine:250] Intake/Output this shift:    General appearance: alert, cooperative and no distress GI: Soft. Dressing dry and intact. Nontender.  Lab Results:   Recent Labs  03/25/14 0706 03/26/14 0529  WBC  --  11.5*  HGB 11.4* 10.1*  HCT 34.6* 32.2*  PLT  --  277   BMET  Recent Labs  03/25/14 0706 03/26/14 0529  NA 139 138  K 4.4 4.3  CL 111 111  CO2 22 22  GLUCOSE 96 119*  BUN 37* 36*  CREATININE 1.17* 1.31*  CALCIUM 8.0* 7.9*   PT/INR No results for input(s): LABPROT, INR in the last 72 hours.  Studies/Results: No results found.  Anti-infectives: Anti-infectives    Start     Dose/Rate Route Frequency Ordered Stop   03/25/14 0600  ceFAZolin (ANCEF) IVPB 2 g/50 mL premix     2 g 100 mL/hr over 30 Minutes Intravenous On call to O.R. 03/24/14 0912 03/25/14 1040   03/24/14 0915  ceFAZolin (ANCEF) IVPB 2 g/50 mL premix  Status:  Discontinued     2 g 100 mL/hr over 30 Minutes Intravenous On call to O.R. 03/24/14 0901 03/24/14 0912      Assessment/Plan: s/p Procedure(s): VENTRAL HERIORRHAPHY WITH MESH INSERTION OF MESH Impression: Doing well postoperatively. We'll advance diet. If tolerating diet well, is cleared for discharge from surgery standpoint.  LOS: 6 days    Zanyia Silbaugh A 03/26/2014

## 2014-03-27 LAB — BASIC METABOLIC PANEL
Anion gap: 6 (ref 5–15)
BUN: 39 mg/dL — AB (ref 6–23)
CALCIUM: 7.8 mg/dL — AB (ref 8.4–10.5)
CO2: 19 mmol/L (ref 19–32)
Chloride: 113 mmol/L — ABNORMAL HIGH (ref 96–112)
Creatinine, Ser: 1.41 mg/dL — ABNORMAL HIGH (ref 0.50–1.10)
GFR calc Af Amer: 39 mL/min — ABNORMAL LOW (ref >90)
GFR, EST NON AFRICAN AMERICAN: 34 mL/min — AB (ref >90)
GLUCOSE: 110 mg/dL — AB (ref 70–99)
Potassium: 4 mmol/L (ref 3.5–5.1)
Sodium: 138 mmol/L (ref 135–145)

## 2014-03-27 LAB — UIFE/LIGHT CHAINS/TP QN, 24-HR UR
ALBUMIN, U: DETECTED
ALPHA 1 UR: DETECTED — AB
Alpha 2, Urine: DETECTED — AB
BETA UR: DETECTED — AB
Gamma Globulin, Urine: DETECTED — AB
TOTAL PROTEIN, URINE-UR/DAY: 2893 mg/d — AB (ref <150)
Time: 24 hours
Total Protein, Urine: 263 mg/dL — ABNORMAL HIGH (ref 5–24)
Volume, Urine: 1100 mL

## 2014-03-27 LAB — CBC
HCT: 29.5 % — ABNORMAL LOW (ref 36.0–46.0)
Hemoglobin: 9.3 g/dL — ABNORMAL LOW (ref 12.0–15.0)
MCH: 29.2 pg (ref 26.0–34.0)
MCHC: 31.5 g/dL (ref 30.0–36.0)
MCV: 92.8 fL (ref 78.0–100.0)
PLATELETS: 272 10*3/uL (ref 150–400)
RBC: 3.18 MIL/uL — AB (ref 3.87–5.11)
RDW: 15.4 % (ref 11.5–15.5)
WBC: 14 10*3/uL — AB (ref 4.0–10.5)

## 2014-03-27 MED ORDER — HYDROCODONE-ACETAMINOPHEN 5-325 MG PO TABS: 1.0000 | ORAL_TABLET | ORAL | Status: DC | PRN

## 2014-03-27 NOTE — Progress Notes (Signed)
Subjective: Interval History: Her abdominal pain is better. Patient denies any difficulty breathing. She does not have any nausea or vomiting today  Objective: Vital signs in last 24 hours: Temp:  [97.9 F (36.6 C)-99.1 F (37.3 C)] 98.9 F (37.2 C) (03/11 0525) Pulse Rate:  [78-88] 82 (03/11 0525) Resp:  [20] 20 (03/11 0525) BP: (134-143)/(51-56) 143/55 mmHg (03/11 0525) SpO2:  [100 %] 100 % (03/11 0525) Weight:  [65.809 kg (145 lb 1.3 oz)] 65.809 kg (145 lb 1.3 oz) (03/11 0525) Weight change: 0.309 kg (10.9 oz)  Intake/Output from previous day: 03/10 0701 - 03/11 0700 In: 1351.3 [P.O.:720; I.V.:631.3] Out: -  Intake/Output this shift: Total I/O In: 946.3 [I.V.:946.3] Out: -   Generally she is alert and in no apparent distress. Chest is clear to auscultation Heart exam revealed regular rate and rhythm no murmur Abdomen: Status post surgery and positive bowel sound Extremities no edema  Lab Results:  Recent Labs  03/26/14 0529 03/27/14 0526  WBC 11.5* 14.0*  HGB 10.1* 9.3*  HCT 32.2* 29.5*  PLT 277 272   BMET:   Recent Labs  03/26/14 0529 03/27/14 0526  NA 138 138  K 4.3 4.0  CL 111 113*  CO2 22 19  GLUCOSE 119* 110*  BUN 36* 39*  CREATININE 1.31* 1.41*  CALCIUM 7.9* 7.8*   No results for input(s): PTH in the last 72 hours. Iron Studies:  No results for input(s): IRON, TIBC, TRANSFERRIN, FERRITIN in the last 72 hours.  Studies/Results: No results found.  I have reviewed the patient's current medications.  Assessment/Plan: Problem #1 acute kidney injury: Possibly ATN versus prerenal. Creatinine continue to increase slightly.. Presently this seems to be her baseline. Patient with poor by mouth take. Problem #2 hypertension: Her blood pressure is reasonably controlled Problem #3 small bowel obstruction: Seems to be improving. Problem #4 anemia: At this moment seems to be iron deficiency anemia . Her hemoglobin is within our target goal Problem #5  chronic renal failure: Possibly stage III Problem #6 metabolic bone disease her calcium and her phosphorus is in range Problem #7 diarrhea: Patient does not have any more of diarrhea. Problem #8 hypokalemia : Potassium is normal. Plan: We'll continue his present management We'll check her basic metabolic panel in the morning.    LOS: 7 days   Joyce Carpenter S 03/27/2014,11:16 AM

## 2014-03-27 NOTE — Discharge Summary (Signed)
Physician Discharge Summary  Joyce Carpenter ZOX:[id number] DOB: 1930/04/16 DOA: 03/20/2014  PCP: Joyce StallingNDIEGO,Joyce Carpenter  Admit date: 03/20/2014 Discharge date: 03/27/2014   Recommendations for Outpatient Follow-up:  Patient will be discharged with home health nurse RN and physical therapy to attend to her and instruct her she does have some dementia need help following medical regimen postoperatively follow-up in my office in one week's time Discharge Diagnoses:  Principal Problem:   Partial small bowel obstruction Active Problems:   Hypertension   Umbilical hernia   Nausea & vomiting   Abdominal pain, acute   Iron deficiency anemia   Acute renal failure   Essential hypertension   Discharge Condition: Good  Filed Weights   03/25/14 0535 03/26/14 0400 03/27/14 0525  Weight: 132 lb 7.9 oz (60.1 kg) 144 lb 6.4 oz (65.5 kg) 145 lb 1.3 oz (65.809 kg)    History of present illness:  81 year old black woman with hypertension hyperlipidemia and some to moderate dementia of Alzheimer's type who was admitted with the ventral hernia and partial small bowel obstruction secondary to that she was placed nothing by mouth and given IV fluids hernia was reduced by surgery and in the ER and subsequently had ventral herniorrhaphy repair which she did well she was given a medically stable throughout the hospital found to have chronic anemia of chronic disease with concomitant iron deficiency and started on iron supplementation she was discharged with home health care nursing and physical therapy and will follow-up in the office in one week's time  Hospital Course:  See history of present illness  Procedures:  Ventral hernia repair  Consultations:  Gen. surgery and nephrology  Discharge Instructions  Discharge Instructions    Discharge instructions    Complete by:  As directed      Discharge patient    Complete by:  As directed             Medication List    STOP taking these  medications        naproxen 500 MG tablet  Commonly known as:  NAPROSYN      TAKE these medications        amLODipine 5 MG tablet  Commonly known as:  NORVASC  Take 5 mg by mouth daily.     aspirin EC 81 MG tablet  Take 81 mg by mouth daily.     atorvastatin 20 MG tablet  Commonly known as:  LIPITOR  Take 20 mg by mouth daily.     cloNIDine 0.1 MG tablet  Commonly known as:  CATAPRES  Take 0.1 mg by mouth 2 (two) times daily.     CVS SPECTRAVITE ADULT 50+ Tabs  Take 1 tablet by mouth daily.     ferrous sulfate 325 (65 FE) MG tablet  Take 325 mg by mouth daily with breakfast.     HYDROcodone-acetaminophen 5-325 MG per tablet  Commonly known as:  NORCO/VICODIN  Take 1 tablet by mouth every 4 (four) hours as needed for moderate pain.     lisinopril-hydrochlorothiazide 20-12.5 MG per tablet  Commonly known as:  PRINZIDE,ZESTORETIC  Take 1 tablet by mouth daily.       No Known Allergies     Follow-up Information    Follow up with Joyce Carpenter. Schedule an appointment as soon as possible for a visit on 04/07/2014.   Specialty:  General Surgery   Contact information:   [address] [place] [address] [phone]  Follow up with [facility].   Contact information:   [address] [place] [address] [phone]        The results of significant diagnostics from this hospitalization (including imaging, microbiology, ancillary and laboratory) are listed below for reference.    Significant Diagnostic Studies: Ct Abdomen Pelvis Wo Contrast  03/20/2014   CLINICAL DATA:  Small bowel obstruction. Vomiting. Abdominal pain. Large ventral hernia.  EXAM: CT ABDOMEN AND PELVIS WITHOUT CONTRAST  TECHNIQUE: Multidetector CT imaging of the abdomen and pelvis was performed following the standard protocol without IV contrast.  COMPARISON:  None.  FINDINGS: Lower chest:  Unremarkable.  Hepatobiliary: No mass visualized on this  unenhanced exam. High attenuation sludge or tiny stones seen within the gallbladder. No evidence of cholecystitis.  Pancreas: No mass or inflammatory process visualized on this unenhanced exam.  Spleen:  Within normal limits in size.  Adrenal Glands:  No masses identified.  Kidneys/Urinary tract: No evidence of urolithiasis or hydronephrosis.  Stomach/Bowel/Peritoneum: Multiple fluid-filled dilated small bowel loops are seen within the abdomen and pelvis. A transition point is seen at a moderate size periumbilical hernia containing multiple small bowel loops. No evidence of small bowel wall thickening or pneumatosis. Diffuse mesenteric and body wall edema noted, but no focal inflammatory process or abscess identified.  Vascular/Lymphatic: No pathologically enlarged lymph nodes identified. No other significant abnormality identified.  Reproductive: Uterine fibroids seen, largest measuring approximately 5 cm.  Other:  None.  Musculoskeletal:  No suspicious bone lesions identified.  IMPRESSION: Distal small bowel obstruction due to periumbilical hernia containing several small bowel loops.  Gallbladder sludge versus tiny gallstones. No radiographic evidence of acute cholecystitis.  Diffuse mesenteric and body wall edema.  Uterine fibroids.   Electronically Signed   By: Myles Rosenthal M.D.   On: 03/20/2014 20:51   Dg Abd Acute W/chest  03/20/2014   CLINICAL DATA:  Vomiting, central abdominal pain, abdominal distension, and cough for 2 days, history hypertension  EXAM: ACUTE ABDOMEN SERIES (ABDOMEN 2 VIEW & CHEST 1 VIEW)  COMPARISON:  Chest radiograph 10/08/2013  FINDINGS: Enlargement of cardiac silhouette.  Atherosclerotic calcification aorta.  Mediastinal contours and pulmonary vascularity normal.  Lungs clear.  No pleural effusion or pneumothorax.  Multiple air-filled dilated loops of small bowel with paucity of colonic gas consistent with small bowel obstruction.  No bowel wall thickening or free intraperitoneal air.   Mild gastric distention noted as well.  Numerous pelvic phleboliths.  Degenerative disc disease changes lumbar spine.  IMPRESSION: Small bowel obstruction.  Enlargement of cardiac silhouette.   Electronically Signed   By: [name]   On: 03/20/2014 17:44    Microbiology: Recent Results (from the past 240 hour(s))  Clostridium Difficile by PCR     Status: None   Collection Time: 03/23/14  2:10 PM  Result Value Ref Range Status   C difficile by pcr NEGATIVE NEGATIVE Final  Surgical pcr screen     Status: Abnormal   Collection Time: 03/24/14  1:15 PM  Result Value Ref Range Status   MRSA, PCR NEGATIVE NEGATIVE Final   Staphylococcus aureus POSITIVE (A) NEGATIVE Final    Comment:        The Xpert SA Assay (FDA approved for NASAL specimens in patients over 88 years of age), is one component of a comprehensive surveillance program.  Test performance has been validated by [facility] for patients greater than or equal to 1 year old. It is not intended to diagnose  infection nor to guide or monitor treatment. RESULT CALLED TO, READ BACK BY AND VERIFIED WITH: LEFT MESSAGE FOR [name] AT 1924 ON 03/24/2014 BY BAUGHAM,M.      Labs: Basic Metabolic Panel:  Recent Labs Lab 03/22/14 0558 03/23/14 0912 03/24/14 0613 03/25/14 0706 03/26/14 0529  NA 141 143 141 139 138  K 4.1 3.4* 3.8 4.4 4.3  CL 109 112 111 111 111  CO2 GLUCOSE 81 121* 113* 96 119*  BUN 83* 70* 55* 37* 36*  CREATININE 2.81* 1.96* 1.47* 1.17* 1.31*  CALCIUM 8.1* 8.0* 7.7* 8.0* 7.9*  PHOS 4.7*  --   --   --   --    Liver Function Tests:  Recent Labs Lab 03/20/14 1602 03/26/14 0529  AST 30  --   ALT 18  --   ALKPHOS 60  --   BILITOT 0.7  --   PROT 6.8  --   ALBUMIN 3.3* 2.0*    Recent Labs Lab 03/20/14 1602  LIPASE 24   No results for input(s): AMMONIA in the last 168 hours. CBC:  Recent Labs Lab 03/20/14 1602 03/21/14 1610 03/22/14 0558 03/23/14 0912 03/24/14 0613  03/25/14 0706 03/26/14 0529  WBC 7.9 4.9  --   --   --   --  11.5*  NEUTROABS 5.7  --   --   --   --   --   --   HGB 7.7* 7.4* 7.0* 10.7* 10.2* 11.4* 10.1*  HCT 24.2* 23.4* 21.9* 32.5* 32.2* 34.6* 32.2*  MCV 94.9 95.1  --   --   --   --  91.2  PLT 284 270  --   --   --   --  277   Cardiac Enzymes: No results for input(s): CKTOTAL, CKMB, CKMBINDEX, TROPONINI in the last 168 hours. BNP: BNP (last 3 results) No results for input(s): BNP in the last 8760 hours.  ProBNP (last 3 results)  Recent Labs  10/08/13 0216  PROBNP 531.4*    CBG: No results for input(s): GLUCAP in the last 168 hours.     Signed:  Mick Tanguma Judie Petit  [facility] Pager: [phone] 03/27/2014, 6:47 AM

## 2014-03-27 NOTE — Progress Notes (Signed)
Pt discharged home today per Dr. Dondiego. Pt's IV site D/C'd and WDL. Pt's VSS. Pt provided with home medication list, discharge instructions and prescriptions. Verbalized understanding. Pt left floor via WC in stable condition accompanied by NT. 

## 2014-03-27 NOTE — Discharge Instructions (Signed)
Open Hernia Repair, Care After °Refer to this sheet in the next few weeks. These instructions provide you with information on caring for yourself after your procedure. Your health care provider may also give you more specific instructions. Your treatment has been planned according to current medical practices, but problems sometimes occur. Call your health care provider if you have any problems or questions after your procedure. °WHAT TO EXPECT AFTER THE PROCEDURE °After your procedure, it is typical to have the following: °· Pain in your abdomen, especially along your incision. You will be given pain medicines to control the pain. °· Constipation. You may be given a stool softener to help prevent this. °HOME CARE INSTRUCTIONS  °· Only take over-the-counter or prescription medicines as directed by your health care provider. °· Keep the wound dry and clean. You may wash the wound gently with soap and water 48 hours after surgery. Gently blot or dab the wound dry. Do not take baths, use swimming pools, or use hot tubs for 10 days or until your health care provider approves. °· Change bandages (dressings) as directed by your health care provider. °· Continue your normal diet as directed by your health care provider. Eat plenty of fruits and vegetables to help prevent constipation. °· Drink enough fluids to keep your urine clear or pale yellow. This also helps prevent constipation. °· Do not drive until your health care provider says it is okay. °· Do not lift anything heavier than 10 pounds (4.5 kg) or play contact sports for 4 weeks or until your health care provider approves. °· Follow up with your health care provider as directed. Ask your health care provider when to make an appointment to have your stitches (sutures) or staples removed. °SEEK MEDICAL CARE IF:  °· You have increased bleeding coming from the incision site. °· You have blood in your stool. °· You have increasing pain in the wound. °· You see redness  or swelling in the wound. °· You have fluid (pus) coming from the wound. °· You have a fever. °· You notice a bad smell coming from the wound or dressing. °SEEK IMMEDIATE MEDICAL CARE IF:  °· You develop a rash. °· You have chest pain or shortness of breath. °· You feel lightheaded or feel faint. °Document Released: 07/22/2004 Document Revised: 10/23/2012 Document Reviewed: 08/14/2012 °ExitCare® Patient Information ©2015 [employer]. This information is not intended to replace advice given to you by your health care provider. Make sure you discuss any questions you have with your health care provider. ° °

## 2014-07-15 ENCOUNTER — Other Ambulatory Visit (HOSPITAL_COMMUNITY): Payer: Self-pay | Admitting: Family Medicine

## 2014-07-15 ENCOUNTER — Ambulatory Visit (HOSPITAL_COMMUNITY)
Admission: RE | Admit: 2014-07-15 | Discharge: 2014-07-15 | Disposition: A | Discharge status: Home / Self Care | Payer: Medicare Other | Source: Ambulatory Visit | Attending: Family Medicine | Admitting: Family Medicine

## 2014-07-15 DIAGNOSIS — R200 Anesthesia of skin: Secondary | ICD-10-CM | POA: Insufficient documentation

## 2014-07-15 DIAGNOSIS — M25562 Pain in left knee: Secondary | ICD-10-CM | POA: Diagnosis present

## 2014-07-15 DIAGNOSIS — M25561 Pain in right knee: Secondary | ICD-10-CM

## 2014-07-15 DIAGNOSIS — M25461 Effusion, right knee: Secondary | ICD-10-CM | POA: Diagnosis not present

## 2014-08-25 ENCOUNTER — Encounter: Payer: Self-pay | Admitting: Orthopedic Surgery

## 2014-08-25 ENCOUNTER — Ambulatory Visit: Payer: Medicare Other | Admitting: Orthopedic Surgery

## 2014-08-25 ENCOUNTER — Emergency Department (HOSPITAL_COMMUNITY)
Admission: EM | Admit: 2014-08-25 | Discharge: 2014-08-25 | Disposition: A | Discharge status: Home / Self Care | Payer: Medicare Other | Attending: Emergency Medicine | Admitting: Emergency Medicine

## 2014-08-25 ENCOUNTER — Encounter (HOSPITAL_COMMUNITY): Payer: Self-pay | Admitting: Emergency Medicine

## 2014-08-25 ENCOUNTER — Emergency Department (HOSPITAL_COMMUNITY): Payer: Medicare Other

## 2014-08-25 DIAGNOSIS — I10 Essential (primary) hypertension: Secondary | ICD-10-CM | POA: Insufficient documentation

## 2014-08-25 DIAGNOSIS — K5900 Constipation, unspecified: Secondary | ICD-10-CM

## 2014-08-25 DIAGNOSIS — R011 Cardiac murmur, unspecified: Secondary | ICD-10-CM | POA: Diagnosis not present

## 2014-08-25 DIAGNOSIS — Z7982 Long term (current) use of aspirin: Secondary | ICD-10-CM | POA: Insufficient documentation

## 2014-08-25 DIAGNOSIS — Z79899 Other long term (current) drug therapy: Secondary | ICD-10-CM | POA: Insufficient documentation

## 2014-08-25 MED ORDER — ONDANSETRON 4 MG PO TBDP
4.0000 mg | ORAL_TABLET | Freq: Once | ORAL | Status: AC
  Administered 2014-08-25: 4 mg via ORAL
  Filled 2014-08-25: qty 1

## 2014-08-25 MED ORDER — FLEET ENEMA 7-19 GM/118ML RE ENEM
1.0000 | ENEMA | Freq: Once | RECTAL | Status: AC
  Administered 2014-08-25: 1 via RECTAL

## 2014-08-25 MED ORDER — BISACODYL 10 MG RE SUPP
10.0000 mg | Freq: Once | RECTAL | Status: AC
  Administered 2014-08-25: 10 mg via RECTAL
  Filled 2014-08-25: qty 1

## 2014-08-25 NOTE — ED Provider Notes (Signed)
Patient had a large bowel movement. No acute abdomen. Will discharge.  Donnetta Hutching, MD 08/25/14 [phone]

## 2014-08-25 NOTE — ED Notes (Signed)
Pt has had large soft bowel movement since receiving Fleets enema.

## 2014-08-25 NOTE — ED Provider Notes (Signed)
CSN: [id number]     Arrival date & time 08/25/14  0455 History   First MD Initiated Contact with Patient 08/25/14 [phone]     Chief Complaint  Patient presents with  . Constipation     (Consider location/radiation/quality/duration/timing/severity/associated sxs/prior Treatment) HPI patient states her last bowel movement was about a week ago. She states she had a hard ball and then some loose stool came out. She states she took a laxative  to have that bowel movement which was senna. However she's tried the senna again without results. She denies abdominal pain but states she feels full. She however has had a normal appetite. She has some mild nausea the night without vomiting. She states the constipation is unusual. She has been on narcotic pain medication for a long time.  PCP Dr Janna Arch  Past Medical History  Diagnosis Date  . Hypertension    Past Surgical History  Procedure Laterality Date  . Ventral hernia repair N/A 03/25/2014    Procedure: VENTRAL HERIORRHAPHY WITH MESH;  Surgeon: Franky Macho Md, MD;  Location: [facility];  Service: General;  Laterality: N/A;  . Insertion of mesh  03/25/2014    Procedure: INSERTION OF MESH;  Surgeon: Franky Macho Md, MD;  Location: [facility];  Service: General;;   History reviewed. No pertinent family history. History  Substance Use Topics  . Smoking status: Never Smoker   . Smokeless tobacco: Not on file  . Alcohol Use: No   Lives at home Lives with sister  OB History    No data available     Review of Systems  All other systems reviewed and are negative.     Allergies  Review of patient's allergies indicates no known allergies.  Home Medications   Prior to Admission medications   Medication Sig Start Date End Date Taking? Authorizing Provider  amLODipine (NORVASC) 5 MG tablet Take 5 mg by mouth daily.   Yes Historical Provider, MD  aspirin EC 81 MG tablet Take 81 mg by mouth daily.   Yes Historical Provider, MD  atorvastatin  (LIPITOR) 20 MG tablet Take 20 mg by mouth daily.   Yes Historical Provider, MD  cloNIDine (CATAPRES) 0.1 MG tablet Take 0.1 mg by mouth 2 (two) times daily.   Yes Historical Provider, MD  lisinopril-hydrochlorothiazide (PRINZIDE,ZESTORETIC) 20-12.5 MG per tablet Take 1 tablet by mouth daily.   Yes Historical Provider, MD  oxycodone (OXY-IR) 5 MG capsule Take 5 mg by mouth every 4 (four) hours as needed.   Yes Historical Provider, MD  ferrous sulfate 325 (65 FE) MG tablet Take 325 mg by mouth daily with breakfast.    Historical Provider, MD  HYDROcodone-acetaminophen (NORCO/VICODIN) 5-325 MG per tablet Take 1 tablet by mouth every 4 (four) hours as needed for moderate pain. 03/27/14   Oval Linsey, MD  Multiple Vitamins-Minerals (CVS SPECTRAVITE ADULT 50+) TABS Take 1 tablet by mouth daily.    Historical Provider, MD   BP 172/111 mmHg  Pulse 76  Temp(Src) 98.7 F (37.1 C) (Oral)  Resp 20  Ht  (1.549 m)  Wt 135 lb (61.236 kg)  BMI 25.52 kg/m2  SpO2 100%  Vital signs normal except for hypertension  Physical Exam  Constitutional: She is oriented to person, place, and time. She appears well-developed and well-nourished.  Non-toxic appearance. She does not appear ill. No distress.  HENT:  Head: Normocephalic and atraumatic.  Right Ear: External ear normal.  Left Ear: External ear normal.  Nose: Nose normal. No mucosal  edema or rhinorrhea.  Mouth/Throat: Oropharynx is clear and moist and mucous membranes are normal. No dental abscesses or uvula swelling.  Eyes: Conjunctivae and EOM are normal. Pupils are equal, round, and reactive to light.  Neck: Normal range of motion and full passive range of motion without pain. Neck supple.  Cardiovascular: Normal rate and regular rhythm.  Exam reveals no gallop and no friction rub.   Murmur heard. Patient has a low pitched mid systolic crescendo murmur heard best in the left upper sternal border  Pulmonary/Chest: Effort normal and breath  sounds normal. No respiratory distress. She has no wheezes. She has no rhonchi. She has no rales. She exhibits no tenderness and no crepitus.  Abdominal: Soft. Normal appearance and bowel sounds are normal. She exhibits no distension. There is tenderness. There is no rebound and no guarding.  Has some mild tenderness to palpation without localization or guarding  Musculoskeletal: Normal range of motion. She exhibits edema. She exhibits no tenderness.  Moves all extremities well.   Neurological: She is alert and oriented to person, place, and time. She has normal strength. No cranial nerve deficit.  Skin: Skin is warm, dry and intact. No rash noted. No erythema. No pallor.  Psychiatric: She has a normal mood and affect. Her speech is normal and behavior is normal. Her mood appears not anxious.  Nursing note and vitals reviewed.   ED Course  Procedures (including critical care time)  Medications  sodium phosphate (FLEET) 7-19 GM/118ML enema 1 enema (not administered)  bisacodyl (DULCOLAX) suppository 10 mg (10 mg Rectal Given 08/25/14 0615)  ondansetron (ZOFRAN-ODT) disintegrating tablet 4 mg (4 mg Oral Given 08/25/14 0615)   Patient given Zofran for her nausea and a Dulcolax suppository for her constipation.  735 on recheck patient has had no results with the suppository. Fleets enema was ordered.  8:10 AM patient turned over to Dr. Adriana Simas at change of shift to see if she gets results from the enema.  Labs Review Labs Reviewed - No data to display  Imaging Review Dg Abd Acute W/chest  08/25/2014   CLINICAL DATA:  Acute onset of constipation for 1 week. Abdominal pain and distention. Initial encounter.  EXAM: DG ABDOMEN ACUTE W/ 1V CHEST  COMPARISON:  CT of the abdomen and pelvis, and chest and abdominal radiographs performed 03/20/2014  FINDINGS: The lungs are well-aerated. Mild vascular congestion is noted. There is no evidence of focal opacification, pleural effusion or pneumothorax. The  cardiomediastinal silhouette is borderline enlarged.  The visualized bowel gas pattern is unremarkable. Scattered stool and air are seen within the colon; there is no evidence of small bowel dilatation to suggest obstruction. No free intra-abdominal air is identified on the provided upright view.  No acute osseous abnormalities are seen; the sacroiliac joints are unremarkable in appearance. There is chronic superior subluxation of both humeral heads, with underlying partial resorption of the distal clavicles and acromion bilaterally.  IMPRESSION: 1. Unremarkable bowel gas pattern; no free intra-abdominal air seen. Moderate amount of stool noted in the colon. 2. Mild vascular congestion and borderline cardiomegaly. Lungs remain grossly clear.   Electronically Signed   By: Roanna Raider M.D.   On: 08/25/2014 06:32     EKG Interpretation None      MDM   Final diagnoses:  Constipation, unspecified constipation type    Disposition pending  Devoria Albe, MD, Concha Pyo, MD 08/25/14 [phone]

## 2014-08-25 NOTE — ED Notes (Signed)
   08/25/14 0501  Abdominal  Gastrointestinal (WDL) X  Abdomen Inspection Distended  RUQ Bowel Sounds Audible  LUQ Bowel Sounds Audible  RLQ Bowel Sounds Audible  LLQ Bowel Sounds Audible  Tenderness Guarding  Passing Flatus No

## 2014-08-25 NOTE — ED Notes (Signed)
Pt c/o constipation x one week.

## 2014-08-25 NOTE — Discharge Instructions (Signed)
Constipation Constipation is when a person:  Poops (has a bowel movement) less than 3 times a week.  Has a hard time pooping.  Has poop that is dry, hard, or bigger than normal. HOME CARE   Eat foods with a lot of fiber in them. This includes fruits, vegetables, beans, and whole grains such as brown rice.  Avoid fatty foods and foods with a lot of sugar. This includes french fries, hamburgers, cookies, candy, and soda.  If you are not getting enough fiber from food, take products with added fiber in them (supplements).  Drink enough fluid to keep your pee (urine) clear or pale yellow.  Exercise on a regular basis, or as told by your doctor.  Go to the restroom when you feel like you need to poop. Do not hold it.  Only take medicine as told by your doctor. Do not take medicines that help you poop (laxatives) without talking to your doctor first. GET HELP RIGHT AWAY IF:   You have bright red blood in your poop (stool).  Your constipation lasts more than 4 days or gets worse.  You have belly (abdominal) or butt (rectal) pain.  You have thin poop (as thin as a pencil).  You lose weight, and it cannot be explained. MAKE SURE YOU:   Understand these instructions.  Will watch your condition.  Will get help right away if you are not doing well or get worse. Document Released: 06/21/2007 Document Revised: 01/07/2013 Document Reviewed: 10/14/2012 [facility] Patient Information 2015 [place], [place]. This information is not intended to replace advice given to you by your health care provider. Make sure you discuss any questions you have with your health care provider.   Increase fluid, fruit, fiber. Consider prune juice. Other over-the-counter products include Dulcolax, milk of magnesia, Miralax, magnesium citrate.  Follow-up your doctor.

## 2014-09-06 ENCOUNTER — Observation Stay (HOSPITAL_COMMUNITY)
Admission: EM | Admit: 2014-09-06 | Discharge: 2014-09-10 | Disposition: A | Discharge status: Skilled Nursing Facility | Payer: Medicare Other | Attending: Internal Medicine | Admitting: Internal Medicine

## 2014-09-06 ENCOUNTER — Emergency Department (HOSPITAL_COMMUNITY): Payer: Medicare Other

## 2014-09-06 ENCOUNTER — Encounter (HOSPITAL_COMMUNITY): Payer: Self-pay | Admitting: Emergency Medicine

## 2014-09-06 DIAGNOSIS — K5900 Constipation, unspecified: Secondary | ICD-10-CM | POA: Diagnosis not present

## 2014-09-06 DIAGNOSIS — N289 Disorder of kidney and ureter, unspecified: Secondary | ICD-10-CM | POA: Diagnosis present

## 2014-09-06 DIAGNOSIS — D649 Anemia, unspecified: Secondary | ICD-10-CM | POA: Diagnosis not present

## 2014-09-06 DIAGNOSIS — R1310 Dysphagia, unspecified: Secondary | ICD-10-CM | POA: Diagnosis not present

## 2014-09-06 DIAGNOSIS — R531 Weakness: Secondary | ICD-10-CM | POA: Diagnosis not present

## 2014-09-06 DIAGNOSIS — D6489 Other specified anemias: Secondary | ICD-10-CM | POA: Insufficient documentation

## 2014-09-06 DIAGNOSIS — R748 Abnormal levels of other serum enzymes: Secondary | ICD-10-CM | POA: Insufficient documentation

## 2014-09-06 DIAGNOSIS — I517 Cardiomegaly: Secondary | ICD-10-CM | POA: Insufficient documentation

## 2014-09-06 DIAGNOSIS — D631 Anemia in chronic kidney disease: Secondary | ICD-10-CM | POA: Diagnosis not present

## 2014-09-06 DIAGNOSIS — R0600 Dyspnea, unspecified: Secondary | ICD-10-CM

## 2014-09-06 DIAGNOSIS — Z7982 Long term (current) use of aspirin: Secondary | ICD-10-CM | POA: Insufficient documentation

## 2014-09-06 DIAGNOSIS — N183 Chronic kidney disease, stage 3 (moderate): Secondary | ICD-10-CM | POA: Insufficient documentation

## 2014-09-06 DIAGNOSIS — I071 Rheumatic tricuspid insufficiency: Secondary | ICD-10-CM | POA: Insufficient documentation

## 2014-09-06 DIAGNOSIS — R4702 Dysphasia: Secondary | ICD-10-CM | POA: Insufficient documentation

## 2014-09-06 DIAGNOSIS — R778 Other specified abnormalities of plasma proteins: Secondary | ICD-10-CM | POA: Diagnosis present

## 2014-09-06 DIAGNOSIS — R0602 Shortness of breath: Secondary | ICD-10-CM | POA: Insufficient documentation

## 2014-09-06 DIAGNOSIS — K222 Esophageal obstruction: Principal | ICD-10-CM | POA: Insufficient documentation

## 2014-09-06 DIAGNOSIS — R7989 Other specified abnormal findings of blood chemistry: Secondary | ICD-10-CM | POA: Diagnosis present

## 2014-09-06 DIAGNOSIS — K224 Dyskinesia of esophagus: Secondary | ICD-10-CM | POA: Insufficient documentation

## 2014-09-06 DIAGNOSIS — I129 Hypertensive chronic kidney disease with stage 1 through stage 4 chronic kidney disease, or unspecified chronic kidney disease: Secondary | ICD-10-CM | POA: Diagnosis not present

## 2014-09-06 DIAGNOSIS — N179 Acute kidney failure, unspecified: Secondary | ICD-10-CM | POA: Diagnosis present

## 2014-09-06 DIAGNOSIS — I10 Essential (primary) hypertension: Secondary | ICD-10-CM | POA: Diagnosis present

## 2014-09-06 DIAGNOSIS — Z8673 Personal history of transient ischemic attack (TIA), and cerebral infarction without residual deficits: Secondary | ICD-10-CM | POA: Diagnosis not present

## 2014-09-06 DIAGNOSIS — I440 Atrioventricular block, first degree: Secondary | ICD-10-CM | POA: Insufficient documentation

## 2014-09-06 DIAGNOSIS — R0789 Other chest pain: Secondary | ICD-10-CM | POA: Diagnosis not present

## 2014-09-06 DIAGNOSIS — M542 Cervicalgia: Secondary | ICD-10-CM

## 2014-09-06 HISTORY — DX: Reserved for inherently not codable concepts without codable children: IMO0001

## 2014-09-06 LAB — CBC WITH DIFFERENTIAL/PLATELET
BASOS ABS: 0 10*3/uL (ref 0.0–0.1)
BASOS PCT: 0 % (ref 0–1)
Eosinophils Absolute: 0.2 10*3/uL (ref 0.0–0.7)
Eosinophils Relative: 3 % (ref 0–5)
HEMATOCRIT: 24.4 % — AB (ref 36.0–46.0)
HEMOGLOBIN: 8.1 g/dL — AB (ref 12.0–15.0)
LYMPHS PCT: 38 % (ref 12–46)
Lymphs Abs: 1.9 10*3/uL (ref 0.7–4.0)
MCH: 33.3 pg (ref 26.0–34.0)
MCHC: 33.2 g/dL (ref 30.0–36.0)
MCV: 100.4 fL — AB (ref 78.0–100.0)
MONO ABS: 0.3 10*3/uL (ref 0.1–1.0)
Monocytes Relative: 7 % (ref 3–12)
NEUTROS ABS: 2.6 10*3/uL (ref 1.7–7.7)
NEUTROS PCT: 52 % (ref 43–77)
Platelets: 241 10*3/uL (ref 150–400)
RBC: 2.43 MIL/uL — AB (ref 3.87–5.11)
RDW: 12.9 % (ref 11.5–15.5)
WBC: 5 10*3/uL (ref 4.0–10.5)

## 2014-09-06 LAB — FOLATE: FOLATE: 29.5 ng/mL (ref >5.9)

## 2014-09-06 LAB — BASIC METABOLIC PANEL
ANION GAP: 6 (ref 5–15)
BUN: 47 mg/dL — ABNORMAL HIGH (ref 6–20)
CALCIUM: 8.4 mg/dL — AB (ref 8.9–10.3)
CO2: 26 mmol/L (ref 22–32)
Chloride: 109 mmol/L (ref 101–111)
Creatinine, Ser: 1.8 mg/dL — ABNORMAL HIGH (ref 0.44–1.00)
GFR, EST AFRICAN AMERICAN: 29 mL/min — AB (ref >60)
GFR, EST NON AFRICAN AMERICAN: 25 mL/min — AB (ref >60)
Glucose, Bld: 98 mg/dL (ref 65–99)
POTASSIUM: 4.9 mmol/L (ref 3.5–5.1)
Sodium: 141 mmol/L (ref 135–145)

## 2014-09-06 LAB — IRON AND TIBC
Iron: 38 ug/dL (ref 28–170)
Saturation Ratios: 18 % (ref 10.4–31.8)
TIBC: 207 ug/dL — AB (ref 250–450)
UIBC: 169 ug/dL

## 2014-09-06 LAB — URINALYSIS, ROUTINE W REFLEX MICROSCOPIC
BILIRUBIN URINE: NEGATIVE
Glucose, UA: NEGATIVE mg/dL
Ketones, ur: NEGATIVE mg/dL
Leukocytes, UA: NEGATIVE
NITRITE: NEGATIVE
Specific Gravity, Urine: >1.03 — ABNORMAL HIGH (ref 1.005–1.030)
UROBILINOGEN UA: 0.2 mg/dL (ref 0.0–1.0)
pH: 6 (ref 5.0–8.0)

## 2014-09-06 LAB — TSH: TSH: 2.191 u[IU]/mL (ref 0.350–4.500)

## 2014-09-06 LAB — TROPONIN I
TROPONIN I: 0.19 ng/mL — AB (ref <0.031)
TROPONIN I: 0.18 ng/mL — AB (ref <0.031)
Troponin I: 0.19 ng/mL — ABNORMAL HIGH (ref <0.031)

## 2014-09-06 LAB — URINE MICROSCOPIC-ADD ON

## 2014-09-06 LAB — RETICULOCYTES
RBC.: 2.57 MIL/uL — AB (ref 3.87–5.11)
RETIC CT PCT: 2.4 % (ref 0.4–3.1)
Retic Count, Absolute: 61.7 10*3/uL (ref 19.0–186.0)

## 2014-09-06 LAB — VITAMIN B12: VITAMIN B 12: 709 pg/mL (ref 180–914)

## 2014-09-06 LAB — FERRITIN: Ferritin: 178 ng/mL (ref 11–307)

## 2014-09-06 LAB — GLUCOSE, CAPILLARY: Glucose-Capillary: 90 mg/dL (ref 65–99)

## 2014-09-06 LAB — BRAIN NATRIURETIC PEPTIDE: B NATRIURETIC PEPTIDE 5: 327 pg/mL — AB (ref 0.0–100.0)

## 2014-09-06 MED ORDER — ACETAMINOPHEN 650 MG RE SUPP: 650.0000 mg | Freq: Four times a day (QID) | RECTAL | Status: DC | PRN

## 2014-09-06 MED ORDER — HEPARIN SODIUM (PORCINE) 5000 UNIT/ML IJ SOLN
5000.0000 [IU] | Freq: Three times a day (TID) | INTRAMUSCULAR | Status: DC
  Administered 2014-09-06 – 2014-09-10 (×13): 5000 [IU] via SUBCUTANEOUS
  Filled 2014-09-06 (×16): qty 1

## 2014-09-06 MED ORDER — ATORVASTATIN CALCIUM 20 MG PO TABS
20.0000 mg | ORAL_TABLET | Freq: Every day | ORAL | Status: DC
  Administered 2014-09-06 – 2014-09-10 (×5): 20 mg via ORAL
  Filled 2014-09-06 (×8): qty 1

## 2014-09-06 MED ORDER — CLONIDINE HCL 0.1 MG PO TABS
0.1000 mg | ORAL_TABLET | Freq: Two times a day (BID) | ORAL | Status: DC
  Administered 2014-09-06: 0.1 mg via ORAL
  Filled 2014-09-06: qty 1

## 2014-09-06 MED ORDER — ONDANSETRON HCL 4 MG PO TABS
4.0000 mg | ORAL_TABLET | Freq: Four times a day (QID) | ORAL | Status: DC | PRN
Start: 2014-09-06 — End: 2014-09-10

## 2014-09-06 MED ORDER — DEXTROSE-NACL 5-0.9 % IV SOLN
INTRAVENOUS | Status: DC
  Administered 2014-09-06: 14:00:00 via INTRAVENOUS

## 2014-09-06 MED ORDER — ADULT MULTIVITAMIN W/MINERALS CH
1.0000 | ORAL_TABLET | Freq: Every day | ORAL | Status: DC
  Administered 2014-09-07 – 2014-09-10 (×4): 1 via ORAL
  Filled 2014-09-06 (×6): qty 1

## 2014-09-06 MED ORDER — AMLODIPINE BESYLATE 5 MG PO TABS
5.0000 mg | ORAL_TABLET | Freq: Every day | ORAL | Status: DC
  Administered 2014-09-06 – 2014-09-10 (×5): 5 mg via ORAL
  Filled 2014-09-06 (×6): qty 1

## 2014-09-06 MED ORDER — ONDANSETRON HCL 4 MG/2ML IJ SOLN: 4.0000 mg | Freq: Four times a day (QID) | INTRAMUSCULAR | Status: DC | PRN

## 2014-09-06 MED ORDER — DEXTROSE-NACL 5-0.9 % IV SOLN
INTRAVENOUS | Status: DC
  Administered 2014-09-06: 20:00:00 via INTRAVENOUS

## 2014-09-06 MED ORDER — SODIUM CHLORIDE 0.9 % IV SOLN: INTRAVENOUS | Status: DC

## 2014-09-06 MED ORDER — ASPIRIN EC 81 MG PO TBEC
81.0000 mg | DELAYED_RELEASE_TABLET | Freq: Every day | ORAL | Status: DC
  Administered 2014-09-07 – 2014-09-10 (×4): 81 mg via ORAL
  Filled 2014-09-06 (×6): qty 1

## 2014-09-06 MED ORDER — NITROGLYCERIN 2 % TD OINT
0.5000 [in_us] | TOPICAL_OINTMENT | Freq: Four times a day (QID) | TRANSDERMAL | Status: DC
  Administered 2014-09-06 – 2014-09-10 (×15): 0.5 [in_us] via TOPICAL
  Filled 2014-09-06: qty 30

## 2014-09-06 MED ORDER — MORPHINE SULFATE (PF) 2 MG/ML IV SOLN
2.0000 mg | INTRAVENOUS | Status: DC | PRN
Start: 2014-09-06 — End: 2014-09-10

## 2014-09-06 MED ORDER — ACETAMINOPHEN 325 MG PO TABS: 650.0000 mg | ORAL_TABLET | Freq: Four times a day (QID) | ORAL | Status: DC | PRN

## 2014-09-06 MED ORDER — HYDRALAZINE HCL 50 MG PO TABS
50.0000 mg | ORAL_TABLET | Freq: Three times a day (TID) | ORAL | Status: DC
  Administered 2014-09-06 – 2014-09-10 (×12): 50 mg via ORAL
  Filled 2014-09-06 (×18): qty 1

## 2014-09-06 MED ORDER — ASPIRIN 325 MG PO TABS
325.0000 mg | ORAL_TABLET | Freq: Once | ORAL | Status: AC
  Administered 2014-09-06: 325 mg via ORAL
  Filled 2014-09-06: qty 1

## 2014-09-06 MED ORDER — SENNA 8.6 MG PO TABS
1.0000 | ORAL_TABLET | Freq: Every day | ORAL | Status: DC
  Administered 2014-09-06 – 2014-09-10 (×5): 8.6 mg via ORAL
  Filled 2014-09-06 (×7): qty 1

## 2014-09-06 MED ORDER — SODIUM CHLORIDE 0.9 % IJ SOLN
3.0000 mL | Freq: Two times a day (BID) | INTRAMUSCULAR | Status: DC
  Administered 2014-09-06 – 2014-09-10 (×9): 3 mL via INTRAVENOUS

## 2014-09-06 MED ORDER — MORPHINE SULFATE (PF) 2 MG/ML IV SOLN
2.0000 mg | Freq: Once | INTRAVENOUS | Status: AC
  Administered 2014-09-06: 2 mg via INTRAVENOUS
  Filled 2014-09-06: qty 1

## 2014-09-06 NOTE — Progress Notes (Signed)
Patient arrived to 70E[facility] from [facility]. Alert and hemodynamically stable. I discussed clinical findings with Dr. Nishan/cardiologist who suspects current chest discomfort symptoms not typical for cardiac ischemia and agrees with proceeding initially with echocardiogram. He also agrees with pursuing esophagram since symptoms seem more typical with GI/dysphagia, odynophagia etiology. Suspect subtle stable troponin elevation more consistent with patient's underlying stage IV chronic kidney disease. In further discussion with but the patient and her daughter patient has had poor oral intake including fluids for several months and has continued to take off her medications including her diuretics. She does report symptoms consistent with some difficulty swallowing. We have agreed to advance diet but only to full liquids. We will continue IV fluids. TSH is normal. She is currently not experiencing any type of chest discomfort.  Junious Silk, ANP

## 2014-09-06 NOTE — ED Provider Notes (Signed)
CSN: [id number]     Arrival date & time 09/06/14  0109 History   First MD Initiated Contact with Patient 09/06/14 0114     Chief Complaint  Patient presents with  . Shortness of Breath  . Extremity Weakness      HPI Patient presents with an abnormal sensation in her anterior neck and throat.  She states this began prior to arrival.  It made her feel slightly short of breath.  She states she's never had this sensation before.  She does not describe it as a pressure pain.  The abnormal sensation does not radiate.  She feels that times her voice seems abnormal to her.  No recent injury or trauma.  No productive cough.  No history of heart disease.  She has a history of hypertension and is compliant with her medications.  She denies anterior chest pain back pain.  There is no radiation of the abnormal sensation into her jaw or into her arms.  Her shortness of breath is described as mild.  She has no other new complaints.  She does report some chronic right leg numbness which her family reports is baseline for her.   Past Medical History  Diagnosis Date  . Hypertension    Past Surgical History  Procedure Laterality Date  . Ventral hernia repair N/A 03/25/2014    Procedure: VENTRAL HERIORRHAPHY WITH MESH;  Surgeon: Franky Macho Md, MD;  Location: [facility];  Service: General;  Laterality: N/A;  . Insertion of mesh  03/25/2014    Procedure: INSERTION OF MESH;  Surgeon: Franky Macho Md, MD;  Location: [facility];  Service: General;;   History reviewed. No pertinent family history. Social History  Substance Use Topics  . Smoking status: Never Smoker   . Smokeless tobacco: None  . Alcohol Use: No   OB History    No data available     Review of Systems  All other systems reviewed and are negative.     Allergies  Review of patient's allergies indicates no known allergies.  Home Medications   Prior to Admission medications   Medication Sig Start Date End Date Taking? Authorizing Provider   amLODipine (NORVASC) 5 MG tablet Take 5 mg by mouth daily.    Historical Provider, MD  aspirin EC 81 MG tablet Take 81 mg by mouth daily.    Historical Provider, MD  atorvastatin (LIPITOR) 20 MG tablet Take 20 mg by mouth daily.    Historical Provider, MD  cloNIDine (CATAPRES) 0.1 MG tablet Take 0.1 mg by mouth 2 (two) times daily.    Historical Provider, MD  ferrous sulfate 325 (65 FE) MG tablet Take 325 mg by mouth daily with breakfast.    Historical Provider, MD  HYDROcodone-acetaminophen (NORCO/VICODIN) 5-325 MG per tablet Take 1 tablet by mouth every 4 (four) hours as needed for moderate pain. 03/27/14   Oval Linsey, MD  lisinopril-hydrochlorothiazide (PRINZIDE,ZESTORETIC) 20-12.5 MG per tablet Take 1 tablet by mouth daily.    Historical Provider, MD  Multiple Vitamins-Minerals ([employer] SPECTRAVITE ADULT 50+) TABS Take 1 tablet by mouth daily.    Historical Provider, MD  Multiple Vitamins-Minerals (MULTIVITAMIN WITH MINERALS) tablet Take 1 tablet by mouth daily.    Historical Provider, MD  naproxen (NAPROSYN) 500 MG tablet Take 500 mg by mouth 2 (two) times daily with a meal.    Historical Provider, MD  oxycodone (OXY-IR) 5 MG capsule Take 5 mg by mouth every 4 (four) hours as needed.    Historical Provider, MD  senna (SENOKOT) 8.6 MG tablet Take 1 tablet by mouth daily.    Historical Provider, MD   BP 140/52 mmHg  Pulse 49  Temp(Src) 98.4 F (36.9 C) (Oral)  Resp 15  Ht 5\' 4"  (1.626 m)  SpO2 90% Physical Exam  Constitutional: She is oriented to person, place, and time. She appears well-developed and well-nourished. No distress.  HENT:  Head: Normocephalic and atraumatic.  Eyes: EOM are normal.  Neck: Normal range of motion. Neck supple. No tracheal deviation present. No thyromegaly present.  Cardiovascular: Normal rate, regular rhythm and normal heart sounds.   Pulmonary/Chest: Effort normal and breath sounds normal. No stridor.  Abdominal: Soft. She exhibits no distension. There  is no tenderness.  Musculoskeletal: Normal range of motion.  Lymphadenopathy:    She has no cervical adenopathy.  Neurological: She is alert and oriented to person, place, and time.  Skin: Skin is warm and dry.  Psychiatric: She has a normal mood and affect. Judgment normal.  Nursing note and vitals reviewed.   ED Course  Procedures (including critical care time) Labs Review Labs Reviewed  CBC WITH DIFFERENTIAL/PLATELET - Abnormal; Notable for the following:    RBC 2.43 (*)    Hemoglobin 8.1 (*)    HCT 24.4 (*)    MCV 100.4 (*)    All other components within normal limits  BASIC METABOLIC PANEL - Abnormal; Notable for the following:    BUN 47 (*)    Creatinine, Ser 1.80 (*)    Calcium 8.4 (*)    GFR calc non Af Amer 25 (*)    GFR calc Af Amer 29 (*)    All other components within normal limits  TROPONIN I - Abnormal; Notable for the following:    Troponin I 0.19 (*)    All other components within normal limits  BRAIN NATRIURETIC PEPTIDE - Abnormal; Notable for the following:    B Natriuretic Peptide 327.0 (*)    All other components within normal limits  RETICULOCYTES - Abnormal; Notable for the following:    RBC. 2.57 (*)    All other components within normal limits  URINALYSIS, ROUTINE W REFLEX MICROSCOPIC (NOT AT [facility]) - Abnormal; Notable for the following:    Specific Gravity, Urine >1.030 (*)    Hgb urine dipstick MODERATE (*)    Protein, ur >300 (*)    All other components within normal limits  TROPONIN I - Abnormal; Notable for the following:    Troponin I 0.19 (*)    All other components within normal limits  URINE MICROSCOPIC-ADD ON - Abnormal; Notable for the following:    Squamous Epithelial / LPF MANY (*)    Bacteria, UA MANY (*)    All other components within normal limits  VITAMIN B12  FOLATE  IRON AND TIBC  FERRITIN    Imaging Review Dg Chest 2 View  09/06/2014   CLINICAL DATA:  Chest pain and shortness of breath  EXAM: CHEST  2 VIEW  COMPARISON:   08/25/2014  FINDINGS: Chronic cardiomegaly.  Negative aortic and hilar contours.  Trace right pleural effusion. No pneumonia or edema. No pneumothorax.  Severe bilateral glenohumeral osteoarthritis with chronic rotator cuff tears and AC joint destruction.  IMPRESSION: Chronic cardiomegaly and trace right pleural effusion.   Electronically Signed   By: Marnee Spring M.D.   On: 09/06/2014 02:29   I have personally reviewed and evaluated these images and lab results as part of my medical decision-making.   EKG Interpretation  Date/Time:  Sunday September 06 2014 01:29:41 EDT Ventricular Rate:  61 PR Interval:  204 QRS Duration: 85 QT Interval:  426 QTC Calculation: 429 R Axis:   -7 Text Interpretation:  Sinus rhythm Left ventricular hypertrophy No  significant change was found Confirmed by Araf Clugston  MD, Camila Maita ([id number]) on  09/06/2014 1:45:42 AM      MDM   Final diagnoses:  None   Patient unable to describe the fullness sensation in her throat any further.  Is not a pain or pressure.  There is no radiation.  She reports mild shortness of breath.  EKG is without ischemic changes.  EKG is consistent with LVH.  She has a mild elevation in her troponin of 0.19 which could be supply and demand.  Repeat nearly 3 hours later is stable at 0.19.  This could be supply and demand versus baseline for her given her baseline renal insufficiency.  She does appear to have worsening anemia which is to be worked up. Anemia panel sent   5:35 AM Spoke with cardiologist Dr Sherren Kerns at [facility] who agrees with transfer, hospitalist admission and cardiology consultation on arrival to [place]. Agrees with Aspirin. Believes we can hold off on heparin or lovenox at this time. Will admit to medicine and plan on transfer to [facility] on the hospitalist service    Azalia Bilis, MD 09/06/14 [phone]

## 2014-09-06 NOTE — ED Notes (Signed)
Departure condition and care hand off entered on the wrong patient

## 2014-09-06 NOTE — ED Notes (Signed)
Denies any pain.  Was able to take blood pressure, cholesterol pills without difficulty.  Wanted to take a stool softener and states she feels like it got "hung up" in her throat.

## 2014-09-06 NOTE — H&P (Signed)
[facility] History and Physical  ASAL TEAS FAO:[id number] DOB: [birth year]    PCP:   [name], MD   Chief Complaint: neck and chest tightness.   HPI: Joyce Carpenter is an 81 y.o. female with hx of HTN, mesh ventral heniorrhaphy, lives at home with her niece, presented to ER with vague complaints.  She said she felt pressure with swallowing for "quite a while", feeling tightness on the chest, mild SOB, no nausea or vomiting, and no diaphoresis.  She wasn't sure if it is with exertion. She has had no GERD symptomology, and never had esophageal dilatation.  Evaluation in the ER included an EKG showing no acute changes, but her troponin was in the intermediate range of 0.19, with creatinine of 1.8, above her baseline cr of 1.4 five months prior.  She was also found to have macrocytic anemia with Hb of 8.1 g per dL. With no evidence of bleeding.  She was given morphine, and the EDP physician spoke with cardiology Dr Leo Grosser, who recommended that she be transferred to [facility] for cardiology consultation.  Hospitalist was asked to admit her for further work up.   Rewiew of Systems:  Constitutional: Negative for malaise, fever and chills. No significant weight loss or weight gain Eyes: Negative for eye pain, redness and discharge, diplopia, visual changes, or flashes of light. ENMT: Negative for ear pain, hoarseness, nasal congestion, sinus pressure and sore throat. No headaches; tinnitus, drooling, or problem swallowing. Cardiovascular: Negative for  palpitations, diaphoresis, dyspnea and peripheral edema. ; No orthopnea, PND Respiratory: Negative for cough, hemoptysis, wheezing and stridor. No pleuritic chestpain. Gastrointestinal: Negative for nausea, vomiting, diarrhea, constipation, abdominal pain, melena, blood in stool, hematemesis, jaundice and rectal bleeding.    Genitourinary: Negative for frequency, dysuria, incontinence,flank pain and hematuria; Musculoskeletal:  Negative for back pain and neck pain. Negative for swelling and trauma.;  Skin: . Negative for pruritus, rash, abrasions, bruising and skin lesion.; ulcerations Neuro: Negative for headache, lightheadedness and neck stiffness. Negative for weakness, altered level of consciousness , altered mental status, extremity weakness, burning feet, involuntary movement, seizure and syncope.  Psych: negative for anxiety, depression, insomnia, tearfulness, panic attacks, hallucinations, paranoia, suicidal or homicidal ideation    Past Medical History  Diagnosis Date  . Hypertension     Past Surgical History  Procedure Laterality Date  . Ventral hernia repair N/A 03/25/2014    Procedure: VENTRAL HERIORRHAPHY WITH MESH;  Surgeon: Franky Macho Md, MD;  Location: [facility];  Service: General;  Laterality: N/A;  . Insertion of mesh  03/25/2014    Procedure: INSERTION OF MESH;  Surgeon: Franky Macho Md, MD;  Location: [facility];  Service: General;;    Medications:  HOME MEDS: Prior to Admission medications   Medication Sig Start Date End Date Taking? Authorizing Provider  amLODipine (NORVASC) 5 MG tablet Take 5 mg by mouth daily.    Historical Provider, MD  aspirin EC 81 MG tablet Take 81 mg by mouth daily.    Historical Provider, MD  atorvastatin (LIPITOR) 20 MG tablet Take 20 mg by mouth daily.    Historical Provider, MD  cloNIDine (CATAPRES) 0.1 MG tablet Take 0.1 mg by mouth 2 (two) times daily.    Historical Provider, MD  ferrous sulfate 325 (65 FE) MG tablet Take 325 mg by mouth daily with breakfast.    Historical Provider, MD  HYDROcodone-acetaminophen (NORCO/VICODIN) 5-325 MG per tablet Take 1 tablet by mouth every 4 (four) hours as needed for moderate pain. 03/27/14  Oval Linsey, MD  lisinopril-hydrochlorothiazide (PRINZIDE,ZESTORETIC) 20-12.5 MG per tablet Take 1 tablet by mouth daily.    Historical Provider, MD  Multiple Vitamins-Minerals ([employer]S SPECTRAVITE ADULT 50+) TABS Take 1 tablet by mouth  daily.    Historical Provider, MD  Multiple Vitamins-Minerals (MULTIVITAMIN WITH MINERALS) tablet Take 1 tablet by mouth daily.    Historical Provider, MD  naproxen (NAPROSYN) 500 MG tablet Take 500 mg by mouth 2 (two) times daily with a meal.    Historical Provider, MD  oxycodone (OXY-IR) 5 MG capsule Take 5 mg by mouth every 4 (four) hours as needed.    Historical Provider, MD  senna (SENOKOT) 8.6 MG tablet Take 1 tablet by mouth daily.    Historical Provider, MD     Allergies:  No Known Allergies  Social History:   reports that she has never smoked. She does not have any smokeless tobacco history on file. She reports that she does not drink alcohol or use illicit drugs.  Family History: History reviewed. No pertinent family history.   Physical Exam: Filed Vitals:   09/06/14 0530 09/06/14 0532 09/06/14 0600 09/06/14 0607  BP: 137/56 144/70 158/67 158/67  Pulse:  49  56  Temp:    97.7 F (36.5 C)  TempSrc:    Oral  Resp: Height:      SpO2:  96%  100%   Blood pressure 158/67, pulse 56, temperature 97.7 F (36.5 C), temperature source Oral, resp. rate 16, height  (1.626 m), SpO2 100 %.  GEN:  Pleasant  patient lying in the stretcher in no acute distress; cooperative with exam. PSYCH:  alert and oriented x4; does not appear anxious or depressed; affect is appropriate. HEENT: Mucous membranes pink and anicteric; PERRLA; EOM intact; no cervical lymphadenopathy nor thyromegaly or carotid bruit; no JVD; There were no stridor. Neck is very supple. Breasts:: Not examined CHEST WALL: No tenderness CHEST: Normal respiration, clear to auscultation bilaterally.  HEART: Regular rate and rhythm.  There is a solf 2/6 SEM at the LSB, with no rub, or gallops.   BACK: No kyphosis or scoliosis; no CVA tenderness ABDOMEN: soft and non-tender; no masses, no organomegaly, normal abdominal bowel sounds; no pannus; no intertriginous candida. There is no rebound and no  distention. Rectal Exam: Not done EXTREMITIES: No bone or joint deformity; age-appropriate arthropathy of the hands and knees; no edema; no ulcerations.  There is no calf tenderness. Genitalia: not examined PULSES: 2+ and symmetric SKIN: Normal hydration no rash or ulceration CNS: Cranial nerves 2-12 grossly intact no focal lateralizing neurologic deficit.  Speech is fluent; uvula elevated with phonation, facial symmetry and tongue midline. DTR are normal bilaterally, cerebella exam is intact, barbinski is negative and strengths are equaled bilaterally.  No sensory loss.   Labs on Admission:  Basic Metabolic Panel:  Recent Labs Lab 09/06/14 0212  NA 141  K 4.9  CL 109  CO2 26  GLUCOSE 98  BUN 47*  CREATININE 1.80*  CALCIUM 8.4*   CBC:  Recent Labs Lab 09/06/14 0212  WBC 5.0  NEUTROABS 2.6  HGB 8.1*  HCT 24.4*  MCV 100.4*  PLT 241   Cardiac Enzymes:  Recent Labs Lab 09/06/14 0212 09/06/14 0434  TROPONINI 0.19* 0.19*    CBG: No results for input(s): GLUCAP in the last 168 hours.   Radiological Exams on Admission: Dg Chest 2 View  09/06/2014   CLINICAL DATA:  Chest pain and shortness of breath  EXAM:  CHEST  2 VIEW  COMPARISON:  08/25/2014  FINDINGS: Chronic cardiomegaly.  Negative aortic and hilar contours.  Trace right pleural effusion. No pneumonia or edema. No pneumothorax.  Severe bilateral glenohumeral osteoarthritis with chronic rotator cuff tears and AC joint destruction.  IMPRESSION: Chronic cardiomegaly and trace right pleural effusion.   Electronically Signed   By: Marnee Spring M.D.   On: 09/06/2014 02:29    EKG: Independently reviewed.    Assessment/Plan Present on Admission:  . Elevated troponin . Essential hypertension . Hypertension  PLAN: Will transfer her to the [facility] telemetry unit for cardiology consultation.  Please call cardiology service again in the am. She does have cardiac risk factors, elevated troponins, and could have anginal  equivalent symptomology.  In addition, she could have a demand ischemia, with her low Hb.  Having said that, I think it is more likely that her elevated troponins were due to her CKD and she is having AKI on CKD.  She is not a great historian in term of her symptoms, but I get a sense that she was complaining of dysphagia, and it is possible she has an esophageal stricture, or even a thoracic goiter causing her symptoms,  which can be further evaluated outpatient.  Will admit her to [facility], hold her zesteretic and follow her Cr, start ASA and continue with her statin, obtain ECHO and continue to cycle her troponins, check TSH, check anemia work up panel.  She is stable, full code, and will be admitted to [facility] service.  Thank you for allowing me to participate in here care.    Other plans as per orders.  Code Status: FULL Unk Lightning, MD. [facility] Pager [phone] 7pm to 7am.  09/06/2014, 6:11 AM

## 2014-09-06 NOTE — Consult Note (Signed)
CARDIOLOGY CONSULT NOTE       Patient ID: Joyce Carpenter MRN: [id number] DOB/AGE: 04-29-1930 81 y.o.  Admit date: 09/06/2014 Referring Physician:  Conley Rolls Primary Physician: [name], MD Primary Cardiologist:  Will be [facility] [place] Reason for Consultation:  Elevated troponin  Principal Problem:   Dysphagia Active Problems:   Hypertension   Acute renal failure   Essential hypertension   Elevated troponin   HPI:   81 y.o. female with hx of HTN, mesh ventral heniorrhaphy, lives at home with her niece, presented to ER with vague complaints. She said she felt pressure with swallowing for "quite a while", feeling tightness on the chest, mild SOB, no nausea or vomiting, and no diaphoresis. She wasn't sure if it is with exertion. She has had no GERD symptomology, and never had esophageal dilatation. Evaluation in the ER included an EKG showing no acute changes, but her troponin was in the intermediate range of 0.19, with creatinine of 1.8, above her baseline cr of 1.4 five months prior. She was also found to have macrocytic anemia with Hb of 8.1 g per dL. With no evidence of bleeding. She was given morphine, IM transferred to [facility]  She is a very poor historian  Seems to focus on hurting in epiglottis area and dysphagia and not chest pain.  Sees [facility] as primary She has CRF with Cr around 1.8 and significant anemia with Hct 24  Denies melena  Indices Macrocytic    ROS All other systems reviewed and negative except as noted above  Past Medical History  Diagnosis Date  . Hypertension     History reviewed. No pertinent family history.  Social History   Social History  . Marital Status: Widowed    Spouse Name: N/A  . Number of Children: N/A  . Years of Education: N/A   Occupational History  . Not on file.   Social History Main Topics  . Smoking status: Never Smoker   . Smokeless tobacco: Not on file  . Alcohol Use: No  . Drug Use: No  . Sexual Activity:  Not Currently   Other Topics Concern  . Not on file   Social History Narrative    Past Surgical History  Procedure Laterality Date  . Ventral hernia repair N/A 03/25/2014    Procedure: VENTRAL HERIORRHAPHY WITH MESH;  Surgeon: Franky Macho Md, MD;  Location: [facility];  Service: General;  Laterality: N/A;  . Insertion of mesh  03/25/2014    Procedure: INSERTION OF MESH;  Surgeon: Franky Macho Md, MD;  Location: [facility];  Service: General;;     . amLODipine  5 mg Oral Daily  . aspirin EC  81 mg Oral Daily  . atorvastatin  20 mg Oral Daily  . heparin  5,000 Units Subcutaneous 3 times per day  . hydrALAZINE  50 mg Oral 3 times per day  . multivitamin with minerals  1 tablet Oral Daily  . nitroGLYCERIN  0.5 inch Topical 4 times per day  . senna  1 tablet Oral Daily  . sodium chloride  3 mL Intravenous Q12H   . sodium chloride      Physical Exam: Blood pressure 178/80, pulse 50, temperature 97.7 F (36.5 C), temperature source Oral, resp. rate 16, height 5\' 4"  (1.626 m), SpO2 9 %.   Affect appropriate Elderly black female  HEENT: normal no thyromegaly or mass  Neck supple with no adenopathy JVP normal no bruits no thyromegaly Lungs clear with no wheezing and good  diaphragmatic motion Heart:  S1/S2 SEM  murmur, no rub, gallop or click PMI normal Abdomen: benighn, BS positve, no tenderness, no AAA no bruit.  No HSM or HJR Distal pulses intact with no bruits No edema Neuro non-focal Skin warm and dry No muscular weakness   Labs:   Lab Results  Component Value Date   WBC 5.0 09/06/2014   HGB 8.1* 09/06/2014   HCT 24.4* 09/06/2014   MCV 100.4* 09/06/2014   PLT 241 09/06/2014    Recent Labs Lab 09/06/14 0212  NA 141  K 4.9  CL 109  CO2 26  BUN 47*  CREATININE 1.80*  CALCIUM 8.4*  GLUCOSE 98   Lab Results  Component Value Date   TROPONINI 0.18* 09/06/2014    Lab Results  Component Value Date   CHOL 125 10/08/2013   Lab Results  Component Value Date   HDL  62 10/08/2013   Lab Results  Component Value Date   LDLCALC 51 10/08/2013   Lab Results  Component Value Date   TRIG 62 10/08/2013   Lab Results  Component Value Date   CHOLHDL 2.0 10/08/2013   No results found for: LDLDIRECT    Radiology: Dg Chest 2 View  09/06/2014   CLINICAL DATA:  Chest pain and shortness of breath  EXAM: CHEST  2 VIEW  COMPARISON:  08/25/2014  FINDINGS: Chronic cardiomegaly.  Negative aortic and hilar contours.  Trace right pleural effusion. No pneumonia or edema. No pneumothorax.  Severe bilateral glenohumeral osteoarthritis with chronic rotator cuff tears and AC joint destruction.  IMPRESSION: Chronic cardiomegaly and trace right pleural effusion.   Electronically Signed   By: Marnee Spring M.D.   On: 09/06/2014 02:29   Dg Abd Acute W/chest  08/25/2014   CLINICAL DATA:  Acute onset of constipation for 1 week. Abdominal pain and distention. Initial encounter.  EXAM: DG ABDOMEN ACUTE W/ 1V CHEST  COMPARISON:  CT of the abdomen and pelvis, and chest and abdominal radiographs performed 03/20/2014  FINDINGS: The lungs are well-aerated. Mild vascular congestion is noted. There is no evidence of focal opacification, pleural effusion or pneumothorax. The cardiomediastinal silhouette is borderline enlarged.  The visualized bowel gas pattern is unremarkable. Scattered stool and air are seen within the colon; there is no evidence of small bowel dilatation to suggest obstruction. No free intra-abdominal air is identified on the provided upright view.  No acute osseous abnormalities are seen; the sacroiliac joints are unremarkable in appearance. There is chronic superior subluxation of both humeral heads, with underlying partial resorption of the distal clavicles and acromion bilaterally.  IMPRESSION: 1. Unremarkable bowel gas pattern; no free intra-abdominal air seen. Moderate amount of stool noted in the colon. 2. Mild vascular congestion and borderline cardiomegaly. Lungs remain  grossly clear.   Electronically Signed   By: Roanna Raider M.D.   On: 08/25/2014 06:32    EKG:  SR rate 67  RSR' no acute changes    ASSESSMENT AND PLAN:  Atypical Chest Pain:  Very vague symptoms with minimally elevated troponin in setting of CRF.  CXR with no obvious thyromegaly or tracheal deviation.  Will start with echo to assess EF and RWMA since there is cardiomegaly on CXR  R/o pericardial effusion.  Serial troponins and ECG  Dysphagia:  Barrium swallow particularly in light of significant anemia.  Check TSH and T4  Anemia:  Check b12 and folate.    Not and ideal candidate for invasive evaluation given age, anemia and CRF  SignedCharlton Haws 09/06/2014, 4:16 PM

## 2014-09-06 NOTE — ED Notes (Signed)
Pt used the bedpan.  Unable to measure.  Urine spilled on the sheets.  Cleaned pt up and changed bed linen.  Pt no complaints at this time.  No distress.

## 2014-09-06 NOTE — Progress Notes (Signed)
Patient is an 81 year old woman with a history of hypertension, who was admitted by Dr. Conley Rolls this morning for atypical chest pain/tightness and central neck pain. She was briefly seen and examined. Her chart, vital signs, laboratory studies were reviewed. Agree with Dr. Irwin Brakeman assessment and plan. Transferred to [facility] is pending.  In addition, will start gentle IV fluids with D5 normal saline as the patient was virtually nothing by mouth. Will also start sips of clear liquids as tolerated until she is evaluated by the hospitalist and cardiologist.

## 2014-09-06 NOTE — ED Notes (Signed)
Patient complaining of shortness of breath and difficulty breathing that started tonight. Patient also reports numbness to right leg and states "my leg isn't working right." Patient reports this has been going on for months. Patient complaining of burning to left leg.

## 2014-09-07 ENCOUNTER — Other Ambulatory Visit (HOSPITAL_COMMUNITY): Payer: Medicare Other

## 2014-09-07 ENCOUNTER — Observation Stay (HOSPITAL_COMMUNITY): Payer: Medicare Other

## 2014-09-07 DIAGNOSIS — D649 Anemia, unspecified: Secondary | ICD-10-CM | POA: Diagnosis not present

## 2014-09-07 DIAGNOSIS — N170 Acute kidney failure with tubular necrosis: Secondary | ICD-10-CM | POA: Diagnosis not present

## 2014-09-07 DIAGNOSIS — R0789 Other chest pain: Secondary | ICD-10-CM | POA: Insufficient documentation

## 2014-09-07 DIAGNOSIS — R7989 Other specified abnormal findings of blood chemistry: Secondary | ICD-10-CM | POA: Diagnosis not present

## 2014-09-07 DIAGNOSIS — I10 Essential (primary) hypertension: Secondary | ICD-10-CM | POA: Diagnosis not present

## 2014-09-07 DIAGNOSIS — R079 Chest pain, unspecified: Secondary | ICD-10-CM | POA: Diagnosis not present

## 2014-09-07 DIAGNOSIS — R1310 Dysphagia, unspecified: Secondary | ICD-10-CM | POA: Diagnosis not present

## 2014-09-07 LAB — VITAMIN B12: Vitamin B-12: 1315 pg/mL — ABNORMAL HIGH (ref 180–914)

## 2014-09-07 LAB — COMPREHENSIVE METABOLIC PANEL
ALBUMIN: 2.4 g/dL — AB (ref 3.5–5.0)
ALK PHOS: 64 U/L (ref 38–126)
ALT: 16 U/L (ref 14–54)
AST: 23 U/L (ref 15–41)
Anion gap: 8 (ref 5–15)
BUN: 39 mg/dL — ABNORMAL HIGH (ref 6–20)
CALCIUM: 8.7 mg/dL — AB (ref 8.9–10.3)
CHLORIDE: 108 mmol/L (ref 101–111)
CO2: 24 mmol/L (ref 22–32)
CREATININE: 1.65 mg/dL — AB (ref 0.44–1.00)
GFR calc non Af Amer: 28 mL/min — ABNORMAL LOW (ref >60)
GFR, EST AFRICAN AMERICAN: 32 mL/min — AB (ref >60)
GLUCOSE: 105 mg/dL — AB (ref 65–99)
Potassium: 4.9 mmol/L (ref 3.5–5.1)
SODIUM: 140 mmol/L (ref 135–145)
Total Bilirubin: 0.3 mg/dL (ref 0.3–1.2)
Total Protein: 5.1 g/dL — ABNORMAL LOW (ref 6.5–8.1)

## 2014-09-07 LAB — CBC
HCT: 27.2 % — ABNORMAL LOW (ref 36.0–46.0)
HEMOGLOBIN: 8.9 g/dL — AB (ref 12.0–15.0)
MCH: 32.5 pg (ref 26.0–34.0)
MCHC: 32.7 g/dL (ref 30.0–36.0)
MCV: 99.3 fL (ref 78.0–100.0)
PLATELETS: 278 10*3/uL (ref 150–400)
RBC: 2.74 MIL/uL — AB (ref 3.87–5.11)
RDW: 13 % (ref 11.5–15.5)
WBC: 4.9 10*3/uL (ref 4.0–10.5)

## 2014-09-07 LAB — RETICULOCYTES
RBC.: 2.92 MIL/uL — ABNORMAL LOW (ref 3.87–5.11)
Retic Count, Absolute: 67.2 10*3/uL (ref 19.0–186.0)
Retic Ct Pct: 2.3 % (ref 0.4–3.1)

## 2014-09-07 LAB — IRON AND TIBC
IRON: 25 ug/dL — AB (ref 28–170)
SATURATION RATIOS: 12 % (ref 10.4–31.8)
TIBC: 204 ug/dL — ABNORMAL LOW (ref 250–450)
UIBC: 179 ug/dL

## 2014-09-07 LAB — GLUCOSE, CAPILLARY: Glucose-Capillary: 88 mg/dL (ref 65–99)

## 2014-09-07 LAB — FOLATE: FOLATE: 35 ng/mL (ref >5.9)

## 2014-09-07 LAB — FERRITIN: FERRITIN: 175 ng/mL (ref 11–307)

## 2014-09-07 MED ORDER — PANTOPRAZOLE SODIUM 40 MG PO TBEC
40.0000 mg | DELAYED_RELEASE_TABLET | Freq: Two times a day (BID) | ORAL | Status: DC
  Administered 2014-09-07 – 2014-09-10 (×6): 40 mg via ORAL
  Filled 2014-09-07 (×6): qty 1

## 2014-09-07 MED ORDER — FLUCONAZOLE 100 MG PO TABS
100.0000 mg | ORAL_TABLET | Freq: Every day | ORAL | Status: DC
  Administered 2014-09-07 – 2014-09-10 (×4): 100 mg via ORAL
  Filled 2014-09-07 (×6): qty 1

## 2014-09-07 NOTE — Progress Notes (Signed)
Patient Profile: 81 y.o. female with hx of HTN but no prior cardiac history who presented with complaints of dysphagia/ chest chest pressure with swallowing. EKG on admit showed no acute changes, but her troponin was in the intermediate range of 0.19, with creatinine of 1.8 9above her baseline cr of 1.4 five months prior). She was also found to have macrocytic anemia with Hb of 8.1 g per dL.  Subjective: Still with intermittent anterior throat discomfort described as a tight sensation. No chest pain.   Objective: Vital signs in last 24 hours: Temp:  [97.5 F (36.4 C)-98.2 F (36.8 C)] 97.5 F (36.4 C) (08/22 0439) Pulse Rate:  [46-65] 65 (08/22 0439) Resp:  [14-18] 18 (08/22 0439) BP: (126-192)/(51-90) 166/65 mmHg (08/22 0439) SpO2:  [96 %-100 %] 100 % (08/22 0439) Weight:  [135 lb (61.236 kg)-191 lb 2.2 oz (86.7 kg)] 191 lb 2.2 oz (86.7 kg) (08/22 0439) Last BM Date: 09/05/14  Intake/Output from previous day: 08/21 0701 - 08/22 0700 In: 921.7 [P.O.:240; I.V.:681.7] Out: 250 [Urine:250] Intake/Output this shift:    Medications Current Facility-Administered Medications  Medication Dose Route Frequency Provider Last Rate Last Dose  . 0.9 %  sodium chloride infusion   Intravenous Continuous Russella Dar, NP      . acetaminophen (TYLENOL) tablet 650 mg  650 mg Oral Q6H PRN Houston Siren, MD       Or  . acetaminophen (TYLENOL) suppository 650 mg  650 mg Rectal Q6H PRN Houston Siren, MD      . amLODipine (NORVASC) tablet 5 mg  5 mg Oral Daily Houston Siren, MD   5 mg at 09/06/14 1057  . aspirin EC tablet 81 mg  81 mg Oral Daily Houston Siren, MD   81 mg at 09/06/14 1057  . atorvastatin (LIPITOR) tablet 20 mg  20 mg Oral Daily Houston Siren, MD   20 mg at 09/06/14 1057  . dextrose 5 %-0.9 % sodium chloride infusion   Intravenous Continuous Houston Siren, MD 50 mL/hr at 09/06/14 1940    . heparin injection 5,000 Units  5,000 Units Subcutaneous 3 times per day Houston Siren, MD   5,000 Units at 09/07/14 0502  .  hydrALAZINE (APRESOLINE) tablet 50 mg  50 mg Oral 3 times per day Leroy Sea, MD   50 mg at 09/07/14 0501  . morphine 2 MG/ML injection 2 mg  2 mg Intravenous Q3H PRN Houston Siren, MD      . multivitamin with minerals tablet 1 tablet  1 tablet Oral Daily Houston Siren, MD   1 tablet at 09/06/14 1057  . nitroGLYCERIN (NITROGLYN) 2 % ointment 0.5 inch  0.5 inch Topical 4 times per day Leroy Sea, MD   0.5 inch at 09/07/14 0509  . ondansetron (ZOFRAN) tablet 4 mg  4 mg Oral Q6H PRN Houston Siren, MD       Or  . ondansetron [facility]) injection 4 mg  4 mg Intravenous Q6H PRN Houston Siren, MD      . senna [facility]) tablet 8.6 mg  1 tablet Oral Daily Houston Siren, MD   8.6 mg at 09/06/14 1058  . sodium chloride 0.9 % injection 3 mL  3 mL Intravenous Q12H Houston Siren, MD   3 mL at 09/06/14 2146    PE: General appearance: alert, cooperative and no distress Neck: no carotid bruit and no JVD Lungs: decreased bs at the bases Heart: regular rate and rhythm and 2/3 murmur along LSB Extremities: trace  bilatearl LEE Pulses: 2+ and symmetric Skin: warm and dry Neurologic: Grossly normal  Lab Results:   Recent Labs  09/06/14 0212 09/07/14 0310  WBC 5.0 4.9  HGB 8.1* 8.9*  HCT 24.4* 27.2*  PLT 241 278   BMET  Recent Labs  09/06/14 0212 09/07/14 0310  NA 141 140  K 4.9 4.9  CL 109 108  CO2 26 24  GLUCOSE 98 105*  BUN 47* 39*  CREATININE 1.80* 1.65*  CALCIUM 8.4* 8.7*   Cardiac Panel (last 3 results)  Recent Labs  09/06/14 0212 09/06/14 0434 09/06/14 1043  TROPONINI 0.19* 0.19* 0.18*    Assessment/Plan  Principal Problem:   Dysphagia Active Problems:   Hypertension   Acute renal failure   Essential hypertension   Elevated troponin  1. Atypical Chest Pain: Very vague symptoms with minimally elevated troponin in setting of CRF. CXR with no obvious thyromegaly or tracheal deviation.mild elevation in troponin with flat trend 0.19-->0.19-->0.18. 2D echo pending to assess EF, RWMA and  also to r/o pericardial effusion since there is cardiomegaly on CXR. Note: she is not an ideal candidate for invasive evaluation given age, anemia and CRF  2. Dysphagia: Consider Barrium swallow particularly in light of significant anemia. Check TSH and T4  3. Anemia: B12 and folate WNL. Iron studies including Ferritin unremarkable.    LOS: 1 day   Brittainy M. Delmer Islam 09/07/2014 8:25 AM   The patient was seen, examined and discussed with Brittainy M. Sharol Harness, PA-C and I agree with the above.   81 year old female with atypical chest pain - and mild troponin elevation - flat response, in the settings of acute renal failure and acute anemia.  We will hold off and not plan on ischemic evaluation at this point, she needs GI evaluation.  Continue atorvastatin, ASA is ok with GI, no BB as she is bradycardic. Echo is pending, we will follow. Please call us if you have any questions.   Lars Masson 09/07/2014

## 2014-09-07 NOTE — Progress Notes (Signed)
[facility] PROGRESS NOTE  Assessment/Plan: Elevated troponin: - In the setting of chronic renal disease unlikely to be ischemic in etiology with the current cardiac biomarkers trend. 2-D echo is pending to assess for wall motion abnormality. - Her pain is made worse with foods has no relationship with exertion. - Start Full liquid diet.  Dysphagia: - Check a barium esophagogram, start Protonix by mouth twice a day. Start on Diflucan empirically. - Her hemoglobin has slowly dropped since March from 11.4-8.9. Her MCV is 99 check an anemia panel. - Plummer- Vinson syndrome is associated with difficulty swallowing and iron deficiency anemia.  Normocytic anemia: - Her MCV is 99 I am also concerned about folate or B-12 deficiency we'll go ahead and check these.  Essential hypertension: - Continue current home regimen.   Code Status: full Family Communication: none  Disposition Plan: Inpatient   Consultants:  Cardiology  Procedures:  Barium esophagus  Antibiotics:  None  HPI/Subjective: She relates she continues to have discomfort time she swallows. Some epigastric abdominal pain.  Objective: Filed Vitals:   09/06/14 1525 09/06/14 2033 09/07/14 0439 09/07/14 1204  BP: 178/80 126/51 166/65 157/47  Pulse: 50 55 65   Temp: 97.7 F (36.5 C) 98.2 F (36.8 C) 97.5 F (36.4 C)   TempSrc: Oral Oral Oral   Resp: 16  18   Height: 5\' 4"  (1.626 m)     Weight: 61.236 kg (135 lb)  86.7 kg (191 lb 2.2 oz)   SpO2: 98% 100% 100%     Intake/Output Summary (Last 24 hours) at 09/07/14 1225 Last data filed at 09/07/14 1010  Gross per 24 hour  Intake 1761.67 ml  Output    250 ml  Net 1511.67 ml   Filed Weights   09/06/14 1525 09/07/14 0439  Weight: 61.236 kg (135 lb) 86.7 kg (191 lb 2.2 oz)    Exam:  General: Alert, awake, oriented x3, in no acute distress.  HEENT: No bruits, no goiter.  Heart: Regular rate and rhythm. Lungs: Good air movement, clear Abdomen:  Soft, epigastric tenderness, nondistended, positive bowel sounds.  Neuro: Grossly intact, nonfocal.   Data Reviewed: Basic Metabolic Panel:  Recent Labs Lab 09/06/14 0212 09/07/14 0310  NA 141 140  K 4.9 4.9  CL 109 108  CO2 26 24  GLUCOSE 98 105*  BUN 47* 39*  CREATININE 1.80* 1.65*  CALCIUM 8.4* 8.7*   Liver Function Tests:  Recent Labs Lab 09/07/14 0310  AST 23  ALT 16  ALKPHOS 64  BILITOT 0.3  PROT 5.1*  ALBUMIN 2.4*   No results for input(s): LIPASE, AMYLASE in the last 168 hours. No results for input(s): AMMONIA in the last 168 hours. CBC:  Recent Labs Lab 09/06/14 0212 09/07/14 0310  WBC 5.0 4.9  NEUTROABS 2.6  --   HGB 8.1* 8.9*  HCT 24.4* 27.2*  MCV 100.4* 99.3  PLT 241 278   Cardiac Enzymes:  Recent Labs Lab 09/06/14 0212 09/06/14 0434 09/06/14 1043  TROPONINI 0.19* 0.19* 0.18*   BNP (last 3 results)  Recent Labs  09/06/14 0218  BNP 327.0*    ProBNP (last 3 results)  Recent Labs  10/08/13 0216  PROBNP 531.4*    CBG:  Recent Labs Lab 09/06/14 2345 09/07/14 0607  GLUCAP 90 88    No results found for this or any previous visit (from the past 240 hour(s)).   Studies: Dg Chest 2 View  09/06/2014   CLINICAL DATA:  Chest pain and shortness  of breath  EXAM: CHEST  2 VIEW  COMPARISON:  08/25/2014  FINDINGS: Chronic cardiomegaly.  Negative aortic and hilar contours.  Trace right pleural effusion. No pneumonia or edema. No pneumothorax.  Severe bilateral glenohumeral osteoarthritis with chronic rotator cuff tears and AC joint destruction.  IMPRESSION: Chronic cardiomegaly and trace right pleural effusion.   Electronically Signed   By: Marnee Spring M.D.   On: 09/06/2014 02:29   Dg Esophagus  09/07/2014   CLINICAL DATA:  Chest pain and abdominal pain.  EXAM: ESOPHOGRAM/BARIUM SWALLOW  TECHNIQUE: Single contrast examination was performed using  thin barium.  FLUOROSCOPY TIME:  Radiation Exposure Index (as provided by the  fluoroscopic device):  If the device does not provide the exposure index:  Fluoroscopy Time:  1 minute, 42 seconds.  Number of Acquired Images:  0  COMPARISON:  09/06/2014  FINDINGS: Mild leftward deviation of the esophagus in the lower neck, significance uncertain.  Primary peristaltic waves were disrupted on 2 out of 4 swallows.  Mildly dilated esophagus. Narrowed distal esophagus at the gastroesophageal junction, maximum distension only to 9 mm. A 13 mm barium tablet extended down to the distal esophagus but subsequently would not pass, simply moving proximally and distally in the esophagus.  Occasional tertiary contractions noted.  IMPRESSION: 1. Distal esophageal stricture/narrowing, not distended beyond 9 mm. Endoscopy may be warranted for further characterization. The esophagus proximal to this is slightly dilated, and achalasia is not excluded. 2. Mild leftward deviation of the esophagus in the lower neck, cause uncertain but possibly from a goiter. 3. Mild nonspecific esophageal dysmotility disorder.   Electronically Signed   By: Gaylyn Rong M.D.   On: 09/07/2014 11:55    Scheduled Meds: . amLODipine  5 mg Oral Daily  . aspirin EC  81 mg Oral Daily  . atorvastatin  20 mg Oral Daily  . heparin  5,000 Units Subcutaneous 3 times per day  . hydrALAZINE  50 mg Oral 3 times per day  . multivitamin with minerals  1 tablet Oral Daily  . nitroGLYCERIN  0.5 inch Topical 4 times per day  . senna  1 tablet Oral Daily  . sodium chloride  3 mL Intravenous Q12H   Continuous Infusions: . sodium chloride    . dextrose 5 % and 0.9% NaCl 50 mL/hr at 09/06/14 1940    Time Spent: 25 min   Marinda Elk  [facility] Pager [phone]. If 7PM-7AM, please contact night-coverage at [email or web address], password [email or web address] 09/07/2014, 12:25 PM  LOS: 1 day

## 2014-09-07 NOTE — Progress Notes (Signed)
  Echocardiogram 2D Echocardiogram has been performed.  Joyce Carpenter 09/07/2014, 3:21 PM

## 2014-09-08 DIAGNOSIS — I10 Essential (primary) hypertension: Secondary | ICD-10-CM | POA: Diagnosis not present

## 2014-09-08 DIAGNOSIS — R0789 Other chest pain: Secondary | ICD-10-CM | POA: Diagnosis not present

## 2014-09-08 DIAGNOSIS — D649 Anemia, unspecified: Secondary | ICD-10-CM | POA: Diagnosis not present

## 2014-09-08 DIAGNOSIS — R1310 Dysphagia, unspecified: Secondary | ICD-10-CM | POA: Diagnosis not present

## 2014-09-08 DIAGNOSIS — N170 Acute kidney failure with tubular necrosis: Secondary | ICD-10-CM | POA: Diagnosis not present

## 2014-09-08 DIAGNOSIS — R7989 Other specified abnormal findings of blood chemistry: Secondary | ICD-10-CM | POA: Diagnosis not present

## 2014-09-08 MED ORDER — SODIUM CHLORIDE 0.9 % IV SOLN
INTRAVENOUS | Status: DC
  Administered 2014-09-09: 01:00:00 via INTRAVENOUS

## 2014-09-08 NOTE — Progress Notes (Addendum)
[facility] PROGRESS NOTE  Assessment/Plan: Elevated troponin: - In the setting of chronic renal disease unlikely to be ischemic in etiology with the current cardiac biomarkers trend. 2-D echo as below - Start Full liquid diet.  Dysphagia: - Esophagogram showed distal esophageal narrowing. - Her hemoglobin has slowly dropped since March from 11.4-8.9.  - Plummer- Vinson syndrome is associated with difficulty swallowing and iron deficiency anemia, but ferritin 172.  Normocytic anemia: - Her MCV is 99 b12 and folate WNL.  Essential hypertension: - Continue current home regimen.   Code Status: full Family Communication: none  Disposition Plan: Inpatient   Consultants:  Cardiology  Procedures:  Barium esophagus  Echo Systolic function was normal. The estimated ejection fraction was in the range of 60% to 65%. Wall motion was normal; there were no regional wall motion abnormalities. Doppler parameters are consistent with abnormal left ventricular relaxation (grade 1 diastolic dysfunction).  Antibiotics:  None  HPI/Subjective: Tolerating clear liq diet  Objective: Filed Vitals:   09/08/14 0220 09/08/14 0510 09/08/14 0746 09/08/14 1003  BP: 134/52 132/53 136/47 121/61  Pulse: 69 72 71 75  Temp: 98.7 F (37.1 C) 98.7 F (37.1 C) 98.6 F (37 C)   TempSrc: Oral Oral Oral   Resp: 19 20 20 18   Height:      Weight:  85.7 kg (188 lb 15 oz)    SpO2: 100% 100% 99% 100%    Intake/Output Summary (Last 24 hours) at 09/08/14 1233 Last data filed at 09/08/14 1003  Gross per 24 hour  Intake   1840 ml  Output      0 ml  Net   1840 ml   Filed Weights   09/06/14 1525 09/07/14 0439 09/08/14 0510  Weight: 61.236 kg (135 lb) 86.7 kg (191 lb 2.2 oz) 85.7 kg (188 lb 15 oz)    Exam:  General: Alert, awake, oriented x3, in no acute distress.  HEENT: No bruits, no goiter.  Heart: Regular rate and rhythm. Lungs: Good air movement, clear Abdomen: Soft,  epigastric tenderness, nondistended, positive bowel sounds.  Neuro: Grossly intact, nonfocal.   Data Reviewed: Basic Metabolic Panel:  Recent Labs Lab 09/06/14 0212 09/07/14 0310  NA 141 140  K 4.9 4.9  CL 109 108  CO2 26 24  GLUCOSE 98 105*  BUN 47* 39*  CREATININE 1.80* 1.65*  CALCIUM 8.4* 8.7*   Liver Function Tests:  Recent Labs Lab 09/07/14 0310  AST 23  ALT 16  ALKPHOS 64  BILITOT 0.3  PROT 5.1*  ALBUMIN 2.4*   No results for input(s): LIPASE, AMYLASE in the last 168 hours. No results for input(s): AMMONIA in the last 168 hours. CBC:  Recent Labs Lab 09/06/14 0212 09/07/14 0310  WBC 5.0 4.9  NEUTROABS 2.6  --   HGB 8.1* 8.9*  HCT 24.4* 27.2*  MCV 100.4* 99.3  PLT 241 278   Cardiac Enzymes:  Recent Labs Lab 09/06/14 0212 09/06/14 0434 09/06/14 1043  TROPONINI 0.19* 0.19* 0.18*   BNP (last 3 results)  Recent Labs  09/06/14 0218  BNP 327.0*    ProBNP (last 3 results)  Recent Labs  10/08/13 0216  PROBNP 531.4*    CBG:  Recent Labs Lab 09/06/14 2345 09/07/14 0607  GLUCAP 90 88    No results found for this or any previous visit (from the past 240 hour(s)).   Studies: Dg Esophagus  09/07/2014   CLINICAL DATA:  Chest pain and abdominal pain.  EXAM: ESOPHOGRAM/BARIUM SWALLOW  TECHNIQUE: Single contrast examination was performed using  thin barium.  FLUOROSCOPY TIME:  Radiation Exposure Index (as provided by the fluoroscopic device):  If the device does not provide the exposure index:  Fluoroscopy Time:  1 minute, 42 seconds.  Number of Acquired Images:  0  COMPARISON:  09/06/2014  FINDINGS: Mild leftward deviation of the esophagus in the lower neck, significance uncertain.  Primary peristaltic waves were disrupted on 2 out of 4 swallows.  Mildly dilated esophagus. Narrowed distal esophagus at the gastroesophageal junction, maximum distension only to 9 mm. A 13 mm barium tablet extended down to the distal esophagus but subsequently  would not pass, simply moving proximally and distally in the esophagus.  Occasional tertiary contractions noted.  IMPRESSION: 1. Distal esophageal stricture/narrowing, not distended beyond 9 mm. Endoscopy may be warranted for further characterization. The esophagus proximal to this is slightly dilated, and achalasia is not excluded. 2. Mild leftward deviation of the esophagus in the lower neck, cause uncertain but possibly from a goiter. 3. Mild nonspecific esophageal dysmotility disorder.   Electronically Signed   By: Gaylyn Rong M.D.   On: 09/07/2014 11:55    Scheduled Meds: . amLODipine  5 mg Oral Daily  . aspirin EC  81 mg Oral Daily  . atorvastatin  20 mg Oral Daily  . fluconazole  100 mg Oral Daily  . heparin  5,000 Units Subcutaneous 3 times per day  . hydrALAZINE  50 mg Oral 3 times per day  . multivitamin with minerals  1 tablet Oral Daily  . nitroGLYCERIN  0.5 inch Topical 4 times per day  . pantoprazole  40 mg Oral BID  . senna  1 tablet Oral Daily  . sodium chloride  3 mL Intravenous Q12H   Continuous Infusions: . sodium chloride      Time Spent: 25 min   Marinda Elk  [facility] Pager [phone]. If 7PM-7AM, please contact night-coverage at [email or web address], password [employer] [facility] 09/08/2014, 12:33 PM  LOS: 2 days

## 2014-09-08 NOTE — Progress Notes (Signed)
Patient Profile: 81 y.o. female with hx of HTN but no prior cardiac history who presented with complaints of dysphagia/ chest chest pressure with swallowing. EKG on admit showed no acute changes, but her troponin was in the intermediate range of 0.19, with creatinine of 1.8 9above her baseline cr of 1.4 five months prior). She was also found to have macrocytic anemia with Hb of 8.1 g per dL.  Subjective: Still with intermittent anterior throat discomfort described as a tight sensation. No chest pain.   Objective: Vital signs in last 24 hours: Temp:  [98 F (36.7 C)-98.7 F (37.1 C)] 98.6 F (37 C) (08/23 0746) Pulse Rate:  [67-72] 71 (08/23 0746) Resp:  [18-20] 20 (08/23 0746) BP: (124-157)/(39-88) 136/47 mmHg (08/23 0746) SpO2:  [99 %-100 %] 99 % (08/23 0746) Weight:  [188 lb 15 oz (85.7 kg)] 188 lb 15 oz (85.7 kg) (08/23 0510) Last BM Date: 09/07/14  Intake/Output from previous day: 08/22 0701 - 08/23 0700 In: 1720 [P.O.:1720] Out: -  Intake/Output this shift:    Medications Current Facility-Administered Medications  Medication Dose Route Frequency Provider Last Rate Last Dose  . 0.9 %  sodium chloride infusion   Intravenous Continuous Marinda Elk, MD      . acetaminophen (TYLENOL) tablet 650 mg  650 mg Oral Q6H PRN Houston Siren, MD       Or  . acetaminophen (TYLENOL) suppository 650 mg  650 mg Rectal Q6H PRN Houston Siren, MD      . amLODipine (NORVASC) tablet 5 mg  5 mg Oral Daily Houston Siren, MD   5 mg at 09/07/14 1204  . aspirin EC tablet 81 mg  81 mg Oral Daily Houston Siren, MD   81 mg at 09/07/14 1205  . atorvastatin (LIPITOR) tablet 20 mg  20 mg Oral Daily Houston Siren, MD   20 mg at 09/07/14 1825  . fluconazole (DIFLUCAN) tablet 100 mg  100 mg Oral Daily Marinda Elk, MD   100 mg at 09/07/14 1826  . heparin injection 5,000 Units  5,000 Units Subcutaneous 3 times per day Houston Siren, MD   5,000 Units at 09/08/14 1610  . hydrALAZINE (APRESOLINE) tablet 50 mg  50 mg Oral 3  times per day Leroy Sea, MD   50 mg at 09/08/14 9604  . morphine 2 MG/ML injection 2 mg  2 mg Intravenous Q3H PRN Houston Siren, MD      . multivitamin with minerals tablet 1 tablet  1 tablet Oral Daily Houston Siren, MD   1 tablet at 09/07/14 1205  . nitroGLYCERIN (NITROGLYN) 2 % ointment 0.5 inch  0.5 inch Topical 4 times per day Leroy Sea, MD   0.5 inch at 09/08/14 [phone]  . ondansetron (ZOFRAN) tablet 4 mg  4 mg Oral Q6H PRN Houston Siren, MD       Or  . ondansetron [facility]) injection 4 mg  4 mg Intravenous Q6H PRN Houston Siren, MD      . pantoprazole (PROTONIX) EC tablet 40 mg  40 mg Oral BID Marinda Elk, MD   40 mg at 09/07/14 2244  . senna (SENOKOT) tablet 8.6 mg  1 tablet Oral Daily Houston Siren, MD   8.6 mg at 09/07/14 1213  . sodium chloride 0.9 % injection 3 mL  3 mL Intravenous Q12H Houston Siren, MD   3 mL at 09/07/14 2246    PE: General appearance: alert, cooperative and no distress Neck: no carotid bruit and  no JVD Lungs: decreased bs at the bases Heart: regular rate and rhythm and 2/3 murmur along LSB Extremities: trace bilatearl LEE Pulses: 2+ and symmetric Skin: warm and dry Neurologic: Grossly normal  Lab Results:   Recent Labs  09/06/14 0212 09/07/14 0310  WBC 5.0 4.9  HGB 8.1* 8.9*  HCT 24.4* 27.2*  PLT 241 278   BMET  Recent Labs  09/06/14 0212 09/07/14 0310  NA 141 140  K 4.9 4.9  CL 109 108  CO2 26 24  GLUCOSE 98 105*  BUN 47* 39*  CREATININE 1.80* 1.65*  CALCIUM 8.4* 8.7*   Cardiac Panel (last 3 results)  Recent Labs  09/06/14 0212 09/06/14 0434 09/06/14 1043  TROPONINI 0.19* 0.19* 0.18*    Assessment/Plan  Principal Problem:   Dysphagia Active Problems:   Hypertension   Acute renal failure   Essential hypertension   Elevated troponin   Absolute anemia   Atypical chest pain  1. Atypical Chest Pain: Very vague symptoms with minimally elevated troponin in setting of CRF. CXR with no obvious thyromegaly or tracheal deviation.mild  elevation in troponin with flat trend 0.19-->0.19-->0.18. 2D echo pending to assess EF, RWMA and also to r/o pericardial effusion since there is cardiomegaly on CXR. Note: she is not an ideal candidate for invasive evaluation given age, anemia and CRF  2. Dysphagia: Consider Barrium swallow particularly in light of significant anemia. Check TSH and T4  3. Anemia: B12 and folate WNL. Iron studies including Ferritin unremarkable.   81 year old female with atypical chest pain - and mild troponin elevation - flat response, in the settings of acute renal failure and acute anemia.  Her echo shows normal LVEF with no regional wall motion abnormalities. We are not planning on any ischemic evauation, follow up with GI.  Continue atorvastatin, ASA is ok with GI, no BB as she is bradycardic. Please call us if you have any questions. We will sign off.  Lars Masson 09/08/2014

## 2014-09-08 NOTE — Consult Note (Signed)
[facility] Consult Note  Referring Provider: No ref. provider found Primary Care Physician:  Maricela Curet, MD Primary Gastroenterologist:  Dr.  Laurel Dimmer Complaint: Dysphagia HPI: Joyce Carpenter is an 81 y.o. black female  who presents with gradually worsening solid greater than liquid dysphagia. She has had some forced regurgitation. She had a barium swallow yesterday which showed somewhat dilated esophagus with dysmotility and a distal smooth-appearing stricture with hanging up of the barium tablet. She denies any significant weight loss.  Past Medical History  Diagnosis Date  . Hypertension   . Shortness of breath dyspnea     Past Surgical History  Procedure Laterality Date  . Ventral hernia repair N/A 03/25/2014    Procedure: VENTRAL HERIORRHAPHY WITH MESH;  Surgeon: Aviva Signs Md, MD;  Location: [facility];  Service: General;  Laterality: N/A;  . Insertion of mesh  03/25/2014    Procedure: INSERTION OF MESH;  Surgeon: Aviva Signs Md, MD;  Location: [facility];  Service: General;;    Medications Prior to Admission  Medication Sig Dispense Refill  . amLODipine (NORVASC) 5 MG tablet Take 5 mg by mouth daily.    Marland Kitchen aspirin EC 81 MG tablet Take 81 mg by mouth every other day.     Marland Kitchen atorvastatin (LIPITOR) 20 MG tablet Take 20 mg by mouth daily.    . cloNIDine (CATAPRES) 0.1 MG tablet Take 0.1 mg by mouth 2 (two) times daily.    . ferrous sulfate 325 (65 FE) MG tablet Take 325 mg by mouth daily with breakfast.    . HYDROcodone-acetaminophen (NORCO/VICODIN) 5-325 MG per tablet Take 1 tablet by mouth every 4 (four) hours as needed for moderate pain. 30 tablet 0  . lisinopril-hydrochlorothiazide (PRINZIDE,ZESTORETIC) 20-12.5 MG per tablet Take 1 tablet by mouth daily.    . Multiple Vitamins-Minerals ([employer] SPECTRAVITE ADULT 50+) TABS Take 1 tablet by mouth daily.    . Multiple Vitamins-Minerals (MULTIVITAMIN WITH MINERALS) tablet Take 1 tablet by mouth daily.    . naproxen  (NAPROSYN) 500 MG tablet Take 500 mg by mouth 2 (two) times daily as needed for mild pain.     Marland Kitchen oxycodone (OXY-IR) 5 MG capsule Take 5 mg by mouth 3 (three) times daily as needed for pain.     Marland Kitchen senna (SENOKOT) 8.6 MG tablet Take 1 tablet by mouth daily.      Allergies: No Known Allergies  History reviewed. No pertinent family history.  Social History:  reports that she has never smoked. She does not have any smokeless tobacco history on file. She reports that she does not drink alcohol or use illicit drugs.  Review of Systems: negative except as above   Blood pressure 132/50, pulse 75, temperature 98.6 F (37 C), temperature source Oral, resp. rate 22, height 5' 4"  (1.626 m), weight 85.7 kg (188 lb 15 oz), SpO2 100 %. Head: Normocephalic, without obvious abnormality, atraumatic Neck: no adenopathy, no carotid bruit, no JVD, supple, symmetrical, trachea midline and thyroid not enlarged, symmetric, no tenderness/mass/nodules Resp: clear to auscultation bilaterally Cardio: regular rate and rhythm, S1, S2 normal, no murmur, click, rub or gallop GI: Abdomen soft nondistended with normoactive bowel sounds. No hepatosplenomegaly mass or guarding. Extremities: extremities normal, atraumatic, no cyanosis or edema  Results for orders placed or performed during the hospital encounter of 09/06/14 (from the past 48 hour(s))  Glucose, capillary     Status: None   Collection Time: 09/06/14 11:45 PM  Result Value Ref Range   Glucose-Capillary 90 65 -  99 mg/dL  Comprehensive metabolic panel     Status: Abnormal   Collection Time: 09/07/14  3:10 AM  Result Value Ref Range   Sodium 140 135 - 145 mmol/L   Potassium 4.9 3.5 - 5.1 mmol/L   Chloride 108 101 - 111 mmol/L   CO2 24 22 - 32 mmol/L   Glucose, Bld 105 (H) 65 - 99 mg/dL   BUN 39 (H) 6 - 20 mg/dL   Creatinine, Ser 1.65 (H) 0.44 - 1.00 mg/dL   Calcium 8.7 (L) 8.9 - 10.3 mg/dL   Total Protein 5.1 (L) 6.5 - 8.1 g/dL   Albumin 2.4 (L) 3.5 -  5.0 g/dL   AST 23 15 - 41 U/L   ALT 16 14 - 54 U/L   Alkaline Phosphatase 64 38 - 126 U/L   Total Bilirubin 0.3 0.3 - 1.2 mg/dL   GFR calc non Af Amer 28 (L) >60 mL/min   GFR calc Af Amer 32 (L) >60 mL/min    Comment: (NOTE) The eGFR has been calculated using the CKD EPI equation. This calculation has not been validated in all clinical situations. eGFR's persistently <60 mL/min signify possible Chronic Kidney Disease.    Anion gap 8 5 - 15  CBC     Status: Abnormal   Collection Time: 09/07/14  3:10 AM  Result Value Ref Range   WBC 4.9 4.0 - 10.5 K/uL   RBC 2.74 (L) 3.87 - 5.11 MIL/uL   Hemoglobin 8.9 (L) 12.0 - 15.0 g/dL   HCT 27.2 (L) 36.0 - 46.0 %   MCV 99.3 78.0 - 100.0 fL   MCH 32.5 26.0 - 34.0 pg   MCHC 32.7 30.0 - 36.0 g/dL   RDW 13.0 11.5 - 15.5 %   Platelets 278 150 - 400 K/uL  Glucose, capillary     Status: None   Collection Time: 09/07/14  6:07 AM  Result Value Ref Range   Glucose-Capillary 88 65 - 99 mg/dL  Vitamin B12     Status: Abnormal   Collection Time: 09/07/14  2:11 PM  Result Value Ref Range   Vitamin B-12 1315 (H) 180 - 914 pg/mL    Comment: (NOTE) This assay is not validated for testing neonatal or myeloproliferative syndrome specimens for Vitamin B12 levels.   Folate     Status: None   Collection Time: 09/07/14  2:11 PM  Result Value Ref Range   Folate 35.0 >5.9 ng/mL  Iron and TIBC     Status: Abnormal   Collection Time: 09/07/14  2:11 PM  Result Value Ref Range   Iron 25 (L) 28 - 170 ug/dL   TIBC 204 (L) 250 - 450 ug/dL   Saturation Ratios 12 10.4 - 31.8 %   UIBC 179 ug/dL  Ferritin     Status: None   Collection Time: 09/07/14  2:11 PM  Result Value Ref Range   Ferritin 175 11 - 307 ng/mL  Reticulocytes     Status: Abnormal   Collection Time: 09/07/14  2:11 PM  Result Value Ref Range   Retic Ct Pct 2.3 0.4 - 3.1 %   RBC. 2.92 (L) 3.87 - 5.11 MIL/uL   Retic Count, Manual 67.2 19.0 - 186.0 K/uL   Dg Esophagus  09/07/2014   CLINICAL  DATA:  Chest pain and abdominal pain.  EXAM: ESOPHOGRAM/BARIUM SWALLOW  TECHNIQUE: Single contrast examination was performed using  thin barium.  FLUOROSCOPY TIME:  Radiation Exposure Index (as provided by the fluoroscopic device):  If the device does not provide the exposure index:  Fluoroscopy Time:  1 minute, 42 seconds.  Number of Acquired Images:  0  COMPARISON:  09/06/2014  FINDINGS: Mild leftward deviation of the esophagus in the lower neck, significance uncertain.  Primary peristaltic waves were disrupted on 2 out of 4 swallows.  Mildly dilated esophagus. Narrowed distal esophagus at the gastroesophageal junction, maximum distension only to 9 mm. A 13 mm barium tablet extended down to the distal esophagus but subsequently would not pass, simply moving proximally and distally in the esophagus.  Occasional tertiary contractions noted.  IMPRESSION: 1. Distal esophageal stricture/narrowing, not distended beyond 9 mm. Endoscopy may be warranted for further characterization. The esophagus proximal to this is slightly dilated, and achalasia is not excluded. 2. Mild leftward deviation of the esophagus in the lower neck, cause uncertain but possibly from a goiter. 3. Mild nonspecific esophageal dysmotility disorder.   Electronically Signed   By: Van Clines M.D.   On: 09/07/2014 11:55    Assessment: Solid greater than liquid dysphagia with barium swallow suggestive of a benign stricture or possibly achalasia, cannot rule out neoplasm. Plan:  Plan EGD with possible dilatation versus biopsy tomorrow. Risks rationale and alternatives were explained to the patient she wishes to proceed. Lowana Hable C 09/08/2014, 2:52 PM  Pager [phone] If no answer or after 5 PM call [phone]

## 2014-09-09 ENCOUNTER — Observation Stay (HOSPITAL_COMMUNITY): Payer: Medicare Other | Admitting: Anesthesiology

## 2014-09-09 ENCOUNTER — Encounter (HOSPITAL_COMMUNITY): Payer: Self-pay | Admitting: *Deleted

## 2014-09-09 ENCOUNTER — Encounter (HOSPITAL_COMMUNITY)
Admission: EM | Disposition: A | Discharge status: Skilled Nursing Facility | Payer: Self-pay | Source: Home / Self Care | Attending: Internal Medicine

## 2014-09-09 DIAGNOSIS — R0789 Other chest pain: Secondary | ICD-10-CM

## 2014-09-09 DIAGNOSIS — N183 Chronic kidney disease, stage 3 (moderate): Secondary | ICD-10-CM

## 2014-09-09 DIAGNOSIS — E785 Hyperlipidemia, unspecified: Secondary | ICD-10-CM | POA: Diagnosis not present

## 2014-09-09 DIAGNOSIS — R1310 Dysphagia, unspecified: Secondary | ICD-10-CM | POA: Diagnosis not present

## 2014-09-09 DIAGNOSIS — R4702 Dysphasia: Secondary | ICD-10-CM | POA: Diagnosis not present

## 2014-09-09 DIAGNOSIS — R7989 Other specified abnormal findings of blood chemistry: Secondary | ICD-10-CM | POA: Diagnosis not present

## 2014-09-09 DIAGNOSIS — I129 Hypertensive chronic kidney disease with stage 1 through stage 4 chronic kidney disease, or unspecified chronic kidney disease: Secondary | ICD-10-CM | POA: Diagnosis not present

## 2014-09-09 DIAGNOSIS — K222 Esophageal obstruction: Secondary | ICD-10-CM | POA: Diagnosis not present

## 2014-09-09 DIAGNOSIS — I10 Essential (primary) hypertension: Secondary | ICD-10-CM | POA: Diagnosis not present

## 2014-09-09 DIAGNOSIS — K224 Dyskinesia of esophagus: Secondary | ICD-10-CM | POA: Diagnosis not present

## 2014-09-09 HISTORY — PX: SAVORY DILATION: SHX5439

## 2014-09-09 HISTORY — PX: ESOPHAGOGASTRODUODENOSCOPY (EGD) WITH PROPOFOL: SHX5813

## 2014-09-09 SURGERY — EGD, WITH DILATION USING SAVARY-GILLIARD DILATOR OVER GUIDEWIRE
Anesthesia: Monitor Anesthesia Care | Laterality: Left

## 2014-09-09 MED ORDER — PROPOFOL 10 MG/ML IV BOLUS
INTRAVENOUS | Status: DC | PRN
  Administered 2014-09-09: 20 mg via INTRAVENOUS

## 2014-09-09 MED ORDER — LACTATED RINGERS IV SOLN: Freq: Once | INTRAVENOUS | Status: DC

## 2014-09-09 MED ORDER — BUTAMBEN-TETRACAINE-BENZOCAINE 2-2-14 % EX AERO
INHALATION_SPRAY | CUTANEOUS | Status: DC | PRN
  Administered 2014-09-09: 1 via TOPICAL

## 2014-09-09 MED ORDER — LIDOCAINE HCL 1 % IJ SOLN
INTRAMUSCULAR | Status: DC | PRN
  Administered 2014-09-09: 40 mg via INTRADERMAL

## 2014-09-09 MED ORDER — LACTATED RINGERS IV SOLN
INTRAVENOUS | Status: DC | PRN
  Administered 2014-09-09: 10:00:00 via INTRAVENOUS

## 2014-09-09 MED ORDER — ONDANSETRON HCL 4 MG/2ML IJ SOLN
INTRAMUSCULAR | Status: DC | PRN
  Administered 2014-09-09: 4 mg via INTRAVENOUS

## 2014-09-09 NOTE — Progress Notes (Addendum)
Patient ID: Joyce Carpenter, female   DOB: 02-04-1930, 81 y.o.   MRN: [id number] [facility] PROGRESS NOTE  NOELLA KIPNIS ION:[id number] DOB: [birth year] DOA: 09/06/2014 PCP: [name], MD  Brief narrative:    81 y.o. female with past medical history of hypertension, dyslipidemia, iron deficiency anemia, lives at home with her niece who presented with difficulty swallowing for some time, solids more so than liquids. She also complained of some chest tightness without palpitations. On admission, patient was found to have mild elevation in troponin, 0.19, no acute ischemic changes on 12-lead EKG. Her blood work was further significant for creatinine of 1.6, baseline creatinine is 1.4. Cardiology has seen the patient in consultation. Further workup included a barium swallow to evaluate for dysphagia to solids and liquids. Barium swallow demonstrated benign stricture or possible achalasia versus neoplasm. GI has seen the patient in consultation and plans for EGD 09/09/2014.   Assessment/Plan:    Principal Problem: Dysphagia - Dysphasia to solids more than liquids. - Patient underwent barium swallow which is suggestive of benign stricture or possibly achalasia however neoplasm is not ruled out - GI has seen the patient in consultation - Plan is for EGD for further evaluation today 09/09/2014 - Keep nothing by mouth until EGD completed.  Active Problems: Atypical Chest Pain / Troponin level elevated  - Patient presented with very vague symptoms  - Troponin level stable at 0.18 - 0.19 - Cardiology has seen the patient in consultation  - 2-D echo on this admission demonstrated ejection fraction of 60% with grade 1 diastolic dysfunction. - Per cardiology patient is not a candidate for invasive evaluation given age, anemia and chronic renal failure  Essential hypertension - Continue Norvasc, hydralazine  Dyslipidemia - Continue statin therapy  Chronic kidney disease,  stage III - Baseline creatinine is 1.4 and on this admission 1.6 - We will continue to monitor renal function  Anemia of chronic disease - Likely secondary to anemia of chronic kidney disease - Hemoglobin is 8.9, stable - No current indications for transfusion  Generalized weakness - We will get physical therapy evaluation   DVT Prophylaxis  - Heparin subQ ordered    Code Status: Full.  Family Communication:  Family not at the bedside this am  Disposition Plan: Needs PT evaluation   IV access:  Peripheral IV  Procedures and diagnostic studies:    Dg Chest 2 View 09/06/2014   Chronic cardiomegaly and trace right pleural effusion.   Electronically Signed   By: Marnee Spring M.D.   On: 09/06/2014 02:29   Dg Esophagus 09/07/2014   1. Distal esophageal stricture/narrowing, not distended beyond 9 mm. Endoscopy may be warranted for further characterization. The esophagus proximal to this is slightly dilated, and achalasia is not excluded. 2. Mild leftward deviation of the esophagus in the lower neck, cause uncertain but possibly from a goiter. 3. Mild nonspecific esophageal dysmotility disorder.   Electronically Signed   By: Gaylyn Rong M.D.   On: 09/07/2014 11:55   Medical Consultants:  [facility], Dr. Dorena Cookey  Other Consultants:  Physical therapy  IAnti-Infectives:   None     Manson Passey, MD  [facility] Pager [phone]  Time spent in minutes: 25 minutes  If 7PM-7AM, please contact night-coverage [email or web address] Password [facility] 09/09/2014, 10:23 AM   LOS: 3 days    HPI/Subjective: No acute overnight events. Patient reportsno nausea or vomiting.   Objective: Filed Vitals:   09/08/14 2128 09/09/14 7253 09/09/14 6644 09/09/14 0347  BP: 130/52 132/58 138/46 158/71  Pulse: 72 70 71   Temp: 98.4 F (36.9 C) 98 F (36.7 C) 98.7 F (37.1 C) 98.5 F (36.9 C)  TempSrc: Oral Oral Oral   Resp: 20 18 23 22   Height:      Weight:  88.5  kg (195 lb 1.7 oz)    SpO2: 100% 100% 98% 99%    Intake/Output Summary (Last 24 hours) at 09/09/14 1023 Last data filed at 09/09/14 0858  Gross per 24 hour  Intake   2040 ml  Output      0 ml  Net   2040 ml    Exam:   General:  Pt is alert, not in acute distress  Cardiovascular: Regular rate and rhythm, S1/S2 (+)  Respiratory: Clear to auscultation bilaterally, no wheezing, no crackles, no rhonchi  Abdomen: Soft, non tender, non distended, bowel sounds present  Extremities: No edema, pulses DP and PT palpable bilaterally  Neuro: Grossly nonfocal  Data Reviewed: Basic Metabolic Panel:  Recent Labs Lab 09/06/14 0212 09/07/14 0310  NA 141 140  K 4.9 4.9  CL 109 108  CO2 26 24  GLUCOSE 98 105*  BUN 47* 39*  CREATININE 1.80* 1.65*  CALCIUM 8.4* 8.7*   Liver Function Tests:  Recent Labs Lab 09/07/14 0310  AST 23  ALT 16  ALKPHOS 64  BILITOT 0.3  PROT 5.1*  ALBUMIN 2.4*   No results for input(s): LIPASE, AMYLASE in the last 168 hours. No results for input(s): AMMONIA in the last 168 hours. CBC:  Recent Labs Lab 09/06/14 0212 09/07/14 0310  WBC 5.0 4.9  NEUTROABS 2.6  --   HGB 8.1* 8.9*  HCT 24.4* 27.2*  MCV 100.4* 99.3  PLT 241 278   Cardiac Enzymes:  Recent Labs Lab 09/06/14 0212 09/06/14 0434 09/06/14 1043  TROPONINI 0.19* 0.19* 0.18*   BNP: Invalid input(s): POCBNP CBG:  Recent Labs Lab 09/06/14 2345 09/07/14 0607  GLUCAP 90 88    No results found for this or any previous visit (from the past 240 hour(s)).   Scheduled Meds: . amLODipine  5 mg Oral Daily  . aspirin EC  81 mg Oral Daily  . atorvastatin  20 mg Oral Daily  . fluconazole  100 mg Oral Daily  . heparin  5,000 Units Subcutaneous 3 times per day  . hydrALAZINE  50 mg Oral 3 times per day  . lactated ringers   Intravenous Once  . multivitamin with minerals  1 tablet Oral Daily  . nitroGLYCERIN  0.5 inch Topical 4 times per day  . pantoprazole  40 mg Oral BID  .  senna  1 tablet Oral Daily  . sodium chloride  3 mL Intravenous Q12H   Continuous Infusions: . sodium chloride    . sodium chloride 20 mL/hr at 09/09/14 0030

## 2014-09-09 NOTE — Anesthesia Procedure Notes (Signed)
Procedure Name: MAC Date/Time: 09/09/2014 10:30 AM Performed by: Carmela Rima Pre-anesthesia Checklist: Timeout performed, Patient being monitored, Suction available, Emergency Drugs available and Patient identified Patient Re-evaluated:Patient Re-evaluated prior to inductionOxygen Delivery Method: Nasal cannula

## 2014-09-09 NOTE — Transfer of Care (Signed)
Immediate Anesthesia Transfer of Care Note  Patient: Joyce Carpenter  Procedure(s) Performed: Procedure(s): SAVORY DILATION (Left)  Patient Location: Endoscopy Unit  Anesthesia Type:MAC  Level of Consciousness: awake, alert  and oriented  Airway & Oxygen Therapy: Patient Spontanous Breathing and Patient connected to nasal cannula oxygen  Post-op Assessment: Report given to RN, Post -op Vital signs reviewed and stable and Patient moving all extremities X 4  Post vital signs: Reviewed and stable  Last Vitals:  Filed Vitals:   09/09/14 0942  BP: 158/71  Pulse:   Temp: 36.9 C  Resp: 22    Complications: No apparent anesthesia complications

## 2014-09-09 NOTE — Anesthesia Postprocedure Evaluation (Signed)
  Anesthesia Post-op Note  Patient: Joyce Carpenter  Procedure(s) Performed: Procedure(s) (LRB): SAVORY DILATION (Left)  Patient Location: PACU  Anesthesia Type: MAC  Level of Consciousness: awake and alert   Airway and Oxygen Therapy: Patient Spontanous Breathing  Post-op Pain: mild  Post-op Assessment: Post-op Vital signs reviewed, Patient's Cardiovascular Status Stable, Respiratory Function Stable, Patent Airway and No signs of Nausea or vomiting  Last Vitals:  Filed Vitals:   09/09/14 1115  BP: 128/51  Pulse: 70  Temp:   Resp: 21    Post-op Vital Signs: stable   Complications: No apparent anesthesia complications

## 2014-09-09 NOTE — Addendum Note (Signed)
Addendum  created 09/09/14 1232 by Carmela Rima, CRNA   Modules edited: Anesthesia Blocks and Procedures, Anesthesia Medication Administration, Clinical Notes   Clinical Notes:  File: [id number]

## 2014-09-09 NOTE — Op Note (Signed)
[facility] [address] [place], [address]   ENDOSCOPY PROCEDURE REPORT  PATIENT: Marguerite, Barba  MR#: [id number] BIRTHDATE: 1930-07-05 , 84  yrs. old GENDER: female ENDOSCOPIST: Dorena Cookey, MD REFERRED BY: PROCEDURE DATE:  10-09-14 PROCEDURE: ASA CLASS: INDICATIONS:  dysphagia with esophageal stricture suggested on barium swallow MEDICATIONS:  MA C TOPICAL ANESTHETIC: Cetacaine spray  DESCRIPTION OF PROCEDURE: After the risks benefits and alternatives of the procedure were thoroughly explained, informed consent was obtained.  The PENTAX GASTOROSCOPE [id number] endoscope was introduced through the mouth and advanced to the second portion of the duodenum , Without limitations.  The instrument was slowly withdrawn as the mucosa was fully examined. Estimated blood loss is zero unless otherwise noted in this procedure report.    esophagus: Normal stomach: Normal with normal retroflexed view of the cardia. duodenum: Normal Due to the dysphagia and findings on barium swallow a guidewire was placed in the duodenum and the scope withdrawn. A 17 mm Savary dilator was passed over the guidewire with minimal resistance and no blood seen on withdrawal.         The scope was then withdrawn from the patient and the procedure completed.  COMPLICATIONS: There were no immediate complications.  ENDOSCOPIC IMPRESSION: no endoscopic abnormality to correlate with barium swallow findings and dysphagia, raising the question of achalasia, status post empiric dilatation.  RECOMMENDATIONS: Will advance diet and observe response to dilatation. If does not improve we'll pursue esophageal manometry.  REPEAT EXAM:  eSigned:  Dorena Cookey, MD 2014-10-09 11:00 AM    CC:  CPT CODES: ICD CODES:  The ICD and CPT codes recommended by this software are interpretations from the data that the clinical staff has captured with the software.  The verification of the  translation of this report to the ICD and CPT codes and modifiers is the sole responsibility of the health care institution and practicing physician where this report was generated.  [employer] will not be held responsible for the validity of the ICD and CPT codes included on this report.  AMA assumes no liability for data contained or not contained herein. CPT is a Publishing rights manager of the [employer].

## 2014-09-09 NOTE — Anesthesia Preprocedure Evaluation (Addendum)
Anesthesia Evaluation  Patient identified by MRN, date of birth, ID band Patient awake    Reviewed: Allergy & Precautions, H&P , NPO status , Patient's Chart, lab work & pertinent test results  History of Anesthesia Complications Negative for: history of anesthetic complications  Airway Mallampati: III  TM Distance: >3 FB Neck ROM: Full    Dental no notable dental hx. (+) Dental Advisory Given, Poor Dentition   Pulmonary shortness of breath and with exertion,  breath sounds clear to auscultation  Pulmonary exam normal       Cardiovascular hypertension, Pt. on medications Normal cardiovascular examRhythm:Regular Rate:Normal  Signed off by cardiology, presented to atypical chest pain, normal echo: Left ventricle: The cavity size was normal. Wall thickness was increased in a pattern of moderate LVH. Systolic function was normal. The estimated ejection fraction was in the range of 60% to 65%. Wall motion was normal; there were no regional wall motion abnormalities. Doppler parameters are consistent with abnormal left ventricular relaxation (grade 1 diastolic dysfunction). - Right atrium: The atrium was mildly dilated. - Pericardium, extracardiac: A trivial pericardial effusion was identified.    Neuro/Psych TIAnegative psych ROS   GI/Hepatic negative GI ROS, Neg liver ROS,   Endo/Other  negative endocrine ROS  Renal/GU CRFRenal disease  negative genitourinary   Musculoskeletal negative musculoskeletal ROS (+)   Abdominal   Peds negative pediatric ROS (+)  Hematology negative hematology ROS (+) anemia ,   Anesthesia Other Findings   Reproductive/Obstetrics negative OB ROS                          Anesthesia Physical Anesthesia Plan  ASA: II  Anesthesia Plan: MAC   Post-op Pain Management:    Induction:   Airway Management Planned: Natural Airway and Nasal  Cannula  Additional Equipment:   Intra-op Plan:   Post-operative Plan:   Informed Consent: I have reviewed the patients History and Physical, chart, labs and discussed the procedure including the risks, benefits and alternatives for the proposed anesthesia with the patient or authorized representative who has indicated his/her understanding and acceptance.   Dental advisory given and Dental Advisory Given  Plan Discussed with: CRNA and Anesthesiologist  Anesthesia Plan Comments:        Anesthesia Quick Evaluation

## 2014-09-09 NOTE — Evaluation (Signed)
Physical Therapy Evaluation Patient Details Name: Joyce Carpenter MRN: [id number] DOB: 03-12-1930 Today's Date: 09/09/2014   History of Present Illness  Joyce Carpenter is an 81 y.o. female with hx of HTN, mesh ventral heniorrhaphy, lives at home with her niece, presented to ER with vague complaints including difficulty swallowing, s/p EGD this morning.  Clinical Impression  Patient presents with decreased independence with mobility over the last month has not been ambulatory.  Feel she will benefit from skilled PT in the acute setting to allow return home following SNF rehab stay.    Follow Up Recommendations SNF    Equipment Recommendations  None recommended by PT    Recommendations for Other Services       Precautions / Restrictions Precautions Precautions: Fall      Mobility  Bed Mobility Overal bed mobility: Needs Assistance Bed Mobility: Supine to Sit;Sit to Supine     Supine to sit: Mod assist;HOB elevated Sit to supine: Max assist   General bed mobility comments: increased time, pt able to lift upper body upright, but difficulty moving lower trunk; to supine pt at edge of bed, attempted to reciprocal scoot with assist, but unable to move so assisted to supine legs and trunk  Transfers Overall transfer level: Needs assistance Equipment used: Rolling walker (2 wheeled) Transfers: Sit to/from [employer] Sit to Stand: Mod assist Stand pivot transfers: Mod assist       General transfer comment: difficulty pivoting feet, cues to lift legs more with improved clearance, pt SOB with all mobility HR in 90's  Ambulation/Gait             General Gait Details: transferred bed<>BSC only due to fatigue and unsafe at this time to ambulate  Stairs            Wheelchair Mobility    Modified Rankin (Stroke Patients Only)       Balance Overall balance assessment: Needs assistance   Sitting balance-Leahy Scale: Fair     Standing  balance support: Bilateral upper extremity supported Standing balance-Leahy Scale: Poor Standing balance comment: assist with walker for balance                             Pertinent Vitals/Pain Pain Assessment: Faces Faces Pain Scale: Hurts little more Pain Location: stomach, nausea Pain Intervention(s): Monitored during session    Home Living Family/patient expects to be discharged to:: Private residence Living Arrangements: Other relatives (sister, neice and nephew) Available Help at Discharge: Family;Available PRN/intermittently Type of Home: House Home Access: Ramped entrance     Home Layout: One level Home Equipment: Emergency planning/management officer - 2 wheels;Cane - quad;Bedside commode      Prior Function Level of Independence: Needs assistance         Comments: reports has not been walking for about a month, concerned regarding having adequate care at home     Hand Dominance   Dominant Hand: Right    Extremity/Trunk Assessment   Upper Extremity Assessment: RUE deficits/detail;LUE deficits/detail RUE Deficits / Details: AAROM shoulder flexion limited with palpable crepitus, strength elbow flex/ext 4/5     LUE Deficits / Details: AAROM shoulder flexion limited with palpable crepitus, strength elbow flex/ext 4/5   Lower Extremity Assessment: RLE deficits/detail;LLE deficits/detail RLE Deficits / Details: AAROM grossly WFL, strength hip flex 2/5, knee extension 3/5, ankle DF 2/5 LLE Deficits / Details: AROM grossly WFL, strength hip flexion 3/5, knee extension 4+/5, ankle  DF 4/5  Cervical / Trunk Assessment: Kyphotic  Communication   Communication: HOH  Cognition Arousal/Alertness: Awake/alert Behavior During Therapy: WFL for tasks assessed/performed Overall Cognitive Status: Within Functional Limits for tasks assessed                      General Comments General comments (skin integrity, edema, etc.): toileted on BSC, unable to perform hygiene so  assisted in standing    Exercises        Assessment/Plan    PT Assessment Patient needs continued PT services  PT Diagnosis Difficulty walking;Generalized weakness   PT Problem List Decreased strength;Impaired sensation;Decreased activity tolerance;Decreased balance;Decreased mobility;Decreased knowledge of use of DME  PT Treatment Interventions DME instruction;Balance training;Gait training;Functional mobility training;Patient/family education;Therapeutic activities;Therapeutic exercise   PT Goals (Current goals can be found in the Care Plan section) Acute Rehab PT Goals Patient Stated Goal: To get stronger PT Goal Formulation: With patient Time For Goal Achievement: 09/16/14 Potential to Achieve Goals: Good    Frequency Min 3X/week   Barriers to discharge Decreased caregiver support reports does not have 24 hour capable assist at home, sister and neice work and nephew is "in and out"    Co-evaluation               End of Session Equipment Utilized During Treatment: Gait belt Activity Tolerance: Patient limited by fatigue Patient left: in bed;with call bell/phone within reach;with bed alarm set      Functional Assessment Tool Used: Clinical Judgement Functional Limitation: Mobility: Walking and moving around Mobility: Walking and Moving Around Current Status [phone]): At least 40 percent but less than 60 percent impaired, limited or restricted Mobility: Walking and Moving Around Goal Status [phone]): At least 20 percent but less than 40 percent impaired, limited or restricted    Time: 1430-1453 PT Time Calculation (min) (ACUTE ONLY): 23 min   Charges:   PT Evaluation $Initial PT Evaluation Tier I: 1 Procedure PT Treatments $Therapeutic Activity: 8-22 mins   PT G Codes:   PT G-Codes **NOT FOR INPATIENT CLASS** Functional Assessment Tool Used: Clinical Judgement Functional Limitation: Mobility: Walking and moving around Mobility: Walking and Moving Around  Current Status (U9811): At least 40 percent but less than 60 percent impaired, limited or restricted Mobility: Walking and Moving Around Goal Status [phone]): At least 20 percent but less than 40 percent impaired, limited or restricted    [facility] 09/09/2014, 3:12 PM [place],  [phone] 09/09/2014

## 2014-09-10 ENCOUNTER — Encounter (HOSPITAL_COMMUNITY): Payer: Self-pay | Admitting: Gastroenterology

## 2014-09-10 DIAGNOSIS — R7989 Other specified abnormal findings of blood chemistry: Secondary | ICD-10-CM | POA: Diagnosis not present

## 2014-09-10 DIAGNOSIS — E785 Hyperlipidemia, unspecified: Secondary | ICD-10-CM | POA: Diagnosis not present

## 2014-09-10 DIAGNOSIS — I10 Essential (primary) hypertension: Secondary | ICD-10-CM | POA: Diagnosis not present

## 2014-09-10 DIAGNOSIS — R1310 Dysphagia, unspecified: Secondary | ICD-10-CM | POA: Diagnosis not present

## 2014-09-10 MED ORDER — HYDRALAZINE HCL 50 MG PO TABS: 50.0000 mg | ORAL_TABLET | Freq: Three times a day (TID) | ORAL | Status: AC

## 2014-09-10 MED ORDER — PANTOPRAZOLE SODIUM 40 MG PO TBEC: 40.0000 mg | DELAYED_RELEASE_TABLET | Freq: Two times a day (BID) | ORAL | Status: AC

## 2014-09-10 MED ORDER — ACETAMINOPHEN 325 MG PO TABS: 650.0000 mg | ORAL_TABLET | Freq: Four times a day (QID) | ORAL | Status: DC | PRN

## 2014-09-10 MED ORDER — FLUCONAZOLE 100 MG PO TABS: 100.0000 mg | ORAL_TABLET | Freq: Every day | ORAL | Status: DC

## 2014-09-10 MED ORDER — ONDANSETRON HCL 4 MG PO TABS: 4.0000 mg | ORAL_TABLET | Freq: Four times a day (QID) | ORAL | Status: DC | PRN

## 2014-09-10 NOTE — Progress Notes (Signed)
Pt a/o forgetful HOH, no c/o pain, pt resting comfortable, VSS, pt stable

## 2014-09-10 NOTE — Discharge Instructions (Signed)

## 2014-09-10 NOTE — Discharge Summary (Signed)
Pt is Discharged to SNIF ([facility]), report provided to the receiving nurse on the Unit, IV taken out and Tele-monitor DC, pt is aware about the situation that she is leaving for [facility], plan of care discussed with patient.

## 2014-09-10 NOTE — Clinical Social Work Placement (Signed)
   CLINICAL SOCIAL WORK PLACEMENT  NOTE  Date:  09/10/2014  Patient Details  Name: Joyce Carpenter MRN: [id number] Date of Birth: 02/06/1930  Clinical Social Work is seeking post-discharge placement for this patient at the Skilled  Nursing Facility level of care (*CSW will initial, date and re-position this form in  chart as items are completed):  Yes   Patient/family provided with [facility] Clinical Social Work Department's list of facilities offering this level of care within the geographic area requested by the patient (or if unable, by the patient's family).  Yes   Patient/family informed of their freedom to choose among providers that offer the needed level of care, that participate in Medicare, Medicaid or managed care program needed by the patient, have an available bed and are willing to accept the patient.  Yes   Patient/family informed of [facility]'s ownership interest in [facility] and [facility], as well as of the fact that they are under no obligation to receive care at these facilities.  PASRR submitted to EDS on       PASRR number received on       Existing PASRR number confirmed on 09/10/14     FL2 transmitted to all facilities in geographic area requested by pt/family on 09/10/14     FL2 transmitted to all facilities within larger geographic area on       Patient informed that his/her managed care company has contracts with or will negotiate with certain facilities, including the following:        Yes   Patient/family informed of bed offers received.  Patient chooses bed at [facility]     Physician recommends and patient chooses bed at      Patient to be transferred to [facility] at [place] on 09/10/14.  Patient to be transferred to facility by Ambulance Sharin Mons)     Patient family notified on 09/10/14 of transfer.  Name of family member notified:  Sister, Audley Hose by CSW and patient's nurse.     PHYSICIAN        Additional Comment:    _______________________________________________ Cristobal Goldmann, LCSW 09/10/2014, 3:34 PM

## 2014-09-10 NOTE — Clinical Social Work Note (Signed)
Clinical Social Work Assessment  Patient Details  Name: Joyce Carpenter MRN: [id number] Date of Birth: 03-11-1930  Date of referral:  09/10/14               Reason for consult:  Facility Placement                Permission sought to share information with:  Family Supports Permission granted to share information::  Yes, Verbal Permission Granted  Name::     Joyce Carpenter  Agency::     Relationship::  Sister  Contact Information:  [phone]  Housing/Transportation Living arrangements for the past 2 months:  Single Family Home Source of Information:  Patient Patient Interpreter Needed:  None Criminal Activity/Legal Involvement Pertinent to Current Situation/Hospitalization:  No - Comment as needed Significant Relationships:  Siblings Lives with:  Siblings Do you feel safe going back to the place where you live?  Yes Need for family participation in patient care:  Yes (Comment)  Care giving concerns:  Patient understands the need for ST rehab in a facility as her sister, Merdis Delay works during the day.     Social Worker assessment / plan: CSW received consult today regarding SNF placement. Talked with patient about short-term rehab in a facility and she is in agreement. Patient provided CSW with her facility preference.  CSW talked later with patient and [facility] has no beds available. [facility] made a bed offer and patient is agreeable to going to this facility.  Employment status:  Retired Database administrator, [employer] [place] PT Recommendations:  Skilled Nursing Facility Information / Referral to community resources:  Other (Comment Required) (Not requested or needed at this time)  Patient/Family's Response to care:  No comments made by patient regarding care at hospital.  Patient/Family's Understanding of and Emotional Response to Diagnosis, Current Treatment, and Prognosis:  Note discussed.  Emotional Assessment Appearance:  Appears stated  age Attitude/Demeanor/Rapport:  Other (Appropriate) Affect (typically observed):  Appropriate Orientation:  Oriented to Self, Oriented to Place, Oriented to  Time, Oriented to Situation Alcohol / Substance use:  Never Used Psych involvement (Current and /or in the community):  No (Comment)  Discharge Needs  Concerns to be addressed:  Discharge Planning Concerns Readmission within the last 30 days:  No Current discharge risk:  None Barriers to Discharge:  No Barriers Identified   Cristobal Goldmann, LCSW 09/10/2014, 3:30 PM

## 2014-09-10 NOTE — Discharge Summary (Signed)
Physician Discharge Summary  Joyce Carpenter ZOX:[id number] DOB: October 13, 1930 DOA: 09/06/2014  PCP: [name], MD  Admit date: 09/06/2014 Discharge date: 09/10/2014  Recommendations for Outpatient Follow-up:  1. Take fluconazole for 7 days on discharge. 2. Continue Hydralazine and Norvasc for blood pressure. If blood pressure uncontrolled then she may need clonidine which she used to take before but blood pressure in hosiptal was controlled without clonidine. She was on clonidine 0.1 mg BID.  Discharge Diagnoses:  Principal Problem:   Dysphagia Active Problems:   Hypertension   Acute renal failure   Essential hypertension   Elevated troponin   Absolute anemia   Atypical chest pain    Discharge Condition: stable  Diet recommendation: as tolerated soft diet   History of present illness:  81 y.o. female with past medical history of hypertension, dyslipidemia, iron deficiency anemia, lives at home with her niece who presented with difficulty swallowing for some time, solids more so than liquids. She also complained of some chest tightness without palpitations. On admission, patient was found to have mild elevation in troponin, 0.19, no acute ischemic changes on 12-lead EKG. Her blood work was further significant for creatinine of 1.6, baseline creatinine is 1.4. Cardiology has seen the patient in consultation.  Further workup included a barium swallow to evaluate for dysphagia to solids and liquids. Barium swallow demonstrated benign stricture or possible achalasia versus neoplasm. GI has seen the patient in consultation. Patient underwent upper endoscopy 09/09/2014. There were no endoscopic abnormalities to correlate with barium swallow findings and dysphagia raising the question of achalasia, status post empiric dilatation.   Hospital Course:    Assessment/Plan:    Principal Problem: Dysphagia - Dysphasia to solids more than liquids. - Patient underwent barium  swallow which is suggestive of benign stricture or possibly achalasia however neoplasm is not ruled out - GI has seen the patient in consultation - Patient underwent EGD 09/09/2014 which demonstrated no endoscopic abnormalities to correlate with barium swallow findings and dysphagia raising the question of achalasia, status post empiric dilatation. - Patient tolerates soft food this morning - Continue Protonix 40 mg twice daily - Continue fluconazole for 7 days on discharge  Active Problems: Atypical Chest Pain / Troponin level elevated  - Patient presented with very vague symptoms  - Troponin level stable at 0.18 - 0.19 - Cardiology has seen the patient in consultation  - 2-D echo on this admission demonstrated ejection fraction of 60% with grade 1 diastolic dysfunction. - Per cardiology patient is not a candidate for invasive evaluation given age, anemia and chronic renal failure  Essential hypertension - Continue Norvasc, hydralazine. Blood pressure controlled while patient on these medications.  Dyslipidemia - Continue statin therapy  Chronic kidney disease, stage III - Baseline creatinine is 1.4 and on this admission 1.6 - Lisinopril/hctz stopped as it could have contributed to worsening renal function   Anemia of chronic disease - Likely secondary to anemia of chronic kidney disease - Hemoglobin is 8.9, stable - No current indications for transfusion  Generalized weakness - Per physical therapy, recommendation for skilled nursing facility placement. - Appreciate social work assisting with discharge planning   DVT Prophylaxis  - Heparin subQ ordered while patient in hospital   Code Status: Full.  Family Communication: Family not at the bedside this am    IV access:  Peripheral IV  Procedures and diagnostic studies:   Dg Chest 2 View 09/06/2014 Chronic cardiomegaly and trace right pleural effusion. Electronically Signed By: Kathrynn Ducking.D.  On: 09/06/2014 02:29   Dg Esophagus 09/07/2014 1. Distal esophageal stricture/narrowing, not distended beyond 9 mm. Endoscopy may be warranted for further characterization. The esophagus proximal to this is slightly dilated, and achalasia is not excluded. 2. Mild leftward deviation of the esophagus in the lower neck, cause uncertain but possibly from a goiter. 3. Mild nonspecific esophageal dysmotility disorder. Electronically Signed By: Gaylyn Rong M.D. On: 09/07/2014 11:55   Medical Consultants:  Cardiology Gastroenterology, Dr. Dorena Cookey  Other Consultants:  Physical therapy  IAnti-Infectives:   None      Signed:  Manson Passey, MD  [facility] 09/10/2014, 11:22 AM  Pager #: [phone]  Time spent in minutes: more than 30 minutes   Discharge Exam: Filed Vitals:   09/10/14 1000  BP: 143/47  Pulse: 70  Temp: 98 F (36.7 C)  Resp:    Filed Vitals:   09/09/14 1420 09/09/14 2015 09/10/14 0420 09/10/14 1000  BP: 127/92 137/56 153/52 143/47  Pulse: 71 70 74 70  Temp: 99 F (37.2 C) 99.6 F (37.6 C) 98.4 F (36.9 C) 98 F (36.7 C)  TempSrc: Oral Oral Oral Oral  Resp: Height:      Weight:   87.9 kg (193 lb 12.6 oz)   SpO2: 97% 98% 97% 100%    General: Pt is alert, follows commands appropriately, not in acute distress Cardiovascular: Regular rate and rhythm, S1/S2 appreciated  Respiratory: Clear to auscultation bilaterally, no wheezing, no crackles, no rhonchi Abdominal: Soft, non tender, non distended, bowel sounds +, no guarding Extremities: no edema, no cyanosis, pulses palpable bilaterally DP and PT Neuro: Grossly nonfocal  Discharge Instructions  Discharge Instructions    Call MD for:  difficulty breathing, headache or visual disturbances    Complete by:  As directed      Call MD for:  persistant nausea and vomiting    Complete by:  As directed      Call MD for:  severe uncontrolled pain    Complete by:  As  directed      Diet - low sodium heart healthy    Complete by:  As directed      Discharge instructions    Complete by:  As directed   1. Take fluconazole for 7 days on discharge. 2. Continue Hydralazine and Norvasc for blood pressure. If blood pressure uncontrolled then she may need clonidine which she used to take before but blood pressure in hosiptal was controlled without clonidine. She was on clonidine 0.1 mg BID.     Increase activity slowly    Complete by:  As directed             Medication List    STOP taking these medications        cloNIDine 0.1 MG tablet  Commonly known as:  CATAPRES     HYDROcodone-acetaminophen 5-325 MG per tablet  Commonly known as:  NORCO/VICODIN     lisinopril-hydrochlorothiazide 20-12.5 MG per tablet  Commonly known as:  PRINZIDE,ZESTORETIC     naproxen 500 MG tablet  Commonly known as:  NAPROSYN     oxycodone 5 MG capsule  Commonly known as:  OXY-IR      TAKE these medications        acetaminophen 325 MG tablet  Commonly known as:  TYLENOL  Take 2 tablets (650 mg total) by mouth every 6 (six) hours as needed for mild pain (or Fever >/= 101).     amLODipine 5 MG tablet  Commonly known as:  NORVASC  Take 5 mg by mouth daily.     aspirin EC 81 MG tablet  Take 81 mg by mouth every other day.     atorvastatin 20 MG tablet  Commonly known as:  LIPITOR  Take 20 mg by mouth daily.     [employer] SPECTRAVITE ADULT 50+ Tabs  Take 1 tablet by mouth daily.     multivitamin with minerals tablet  Take 1 tablet by mouth daily.     ferrous sulfate 325 (65 FE) MG tablet  Take 325 mg by mouth daily with breakfast.     fluconazole 100 MG tablet  Commonly known as:  DIFLUCAN  Take 1 tablet (100 mg total) by mouth daily.     hydrALAZINE 50 MG tablet  Commonly known as:  APRESOLINE  Take 1 tablet (50 mg total) by mouth every 8 (eight) hours.     ondansetron 4 MG tablet  Commonly known as:  ZOFRAN  Take 1 tablet (4 mg total) by mouth every 6  (six) hours as needed for nausea.     pantoprazole 40 MG tablet  Commonly known as:  PROTONIX  Take 1 tablet (40 mg total) by mouth 2 (two) times daily.     senna 8.6 MG tablet  Commonly known as:  SENOKOT  Take 1 tablet by mouth daily.           Follow-up Information    Follow up with DONDIEGO,RICHARD M, MD. Schedule an appointment as soon as possible for a visit in 1 week.   Specialty:  Internal Medicine   Why:  Follow up appt after recent hospitalization   Contact information:   [address] [place] [address] [phone]        The results of significant diagnostics from this hospitalization (including imaging, microbiology, ancillary and laboratory) are listed below for reference.    Significant Diagnostic Studies: Dg Chest 2 View  09/06/2014   CLINICAL DATA:  Chest pain and shortness of breath  EXAM: CHEST  2 VIEW  COMPARISON:  08/25/2014  FINDINGS: Chronic cardiomegaly.  Negative aortic and hilar contours.  Trace right pleural effusion. No pneumonia or edema. No pneumothorax.  Severe bilateral glenohumeral osteoarthritis with chronic rotator cuff tears and AC joint destruction.  IMPRESSION: Chronic cardiomegaly and trace right pleural effusion.   Electronically Signed   By: Marnee Spring M.D.   On: 09/06/2014 02:29   Dg Esophagus  09/07/2014   CLINICAL DATA:  Chest pain and abdominal pain.  EXAM: ESOPHOGRAM/BARIUM SWALLOW  TECHNIQUE: Single contrast examination was performed using  thin barium.  FLUOROSCOPY TIME:  Radiation Exposure Index (as provided by the fluoroscopic device):  If the device does not provide the exposure index:  Fluoroscopy Time:  1 minute, 42 seconds.  Number of Acquired Images:  0  COMPARISON:  09/06/2014  FINDINGS: Mild leftward deviation of the esophagus in the lower neck, significance uncertain.  Primary peristaltic waves were disrupted on 2 out of 4 swallows.  Mildly dilated esophagus. Narrowed distal esophagus at the gastroesophageal  junction, maximum distension only to 9 mm. A 13 mm barium tablet extended down to the distal esophagus but subsequently would not pass, simply moving proximally and distally in the esophagus.  Occasional tertiary contractions noted.  IMPRESSION: 1. Distal esophageal stricture/narrowing, not distended beyond 9 mm. Endoscopy may be warranted for further characterization. The esophagus proximal to this is slightly dilated, and achalasia is not excluded. 2. Mild leftward deviation of the esophagus  in the lower neck, cause uncertain but possibly from a goiter. 3. Mild nonspecific esophageal dysmotility disorder.   Electronically Signed   By: Gaylyn Rong M.D.   On: 09/07/2014 11:55   Dg Abd Acute W/chest  08/25/2014   CLINICAL DATA:  Acute onset of constipation for 1 week. Abdominal pain and distention. Initial encounter.  EXAM: DG ABDOMEN ACUTE W/ 1V CHEST  COMPARISON:  CT of the abdomen and pelvis, and chest and abdominal radiographs performed 03/20/2014  FINDINGS: The lungs are well-aerated. Mild vascular congestion is noted. There is no evidence of focal opacification, pleural effusion or pneumothorax. The cardiomediastinal silhouette is borderline enlarged.  The visualized bowel gas pattern is unremarkable. Scattered stool and air are seen within the colon; there is no evidence of small bowel dilatation to suggest obstruction. No free intra-abdominal air is identified on the provided upright view.  No acute osseous abnormalities are seen; the sacroiliac joints are unremarkable in appearance. There is chronic superior subluxation of both humeral heads, with underlying partial resorption of the distal clavicles and acromion bilaterally.  IMPRESSION: 1. Unremarkable bowel gas pattern; no free intra-abdominal air seen. Moderate amount of stool noted in the colon. 2. Mild vascular congestion and borderline cardiomegaly. Lungs remain grossly clear.   Electronically Signed   By: Roanna Raider M.D.   On: 08/25/2014  06:32    Microbiology: No results found for this or any previous visit (from the past 240 hour(s)).   Labs: Basic Metabolic Panel:  Recent Labs Lab 09/06/14 0212 09/07/14 0310  NA 141 140  K 4.9 4.9  CL 109 108  CO2 26 24  GLUCOSE 98 105*  BUN 47* 39*  CREATININE 1.80* 1.65*  CALCIUM 8.4* 8.7*   Liver Function Tests:  Recent Labs Lab 09/07/14 0310  AST 23  ALT 16  ALKPHOS 64  BILITOT 0.3  PROT 5.1*  ALBUMIN 2.4*   No results for input(s): LIPASE, AMYLASE in the last 168 hours. No results for input(s): AMMONIA in the last 168 hours. CBC:  Recent Labs Lab 09/06/14 0212 09/07/14 0310  WBC 5.0 4.9  NEUTROABS 2.6  --   HGB 8.1* 8.9*  HCT 24.4* 27.2*  MCV 100.4* 99.3  PLT 241 278   Cardiac Enzymes:  Recent Labs Lab 09/06/14 0212 09/06/14 0434 09/06/14 1043  TROPONINI 0.19* 0.19* 0.18*   BNP: BNP (last 3 results)  Recent Labs  09/06/14 0218  BNP 327.0*    ProBNP (last 3 results)  Recent Labs  10/08/13 0216  PROBNP 531.4*    CBG:  Recent Labs Lab 09/06/14 2345 09/07/14 0607  GLUCAP 90 88

## 2014-09-17 ENCOUNTER — Encounter: Payer: Self-pay | Admitting: Orthopedic Surgery

## 2014-09-17 ENCOUNTER — Ambulatory Visit (INDEPENDENT_AMBULATORY_CARE_PROVIDER_SITE_OTHER): Payer: Medicare Other | Admitting: Orthopedic Surgery

## 2014-09-17 VITALS — BP 129/99 | Ht 64.0 in | Wt 193.8 lb

## 2014-09-17 DIAGNOSIS — R29898 Other symptoms and signs involving the musculoskeletal system: Secondary | ICD-10-CM

## 2014-09-17 NOTE — Progress Notes (Signed)
Chief Complaint  Patient presents with  . Leg Problem    LEG PROBLEM, ref. DONDIEGO    History of present illness the patient recently discharged from the hospital for evaluation of dysphagia presented to our office for bilateral knee pain but actually complains of aching left leg with stiffness weakness and numbness primarily the right lower extremity. No previous treatment. X-rays of both knees show bilateral degenerative arthritis. The patient is actually bed to chair after recent hospital stay and can't really move her right leg very well. She doesn't complain of knee pain.  Review of systems constipation shortness of breath breathing issues burning pain in the legs weakness and lightheadedness.  Past Medical History  Diagnosis Date  . Hypertension   . Shortness of breath dyspnea     BP 129/99 mmHg  Ht  (1.626 m)  Wt 193 lb 12.5 oz (87.898 kg)  BMI 33.25 kg/m2 Appearance is normal she has some severe peripheral edema bilateral has planovalgus rigid feet she is oriented but still has some speaking issues mood is flat gait is wheelchair-bound  She can barely move the left leg but she does have hip flexion knee extension knee flexion. She cannot really move the right leg at all from the hip or knee and it is flaccid and weak  Distal pulses are not palpable but the foot is warm to touch the skin is dry with calluses over the navicular bilaterally.  Imaging studies included bilateral knees the right is worse than the left) has valgus knee and the left is arthritic as well  Impression Encounter Diagnosis  Name Primary?  . Right leg weakness Yes    Recommend neurology follow-up. We can't really do much with her at this point.

## 2014-09-17 NOTE — Patient Instructions (Signed)
Will refer to neurology

## 2014-09-23 ENCOUNTER — Encounter (HOSPITAL_COMMUNITY): Payer: Self-pay

## 2014-09-23 ENCOUNTER — Encounter (HOSPITAL_COMMUNITY)
Admission: RE | Admit: 2014-09-23 | Discharge: 2014-09-23 | Disposition: A | Discharge status: Home / Self Care | Payer: Medicare Other | Source: Ambulatory Visit | Attending: Internal Medicine | Admitting: Internal Medicine

## 2014-09-23 DIAGNOSIS — D649 Anemia, unspecified: Secondary | ICD-10-CM | POA: Diagnosis not present

## 2014-09-23 LAB — PREPARE RBC (CROSSMATCH)

## 2014-09-23 LAB — HEMOGLOBIN AND HEMATOCRIT, BLOOD
HCT: 25.6 % — ABNORMAL LOW (ref 36.0–46.0)
HEMOGLOBIN: 8.2 g/dL — AB (ref 12.0–15.0)

## 2014-09-23 NOTE — Discharge Instructions (Signed)

## 2014-09-23 NOTE — Progress Notes (Addendum)
Results for HARBOUR, NORDMEYER (MRN [id number]) as of 09/23/2014 15:09  Information sent to [facility] requiring this lab, MD to verify if blood is needed.   Ref. Range 09/09/2014 11:00 09/23/2014 14:30  Hemoglobin Latest Ref Range: 12.0-15.0 g/dL  8.2 (L)  HCT Latest Ref Range: 36.0-46.0 %  25.6 (L)   Received called from [facility], Kathe Becton NP to discontinue the order to transfuse 2 units of PC  In the am due to pt HGB/ HCT 8.2/ 25.6.  Lab called to cancel order.

## 2014-09-24 ENCOUNTER — Encounter (HOSPITAL_COMMUNITY): Payer: Medicare Other

## 2014-09-25 ENCOUNTER — Other Ambulatory Visit: Payer: Self-pay | Admitting: *Deleted

## 2014-09-25 ENCOUNTER — Telehealth: Payer: Self-pay | Admitting: *Deleted

## 2014-09-25 DIAGNOSIS — R29898 Other symptoms and signs involving the musculoskeletal system: Secondary | ICD-10-CM

## 2014-09-25 NOTE — Telephone Encounter (Signed)
REFERRAL FAXED TO DR DOONQUAH 

## 2014-09-28 LAB — TYPE AND SCREEN
ABO/RH(D): O POS
ANTIBODY SCREEN: NEGATIVE
UNIT DIVISION: 0
Unit division: 0

## 2014-09-29 ENCOUNTER — Other Ambulatory Visit (HOSPITAL_COMMUNITY): Payer: Self-pay | Admitting: Internal Medicine

## 2014-09-29 ENCOUNTER — Ambulatory Visit (HOSPITAL_COMMUNITY)
Admission: RE | Admit: 2014-09-29 | Discharge: 2014-09-29 | Disposition: A | Discharge status: Home / Self Care | Payer: Medicare Other | Source: Ambulatory Visit | Attending: Internal Medicine | Admitting: Internal Medicine

## 2014-09-29 DIAGNOSIS — R0602 Shortness of breath: Secondary | ICD-10-CM | POA: Insufficient documentation

## 2014-09-29 DIAGNOSIS — R059 Cough, unspecified: Secondary | ICD-10-CM

## 2014-09-29 DIAGNOSIS — R05 Cough: Secondary | ICD-10-CM | POA: Diagnosis not present

## 2014-09-29 DIAGNOSIS — I509 Heart failure, unspecified: Secondary | ICD-10-CM | POA: Insufficient documentation

## 2014-10-01 ENCOUNTER — Encounter (HOSPITAL_COMMUNITY)
Admission: RE | Admit: 2014-10-01 | Discharge: 2014-10-01 | Disposition: A | Discharge status: Home / Self Care | Payer: Medicare Other | Source: Ambulatory Visit | Attending: Internal Medicine | Admitting: Internal Medicine

## 2014-10-01 DIAGNOSIS — D649 Anemia, unspecified: Secondary | ICD-10-CM | POA: Diagnosis not present

## 2014-10-01 LAB — HEMOGLOBIN AND HEMATOCRIT, BLOOD
HCT: 25.1 % — ABNORMAL LOW (ref 36.0–46.0)
HEMOGLOBIN: 8.2 g/dL — AB (ref 12.0–15.0)

## 2014-10-01 NOTE — Progress Notes (Signed)
Results for LEMYA, GREENWELL (MRN [id number]) as of 10/01/2014 13:35  Ref. Range 10/01/2014 13:00  Hemoglobin Latest Ref Range: 12.0-15.0 g/dL 8.2 (L)  HCT Latest Ref Range: 36.0-46.0 % 25.1 (L)

## 2014-10-01 NOTE — Progress Notes (Signed)
Called [facility] and informed the nurse hemoglobin was good. Transfusion order cancelled.

## 2014-10-04 LAB — TYPE AND SCREEN
ABO/RH(D): O POS
ANTIBODY SCREEN: NEGATIVE
UNIT DIVISION: 0
Unit division: 0

## 2014-10-05 ENCOUNTER — Ambulatory Visit (HOSPITAL_COMMUNITY)
Admission: RE | Admit: 2014-10-05 | Discharge: 2014-10-05 | Disposition: A | Discharge status: Home / Self Care | Payer: Medicare Other | Source: Ambulatory Visit | Attending: Internal Medicine | Admitting: Internal Medicine

## 2014-10-05 ENCOUNTER — Encounter (HOSPITAL_COMMUNITY)
Admission: RE | Admit: 2014-10-05 | Discharge: 2014-10-05 | Disposition: A | Discharge status: Home / Self Care | Payer: Medicare Other | Source: Ambulatory Visit | Attending: Internal Medicine | Admitting: Internal Medicine

## 2014-10-05 ENCOUNTER — Other Ambulatory Visit (HOSPITAL_COMMUNITY): Payer: Self-pay | Admitting: Internal Medicine

## 2014-10-05 DIAGNOSIS — R51 Headache: Secondary | ICD-10-CM | POA: Diagnosis present

## 2014-10-05 DIAGNOSIS — S0990XA Unspecified injury of head, initial encounter: Secondary | ICD-10-CM

## 2014-10-05 DIAGNOSIS — X58XXXA Exposure to other specified factors, initial encounter: Secondary | ICD-10-CM | POA: Diagnosis not present

## 2014-10-05 DIAGNOSIS — D649 Anemia, unspecified: Secondary | ICD-10-CM | POA: Diagnosis not present

## 2014-10-05 LAB — PREPARE RBC (CROSSMATCH)

## 2014-10-05 LAB — HEMOGLOBIN AND HEMATOCRIT, BLOOD
HEMATOCRIT: 26.8 % — AB (ref 36.0–46.0)
HEMOGLOBIN: 8.5 g/dL — AB (ref 12.0–15.0)

## 2014-10-05 NOTE — Progress Notes (Signed)
Talked with Costella Hatcher. Notified Mrs. Rademaker appt is in the AM for blood transfusion. Voiced understanding.

## 2014-10-05 NOTE — Progress Notes (Signed)
Results for VALARY, MANAHAN (MRN [id number]) as of 10/05/2014 14:13  Ref. Range 10/05/2014 13:00  Hemoglobin Latest Ref Range: 12.0-15.0 g/dL 8.5 (L)  HCT Latest Ref Range: 36.0-46.0 % 26.8 (L)

## 2014-10-05 NOTE — Progress Notes (Signed)
Hgb 8.5/Hct 26.8. Talked with Costella Hatcher LPN at [facility]. Hgb results given. She gave results to Donata Duff NP. Order given per A [facility] NP to proceed with transfusion.

## 2014-10-06 ENCOUNTER — Encounter (HOSPITAL_COMMUNITY)
Admission: RE | Admit: 2014-10-06 | Discharge: 2014-10-06 | Disposition: A | Discharge status: Home / Self Care | Payer: Medicare Other | Source: Ambulatory Visit | Attending: Internal Medicine | Admitting: Internal Medicine

## 2014-10-06 DIAGNOSIS — D649 Anemia, unspecified: Secondary | ICD-10-CM | POA: Diagnosis not present

## 2014-10-06 LAB — HEMOGLOBIN AND HEMATOCRIT, BLOOD
HEMATOCRIT: 25.6 % — AB (ref 36.0–46.0)
Hemoglobin: 8.1 g/dL — ABNORMAL LOW (ref 12.0–15.0)

## 2014-10-06 NOTE — Progress Notes (Signed)
Spoke with Shelbie Proctor, NP regarding pt.s Hgb of 8.5 & need for blood transfusion.  NP ordered to redraw another H&H prior to transfusing & let Donata Duff, NP know results.  Pt. Came for transfusion with CNA present.  Another H&H was obtained with results of 8.1.  Spoke with Donata Duff, NP regarding results & transfusion was cancelled d/t Hgb greater than 8.  Pt. Left via [facility] transportaion with CNA present.

## 2014-10-07 LAB — TYPE AND SCREEN
ABO/RH(D): O POS
ANTIBODY SCREEN: NEGATIVE
UNIT DIVISION: 0
UNIT DIVISION: 0

## 2014-10-12 ENCOUNTER — Inpatient Hospital Stay (HOSPITAL_COMMUNITY)
Admission: EM | Admit: 2014-10-12 | Discharge: 2014-10-15 | DRG: 292 | Disposition: A | Discharge status: Skilled Nursing Facility | Payer: Medicare Other | Attending: Family Medicine | Admitting: Family Medicine

## 2014-10-12 ENCOUNTER — Inpatient Hospital Stay (HOSPITAL_COMMUNITY): Payer: Medicare Other

## 2014-10-12 ENCOUNTER — Emergency Department (HOSPITAL_COMMUNITY): Payer: Medicare Other

## 2014-10-12 ENCOUNTER — Encounter (HOSPITAL_COMMUNITY): Payer: Self-pay | Admitting: *Deleted

## 2014-10-12 DIAGNOSIS — R0600 Dyspnea, unspecified: Secondary | ICD-10-CM

## 2014-10-12 DIAGNOSIS — I5031 Acute diastolic (congestive) heart failure: Secondary | ICD-10-CM

## 2014-10-12 DIAGNOSIS — G309 Alzheimer's disease, unspecified: Secondary | ICD-10-CM | POA: Diagnosis present

## 2014-10-12 DIAGNOSIS — F0280 Dementia in other diseases classified elsewhere without behavioral disturbance: Secondary | ICD-10-CM | POA: Diagnosis present

## 2014-10-12 DIAGNOSIS — R51 Headache: Secondary | ICD-10-CM | POA: Diagnosis present

## 2014-10-12 DIAGNOSIS — R0602 Shortness of breath: Secondary | ICD-10-CM

## 2014-10-12 DIAGNOSIS — R0902 Hypoxemia: Secondary | ICD-10-CM | POA: Diagnosis present

## 2014-10-12 DIAGNOSIS — D509 Iron deficiency anemia, unspecified: Secondary | ICD-10-CM

## 2014-10-12 DIAGNOSIS — R778 Other specified abnormalities of plasma proteins: Secondary | ICD-10-CM | POA: Diagnosis present

## 2014-10-12 DIAGNOSIS — N179 Acute kidney failure, unspecified: Secondary | ICD-10-CM | POA: Diagnosis present

## 2014-10-12 DIAGNOSIS — E780 Pure hypercholesterolemia: Secondary | ICD-10-CM | POA: Diagnosis present

## 2014-10-12 DIAGNOSIS — I5033 Acute on chronic diastolic (congestive) heart failure: Secondary | ICD-10-CM | POA: Diagnosis present

## 2014-10-12 DIAGNOSIS — I509 Heart failure, unspecified: Secondary | ICD-10-CM

## 2014-10-12 DIAGNOSIS — Z7982 Long term (current) use of aspirin: Secondary | ICD-10-CM

## 2014-10-12 DIAGNOSIS — N170 Acute kidney failure with tubular necrosis: Secondary | ICD-10-CM

## 2014-10-12 DIAGNOSIS — R7989 Other specified abnormal findings of blood chemistry: Secondary | ICD-10-CM

## 2014-10-12 DIAGNOSIS — F419 Anxiety disorder, unspecified: Secondary | ICD-10-CM | POA: Diagnosis present

## 2014-10-12 DIAGNOSIS — D539 Nutritional anemia, unspecified: Secondary | ICD-10-CM | POA: Diagnosis present

## 2014-10-12 DIAGNOSIS — D508 Other iron deficiency anemias: Secondary | ICD-10-CM

## 2014-10-12 DIAGNOSIS — I10 Essential (primary) hypertension: Secondary | ICD-10-CM | POA: Diagnosis present

## 2014-10-12 DIAGNOSIS — N289 Disorder of kidney and ureter, unspecified: Secondary | ICD-10-CM

## 2014-10-12 DIAGNOSIS — R519 Headache, unspecified: Secondary | ICD-10-CM

## 2014-10-12 LAB — CBC WITH DIFFERENTIAL/PLATELET
BASOS ABS: 0 10*3/uL (ref 0.0–0.1)
BASOS PCT: 0 %
Basophils Absolute: 0 10*3/uL (ref 0.0–0.1)
Basophils Relative: 1 %
EOS PCT: 3 %
Eosinophils Absolute: 0.2 10*3/uL (ref 0.0–0.7)
Eosinophils Absolute: 0.2 10*3/uL (ref 0.0–0.7)
Eosinophils Relative: 3 %
HEMATOCRIT: 24.9 % — AB (ref 36.0–46.0)
HEMATOCRIT: 25.2 % — AB (ref 36.0–46.0)
Hemoglobin: 7.8 g/dL — ABNORMAL LOW (ref 12.0–15.0)
Hemoglobin: 7.9 g/dL — ABNORMAL LOW (ref 12.0–15.0)
LYMPHS ABS: 2.1 10*3/uL (ref 0.7–4.0)
LYMPHS PCT: 48 %
LYMPHS PCT: 39 %
Lymphs Abs: 2.9 10*3/uL (ref 0.7–4.0)
MCH: 32.2 pg (ref 26.0–34.0)
MCH: 32.1 pg (ref 26.0–34.0)
MCHC: 31.3 g/dL (ref 30.0–36.0)
MCHC: 31.3 g/dL (ref 30.0–36.0)
MCV: 102.9 fL — AB (ref 78.0–100.0)
MCV: 102.4 fL — AB (ref 78.0–100.0)
MONO ABS: 0.3 10*3/uL (ref 0.1–1.0)
MONO ABS: 0.4 10*3/uL (ref 0.1–1.0)
MONOS PCT: 7 %
Monocytes Relative: 5 %
NEUTROS ABS: 2.6 10*3/uL (ref 1.7–7.7)
NEUTROS PCT: 44 %
Neutro Abs: 2.7 10*3/uL (ref 1.7–7.7)
Neutrophils Relative %: 50 %
PLATELETS: 297 10*3/uL (ref 150–400)
Platelets: 296 10*3/uL (ref 150–400)
RBC: 2.42 MIL/uL — AB (ref 3.87–5.11)
RBC: 2.46 MIL/uL — ABNORMAL LOW (ref 3.87–5.11)
RDW: 13.4 % (ref 11.5–15.5)
RDW: 13.4 % (ref 11.5–15.5)
WBC: 5.9 10*3/uL (ref 4.0–10.5)
WBC: 5.4 10*3/uL (ref 4.0–10.5)

## 2014-10-12 LAB — COMPREHENSIVE METABOLIC PANEL
ALT: 21 U/L (ref 14–54)
ANION GAP: 6 (ref 5–15)
AST: 25 U/L (ref 15–41)
Albumin: 2.4 g/dL — ABNORMAL LOW (ref 3.5–5.0)
Alkaline Phosphatase: 96 U/L (ref 38–126)
BUN: 56 mg/dL — ABNORMAL HIGH (ref 6–20)
CHLORIDE: 111 mmol/L (ref 101–111)
CO2: 26 mmol/L (ref 22–32)
Calcium: 8.2 mg/dL — ABNORMAL LOW (ref 8.9–10.3)
Creatinine, Ser: 1.79 mg/dL — ABNORMAL HIGH (ref 0.44–1.00)
GFR, EST AFRICAN AMERICAN: 29 mL/min — AB (ref >60)
GFR, EST NON AFRICAN AMERICAN: 25 mL/min — AB (ref >60)
Glucose, Bld: 107 mg/dL — ABNORMAL HIGH (ref 65–99)
POTASSIUM: 4.5 mmol/L (ref 3.5–5.1)
SODIUM: 143 mmol/L (ref 135–145)
Total Bilirubin: 0.4 mg/dL (ref 0.3–1.2)
Total Protein: 6.2 g/dL — ABNORMAL LOW (ref 6.5–8.1)

## 2014-10-12 LAB — URINALYSIS, ROUTINE W REFLEX MICROSCOPIC
BILIRUBIN URINE: NEGATIVE
Glucose, UA: NEGATIVE mg/dL
HGB URINE DIPSTICK: NEGATIVE
Ketones, ur: NEGATIVE mg/dL
Leukocytes, UA: NEGATIVE
NITRITE: NEGATIVE
PROTEIN: 100 mg/dL — AB
SPECIFIC GRAVITY, URINE: 1.025 (ref 1.005–1.030)
UROBILINOGEN UA: 0.2 mg/dL (ref 0.0–1.0)
pH: 5.5 (ref 5.0–8.0)

## 2014-10-12 LAB — TROPONIN I
TROPONIN I: 0.06 ng/mL — AB (ref <0.031)
TROPONIN I: 0.05 ng/mL — AB (ref <0.031)
Troponin I: 0.05 ng/mL — ABNORMAL HIGH (ref <0.031)
Troponin I: 0.05 ng/mL — ABNORMAL HIGH (ref <0.031)

## 2014-10-12 LAB — D-DIMER, QUANTITATIVE: D-Dimer, Quant: 3.89 ug/mL-FEU — ABNORMAL HIGH (ref 0.00–0.48)

## 2014-10-12 LAB — URINE MICROSCOPIC-ADD ON

## 2014-10-12 LAB — VITAMIN B12: Vitamin B-12: 685 pg/mL (ref 180–914)

## 2014-10-12 LAB — BRAIN NATRIURETIC PEPTIDE: B NATRIURETIC PEPTIDE 5: 146 pg/mL — AB (ref 0.0–100.0)

## 2014-10-12 LAB — FERRITIN: FERRITIN: 110 ng/mL (ref 11–307)

## 2014-10-12 LAB — IRON AND TIBC
IRON: 21 ug/dL — AB (ref 28–170)
SATURATION RATIOS: 11 % (ref 10.4–31.8)
TIBC: 199 ug/dL — AB (ref 250–450)
UIBC: 178 ug/dL

## 2014-10-12 LAB — RETICULOCYTES
RBC.: 2.46 MIL/uL — ABNORMAL LOW (ref 3.87–5.11)
RETIC COUNT ABSOLUTE: 76.3 10*3/uL (ref 19.0–186.0)
Retic Ct Pct: 3.1 % (ref 0.4–3.1)

## 2014-10-12 MED ORDER — HYDRALAZINE HCL 25 MG PO TABS
50.0000 mg | ORAL_TABLET | Freq: Three times a day (TID) | ORAL | Status: DC
  Administered 2014-10-12 – 2014-10-15 (×9): 50 mg via ORAL
  Filled 2014-10-12 (×9): qty 2

## 2014-10-12 MED ORDER — ENOXAPARIN SODIUM 100 MG/ML ~~LOC~~ SOLN: 1.0000 mg/kg | Freq: Once | SUBCUTANEOUS | Status: DC

## 2014-10-12 MED ORDER — PANTOPRAZOLE SODIUM 40 MG PO TBEC
40.0000 mg | DELAYED_RELEASE_TABLET | Freq: Two times a day (BID) | ORAL | Status: DC
  Administered 2014-10-12 – 2014-10-15 (×7): 40 mg via ORAL
  Filled 2014-10-12 (×7): qty 1

## 2014-10-12 MED ORDER — ENOXAPARIN SODIUM 30 MG/0.3ML ~~LOC~~ SOLN
30.0000 mg | Freq: Every day | SUBCUTANEOUS | Status: AC
  Administered 2014-10-12: 30 mg via SUBCUTANEOUS
  Filled 2014-10-12: qty 0.3

## 2014-10-12 MED ORDER — FLUCONAZOLE 100 MG PO TABS
100.0000 mg | ORAL_TABLET | Freq: Every day | ORAL | Status: DC
  Administered 2014-10-12 – 2014-10-15 (×4): 100 mg via ORAL
  Filled 2014-10-12 (×4): qty 1

## 2014-10-12 MED ORDER — TECHNETIUM TO 99M ALBUMIN AGGREGATED
6.0000 | Freq: Once | INTRAVENOUS | Status: AC | PRN
  Administered 2014-10-12: 5.9 via INTRAVENOUS

## 2014-10-12 MED ORDER — ACETAMINOPHEN 325 MG PO TABS: 650.0000 mg | ORAL_TABLET | Freq: Four times a day (QID) | ORAL | Status: DC | PRN

## 2014-10-12 MED ORDER — TECHNETIUM TC 99M DIETHYLENETRIAME-PENTAACETIC ACID
40.0000 | Freq: Once | INTRAVENOUS | Status: DC | PRN
  Administered 2014-10-12: 42 via INTRAVENOUS
  Filled 2014-10-12: qty 40

## 2014-10-12 MED ORDER — METOPROLOL TARTRATE 25 MG PO TABS
25.0000 mg | ORAL_TABLET | Freq: Two times a day (BID) | ORAL | Status: DC
  Administered 2014-10-12 – 2014-10-15 (×7): 25 mg via ORAL
  Filled 2014-10-12 (×7): qty 1

## 2014-10-12 MED ORDER — ATORVASTATIN CALCIUM 20 MG PO TABS
20.0000 mg | ORAL_TABLET | Freq: Every day | ORAL | Status: DC
  Administered 2014-10-12 – 2014-10-15 (×4): 20 mg via ORAL
  Filled 2014-10-12 (×4): qty 1

## 2014-10-12 MED ORDER — ASPIRIN EC 81 MG PO TBEC
81.0000 mg | DELAYED_RELEASE_TABLET | ORAL | Status: DC
  Administered 2014-10-12 – 2014-10-14 (×2): 81 mg via ORAL
  Filled 2014-10-12 (×4): qty 1

## 2014-10-12 MED ORDER — AMLODIPINE BESYLATE 5 MG PO TABS
5.0000 mg | ORAL_TABLET | Freq: Every day | ORAL | Status: DC
  Administered 2014-10-12 – 2014-10-15 (×4): 5 mg via ORAL
  Filled 2014-10-12 (×4): qty 1

## 2014-10-12 MED ORDER — FUROSEMIDE 10 MG/ML IJ SOLN
40.0000 mg | Freq: Once | INTRAMUSCULAR | Status: AC
  Administered 2014-10-12: 40 mg via INTRAVENOUS
  Filled 2014-10-12: qty 4

## 2014-10-12 MED ORDER — FUROSEMIDE 10 MG/ML IJ SOLN
20.0000 mg | Freq: Two times a day (BID) | INTRAMUSCULAR | Status: DC
  Administered 2014-10-12 – 2014-10-14 (×4): 20 mg via INTRAVENOUS
  Filled 2014-10-12 (×4): qty 2

## 2014-10-12 NOTE — ED Notes (Addendum)
Pt arrived to er from [facility] with c/o sob, extremity swelling, generalized body aches, pulse ox on room air 90% per ems,

## 2014-10-12 NOTE — ED Provider Notes (Signed)
CSN: [id number]     Arrival date & time 10/12/14  0222 History   First MD Initiated Contact with Patient 10/12/14 [phone]     Chief Complaint  Patient presents with  . Shortness of Breath     (Consider location/radiation/quality/duration/timing/severity/associated sxs/prior Treatment) Patient is a 81 y.o. female presenting with shortness of breath. The history is provided by the patient and the nursing home.  Shortness of Breath She was transferred from a nursing home because of dyspnea. EMS reported oxygen saturation of 90%. She apparently has some chronic swelling in her legs. A family member is with her but is not able to give any additional information. Patient is complaining of a headache but I cannot get any other information from her.  Past Medical History  Diagnosis Date  . Hypertension   . Shortness of breath dyspnea    Past Surgical History  Procedure Laterality Date  . Ventral hernia repair N/A 03/25/2014    Procedure: VENTRAL HERIORRHAPHY WITH MESH;  Surgeon: Franky Macho Md, MD;  Location: [facility];  Service: General;  Laterality: N/A;  . Insertion of mesh  03/25/2014    Procedure: INSERTION OF MESH;  Surgeon: Franky Macho Md, MD;  Location: [facility];  Service: General;;  Gaspar Bidding dilation Left 09/09/2014    Procedure: SAVORY DILATION;  Surgeon: Dorena Cookey, MD;  Location: [facility] ENDOSCOPY;  Service: Endoscopy;  Laterality: Left;  . Esophagogastroduodenoscopy (egd) with propofol  09/09/2014    Procedure: ESOPHAGOGASTRODUODENOSCOPY (EGD) WITH PROPOFOL;  Surgeon: Dorena Cookey, MD;  Location: [facility] ENDOSCOPY;  Service: Endoscopy;;   No family history on file. Social History  Substance Use Topics  . Smoking status: Never Smoker   . Smokeless tobacco: None  . Alcohol Use: No   OB History    No data available     Review of Systems  Unable to perform ROS: Dementia  Respiratory: Positive for shortness of breath.       Allergies  Review of patient's allergies indicates no known  allergies.  Home Medications   Prior to Admission medications   Medication Sig Start Date End Date Taking? Authorizing Provider  acetaminophen (TYLENOL) 325 MG tablet Take 2 tablets (650 mg total) by mouth every 6 (six) hours as needed for mild pain (or Fever >/= 101). 09/10/14   Alison Murray, MD  amLODipine (NORVASC) 5 MG tablet Take 5 mg by mouth daily.    Historical Provider, MD  aspirin EC 81 MG tablet Take 81 mg by mouth every other day.     Historical Provider, MD  atorvastatin (LIPITOR) 20 MG tablet Take 20 mg by mouth daily.    Historical Provider, MD  ferrous sulfate 325 (65 FE) MG tablet Take 325 mg by mouth daily with breakfast.    Historical Provider, MD  fluconazole (DIFLUCAN) 100 MG tablet Take 1 tablet (100 mg total) by mouth daily. 09/10/14   Alison Murray, MD  hydrALAZINE (APRESOLINE) 50 MG tablet Take 1 tablet (50 mg total) by mouth every 8 (eight) hours. 09/10/14   Alison Murray, MD  Multiple Vitamins-Minerals ([employer] SPECTRAVITE ADULT 50+) TABS Take 1 tablet by mouth daily.    Historical Provider, MD  Multiple Vitamins-Minerals (MULTIVITAMIN WITH MINERALS) tablet Take 1 tablet by mouth daily.    Historical Provider, MD  ondansetron (ZOFRAN) 4 MG tablet Take 1 tablet (4 mg total) by mouth every 6 (six) hours as needed for nausea. 09/10/14   Alison Murray, MD  pantoprazole (PROTONIX) 40 MG tablet Take  1 tablet (40 mg total) by mouth 2 (two) times daily. 09/10/14   Alison Murray, MD  senna (SENOKOT) 8.6 MG tablet Take 1 tablet by mouth daily.    Historical Provider, MD   BP 171/85 mmHg  Pulse 80  Temp(Src) 98.2 F (36.8 C) (Rectal)  Resp 32  SpO2 90% Physical Exam  Nursing note and vitals reviewed.  81 year old female, resting comfortably and in no acute distress. Vital signs are significant for hypertension and tachypnea. Oxygen saturation is 90%, which is hypoxic. Head is normocephalic and atraumatic. PERRLA, EOMI. Oropharynx is clear. Neck is nontender and supple without  adenopathy. JVD is present at 90. Back is nontender and there is no CVA tenderness. Lungs have fine rales at the bases. Chest is nontender. Heart has regular rate and rhythm with 2/6 systolic ejection murmur heard at the upper left sternal border. Abdomen is soft, flat, nontender without masses or hepatosplenomegaly and peristalsis is normoactive. Extremities: Both lower legs are wrapped in Ace bandage and Ace bandages not removed. She has 2-3+ edema of both legs and also had 2+ edema of her arms. Skin is warm and dry without rash. Neurologic: She is awake and oriented to person but not place or time, she follows commands, cranial nerves are intact, there are no motor or sensory deficits.  ED Course  Procedures (including critical care time) Labs Review Results for orders placed or performed during the hospital encounter of 10/12/14  Comprehensive metabolic panel  Result Value Ref Range   Sodium 143 135 - 145 mmol/L   Potassium 4.5 3.5 - 5.1 mmol/L   Chloride 111 101 - 111 mmol/L   CO2 26 22 - 32 mmol/L   Glucose, Bld 107 (H) 65 - 99 mg/dL   BUN 56 (H) 6 - 20 mg/dL   Creatinine, Ser 1.61 (H) 0.44 - 1.00 mg/dL   Calcium 8.2 (L) 8.9 - 10.3 mg/dL   Total Protein 6.2 (L) 6.5 - 8.1 g/dL   Albumin 2.4 (L) 3.5 - 5.0 g/dL   AST 25 15 - 41 U/L   ALT 21 14 - 54 U/L   Alkaline Phosphatase 96 38 - 126 U/L   Total Bilirubin 0.4 0.3 - 1.2 mg/dL   GFR calc non Af Amer 25 (L) >60 mL/min   GFR calc Af Amer 29 (L) >60 mL/min   Anion gap 6 5 - 15  Troponin I  Result Value Ref Range   Troponin I 0.06 (H) <0.031 ng/mL  Brain natriuretic peptide  Result Value Ref Range   B Natriuretic Peptide 146.0 (H) 0.0 - 100.0 pg/mL  CBC with Differential  Result Value Ref Range   WBC 5.9 4.0 - 10.5 K/uL   RBC 2.42 (L) 3.87 - 5.11 MIL/uL   Hemoglobin 7.8 (L) 12.0 - 15.0 g/dL   HCT 09.6 (L) 04.5 - 40.9 %   MCV 102.9 (H) 78.0 - 100.0 fL   MCH 32.2 26.0 - 34.0 pg   MCHC 31.3 30.0 - 36.0 g/dL   RDW 81.1  91.4 - 78.2 %   Platelets 296 150 - 400 K/uL   Neutrophils Relative % 44 %   Neutro Abs 2.6 1.7 - 7.7 K/uL   Lymphocytes Relative 48 %   Lymphs Abs 2.9 0.7 - 4.0 K/uL   Monocytes Relative 5 %   Monocytes Absolute 0.3 0.1 - 1.0 K/uL   Eosinophils Relative 3 %   Eosinophils Absolute 0.2 0.0 - 0.7 K/uL   Basophils Relative 0 %  Basophils Absolute 0.0 0.0 - 0.1 K/uL  D-dimer, quantitative  Result Value Ref Range   D-Dimer, Quant 3.89 (H) 0.00 - 0.48 ug/mL-FEU  Urinalysis, Routine w reflex microscopic (not at [facility])  Result Value Ref Range   Color, Urine YELLOW YELLOW   APPearance CLEAR CLEAR   Specific Gravity, Urine 1.025 1.005 - 1.030   pH 5.5 5.0 - 8.0   Glucose, UA NEGATIVE NEGATIVE mg/dL   Hgb urine dipstick NEGATIVE NEGATIVE   Bilirubin Urine NEGATIVE NEGATIVE   Ketones, ur NEGATIVE NEGATIVE mg/dL   Protein, ur 161 (A) NEGATIVE mg/dL   Urobilinogen, UA 0.2 0.0 - 1.0 mg/dL   Nitrite NEGATIVE NEGATIVE   Leukocytes, UA NEGATIVE NEGATIVE  Urine microscopic-add on  Result Value Ref Range   Squamous Epithelial / LPF RARE RARE   WBC, UA 3-6 <3 WBC/hpf   RBC / HPF 0-2 <3 RBC/hpf   Bacteria, UA FEW (A) RARE    Imaging Review Dg Chest Port 1 View  10/12/2014   CLINICAL DATA:  Shortness of breath, extremity swelling, and generalized body aches. Dyspnea.  EXAM: PORTABLE CHEST 1 VIEW  COMPARISON:  Frontal and lateral views 09/29/2014  FINDINGS: Progressive bibasilar airspace disease, likely combination of pleural effusions and atelectasis. Progressive cardiomegaly. Mild pulmonary edema, similar to prior. A skin fold projects over the right upper hemithorax. Chronic deformities noted about both shoulders.  IMPRESSION: Findings suggestive of progressive CHF.   Electronically Signed   By: Rubye Oaks M.D.   On: 10/12/2014 03:42   I have personally reviewed and evaluated these images and lab results as part of my medical decision-making.   EKG Interpretation   Date/Time:  Monday  October 12 2014 03:15:57 EDT Ventricular Rate:  73 PR Interval:  147 QRS Duration: 83 QT Interval:  396 QTC Calculation: 436 R Axis:   8 Text Interpretation:  Sinus rhythm Nonspecific T abnormalities, lateral  leads Minimal ST elevation, anterior leads When compared with ECG of  09/06/2014, No significant change was found Confirmed by [facility]  MD, DAVID  ([id number]) on 10/12/2014 3:20:06 AM      MDM   Final diagnoses:  Dyspnea  CHF exacerbation  Hypoxia  Renal insufficiency  Elevated troponin I level  Elevated d-dimer  Macrocytic anemia    Dyspnea and hypoxia strongly suggestive of CHF exacerbation with presence of neck vein distention and peripheral edema. Will screen for possible pulmonary embolism with d-dimer. Chest x-ray is obtained as well as metabolic panel and BNP and troponin. She is given a dose of furosemide.  Chest x-ray is consistent with CHF. BNP is only modestly elevated and may be somewhat protected by a degree of right-sided heart failure with edema. Renal insufficiency is noted with creatinine not significantly changed with BUN moderately increased over baseline. This is also consistent with CHF. D-dimer is come back significantly elevated. Unfortunately, creatinine is too high to allow a CT angiogram. Troponin I has come back mildly elevated probably representing demand ischemia. She will need to have V/Q scan since he cannot have d-dimer. She is started on enoxaparin which would be appropriate both for cardiac ischemia and pulmonary embolism. Venous ultrasound is ordered to look for possible DVT which would also be treated appropriately with enoxaparin. Case is discussed with Dr. Sharl Ma of [facility] who agrees to admit the patient.  Dione Booze, MD 10/12/14 0500

## 2014-10-12 NOTE — Progress Notes (Signed)
Patient has converted from a normal sinus rhythm to an accelerated junctional rhythm with a rate in the 70s. Contacted MD on call to notify of this as it is a new occurrence. Instructed to just monitor it for now. Will continue to monitor closely.

## 2014-10-12 NOTE — ED Notes (Signed)
Report given to [facility] RN on 300, pt has gone over to CT at this time

## 2014-10-12 NOTE — Progress Notes (Signed)
Dementia of significance. Patient denies dyspnea at present orthopnea or PND denies chest pain Joyce Carpenter UXN:[id number] DOB: [birth year] DOA: 10/12/2014 PCP: Maricela Curet, MD             Physical Exam: Blood pressure 157/70, pulse 72, temperature 98.2 F (36.8 C), temperature source Rectal, resp. rate 32, height 5' 4"  (1.626 m), weight 193 lb (87.544 kg), SpO2 99 %. no JVD no carotid bruits no thyromegaly lungs diminished breath soundsno rales wheeze rhonchi appreciable heart regular rhythm no S3 or S4 no heaves thrills rubs, 1+ chronic pedal edema noted   Investigations:  No results found for this or any previous visit (from the past 240 hour(s)).   Basic Metabolic Panel:  Recent Labs  10/12/14 0240  NA 143  K 4.5  CL 111  CO2 26  GLUCOSE 107*  BUN 56*  CREATININE 1.79*  CALCIUM 8.2*   Liver Function Tests:  Recent Labs  10/12/14 0240  AST 25  ALT 21  ALKPHOS 96  BILITOT 0.4  PROT 6.2*  ALBUMIN 2.4*     CBC:  Recent Labs  10/12/14 0240  WBC 5.9  NEUTROABS 2.6  HGB 7.8*  HCT 24.9*  MCV 102.9*  PLT 296    Ct Head Wo Contrast  10/12/2014   CLINICAL DATA:  Headache and weakness for 1 day  EXAM: CT HEAD WITHOUT CONTRAST  TECHNIQUE: Contiguous axial images were obtained from the base of the skull through the vertex without intravenous contrast.  COMPARISON:  10/05/2014  FINDINGS: Skull and Sinuses:Negative for fracture or destructive process. The mastoids, middle ears, and imaged paranasal sinuses are clear.  Orbits: No acute abnormality.  Brain: No evidence of acute infarction, hemorrhage, hydrocephalus, or mass lesion/mass effect.  Age normal cerebral volume loss and ischemic gliosis around the lateral ventricles.  IMPRESSION: No acute finding.  Normal head CT for age.   Electronically Signed   By: Monte Fantasia M.D.   On: 10/12/2014 06:23   Nm Pulmonary Perf And Vent  10/12/2014   CLINICAL DATA:  Dyspnea.  EXAM: NUCLEAR MEDICINE  VENTILATION - PERFUSION LUNG SCAN  TECHNIQUE: Ventilation images were obtained in multiple projections using inhaled aerosol Tc-21mDTPA. Perfusion images were obtained in multiple projections after intravenous injection of Tc-9100mAA.  RADIOPHARMACEUTICALS:  Forty-two Technetium-9926mPA aerosol inhalation and 5.9 Technetium-70m27m IV  COMPARISON:  None.  FINDINGS: Ventilation: There is heterogeneous ventilation of both lungs with clumping of the radiopharmaceutical within the large airways.  Perfusion: No wedge shaped peripheral perfusion defects to suggest acute pulmonary embolism.  IMPRESSION: 1. Very low probability for acute pulmonary embolus.   Electronically Signed   By: TaylKerby Moors.   On: 10/12/2014 11:26   Us VKoreaous Img Lower Bilateral  10/12/2014   CLINICAL DATA:  BILATERAL lower extremity swelling  EXAM: BILATERAL LOWER EXTREMITY VENOUS DOPPLER ULTRASOUND  TECHNIQUE: Gray-scale sonography with graded compression, as well as color Doppler and duplex ultrasound were performed to evaluate the lower extremity deep venous systems from the level of the common femoral vein and including the common femoral, femoral, profunda femoral, popliteal and calf veins including the posterior tibial, peroneal and gastrocnemius veins when visible. The superficial great saphenous vein was also interrogated. Spectral Doppler was utilized to evaluate flow at rest and with distal augmentation maneuvers in the common femoral, femoral and popliteal veins. Limitations of exam secondary to soft tissue swelling at the lower legs bilaterally, body habitus, and patient motion.  COMPARISON:  None  FINDINGS:  RIGHT LOWER EXTREMITY  Common Femoral Vein: No evidence of thrombus. Normal compressibility, respiratory phasicity and response to augmentation.  Saphenofemoral Junction: No evidence of thrombus. Normal compressibility and flow on color Doppler imaging.  Profunda Femoral Vein: No evidence of thrombus. Normal  compressibility and flow on color Doppler imaging.  Femoral Vein: No evidence of thrombus. Normal compressibility, respiratory phasicity and response to augmentation.  Popliteal Vein: No evidence of thrombus. Normal compressibility, respiratory phasicity and response to augmentation.  Calf Veins: No evidence of thrombus. Normal compressibility and flow on color Doppler imaging.  Superficial Great Saphenous Vein: Limited visualization in the calf secondary to soft tissue swelling. Visualized portion show no evidence of thrombus. Normal compressibility and flow on color Doppler imaging.  Venous Reflux:  None.  Other Findings: Calf soft tissue swelling. Complex collection RIGHT popliteal fossa question complex Baker cyst 6.1 x 1.6 x 3.8 cm.  LEFT LOWER EXTREMITY  Common Femoral Vein: No evidence of thrombus. Normal compressibility, respiratory phasicity and response to augmentation.  Saphenofemoral Junction: No evidence of thrombus. Normal compressibility and flow on color Doppler imaging.  Profunda Femoral Vein: No evidence of thrombus. Normal compressibility and flow on color Doppler imaging.  Femoral Vein: No evidence of thrombus. Normal compressibility, respiratory phasicity and response to augmentation.  Popliteal Vein: No evidence of thrombus. Normal compressibility, respiratory phasicity and response to augmentation.  Calf Veins: No evidence of thrombus. Normal compressibility and flow on color Doppler imaging.  Superficial Great Saphenous Vein: Limited visualization at the calf due to swelling. Visualized portions show no evidence of thrombus. Normal compressibility and flow on color Doppler imaging.  Venous Reflux:  None.  Other Findings: Soft tissue swelling lower LEFT leg. Complex fluid collection LEFT popliteal fossa question Baker cyst 5.2 x 1.6 x 3.7 cm.  IMPRESSION: No evidence of deep venous thrombosis in either lower extremity.  Complicated collections at the popliteal fossa bilaterally question  complicated Baker cysts.  Soft tissue swelling at the calves bilaterally   Electronically Signed   By: Lavonia Dana M.D.   On: 10/12/2014 10:52   Dg Chest Port 1 View  10/12/2014   CLINICAL DATA:  Shortness of breath, extremity swelling, and generalized body aches. Dyspnea.  EXAM: PORTABLE CHEST 1 VIEW  COMPARISON:  Frontal and lateral views 09/29/2014  FINDINGS: Progressive bibasilar airspace disease, likely combination of pleural effusions and atelectasis. Progressive cardiomegaly. Mild pulmonary edema, similar to prior. A skin fold projects over the right upper hemithorax. Chronic deformities noted about both shoulders.  IMPRESSION: Findings suggestive of progressive CHF.   Electronically Signed   By: Jeb Levering M.D.   On: 10/12/2014 03:42      Medications:   Impression: Alzheimer's dementia Anemia Active Problems:   Acute renal failure   Essential hypertension   Elevated troponin   CHF exacerbation   CHF (congestive heart failure), NYHA class IV     Plan: Hemoccults stools daily anemia profile today CBC with reticulocyte count ordered be met daily dementia panel ordered  Consultants:    Procedures   Antibiotics:                   Code Status: No code no intubation  Family Communication:    Disposition Plan see plan above  Time spent: 30 minutes   LOS: 0 days   DONDIEGO,RICHARD M   10/12/2014, 1:24 PM

## 2014-10-12 NOTE — Care Management Note (Signed)
Case Management Note  Patient Details  Name: SURAH PELLEY MRN: [id number] Date of Birth: [birth year]  Expected Discharge Date:    10/14/2014             Expected Discharge Plan:  Skilled Nursing Facility  In-House Referral:  Clinical Social Work  Discharge planning Services  CM Consult  Post Acute Care Choice:  NA Choice offered to:  NA  DME Arranged:    DME Agency:     HH Arranged:    HH Agency:     Status of Service:  Completed, signed off  Medicare Important Message Given:    Date Medicare IM Given:    Medicare IM give by:    Date Additional Medicare IM Given:    Additional Medicare Important Message give by:     If discussed at Long Length of Stay Meetings, dates discussed:    Additional Comments: Pt admitted with CHF exacerbation. Pt is from [facility]. Anticipate return to SNF at DC. CSW is aware and will arrange for return to facility when appropriate. No CM needs appropriate.  Malcolm Metro, RN 10/12/2014, 1:43 PM

## 2014-10-12 NOTE — H&P (Addendum)
PCP:   [name], MD   Chief Complaint:  Shortness of breath  HPI: 81 year old female who   has a past medical history of Hypertension and Shortness of breath dyspnea. Today was brought to the ED from skilled facility because of dyspnea. Patient's O2 sats was 90% as per EMS. Patient has chronic swelling in the legs, yesterday while putting her in the wheel chair patient says that her head was hit in the wall and she complains of headache. She denies chest pain, no nausea vomiting or diarrhea. Admits to intermittent dysuria. In the ED she was found to have positive d-dimer.  Chest x-ray showed findings just about progressive CHF. Patient also has mild elevation of troponin 0.06. One dose of Lasix 40 mg IV given in the ED.  Allergies:  No Known Allergies    Past Medical History  Diagnosis Date  . Hypertension   . Shortness of breath dyspnea     Past Surgical History  Procedure Laterality Date  . Ventral hernia repair N/A 03/25/2014    Procedure: VENTRAL HERIORRHAPHY WITH MESH;  Surgeon: Franky Macho Md, MD;  Location: [facility];  Service: General;  Laterality: N/A;  . Insertion of mesh  03/25/2014    Procedure: INSERTION OF MESH;  Surgeon: Franky Macho Md, MD;  Location: [facility];  Service: General;;  Gaspar Bidding dilation Left 09/09/2014    Procedure: SAVORY DILATION;  Surgeon: Dorena Cookey, MD;  Location: [facility] ENDOSCOPY;  Service: Endoscopy;  Laterality: Left;  . Esophagogastroduodenoscopy (egd) with propofol  09/09/2014    Procedure: ESOPHAGOGASTRODUODENOSCOPY (EGD) WITH PROPOFOL;  Surgeon: Dorena Cookey, MD;  Location: [facility] ENDOSCOPY;  Service: Endoscopy;;    Prior to Admission medications   Medication Sig Start Date End Date Taking? Authorizing Provider  acetaminophen (TYLENOL) 325 MG tablet Take 2 tablets (650 mg total) by mouth every 6 (six) hours as needed for mild pain (or Fever >/= 101). 09/10/14   Alison Murray, MD  amLODipine (NORVASC) 5 MG tablet Take 5 mg by mouth daily.     Historical Provider, MD  aspirin EC 81 MG tablet Take 81 mg by mouth every other day.     Historical Provider, MD  atorvastatin (LIPITOR) 20 MG tablet Take 20 mg by mouth daily.    Historical Provider, MD  ferrous sulfate 325 (65 FE) MG tablet Take 325 mg by mouth daily with breakfast.    Historical Provider, MD  fluconazole (DIFLUCAN) 100 MG tablet Take 1 tablet (100 mg total) by mouth daily. 09/10/14   Alison Murray, MD  hydrALAZINE (APRESOLINE) 50 MG tablet Take 1 tablet (50 mg total) by mouth every 8 (eight) hours. 09/10/14   Alison Murray, MD  Multiple Vitamins-Minerals ([employer] SPECTRAVITE ADULT 50+) TABS Take 1 tablet by mouth daily.    Historical Provider, MD  Multiple Vitamins-Minerals (MULTIVITAMIN WITH MINERALS) tablet Take 1 tablet by mouth daily.    Historical Provider, MD  ondansetron (ZOFRAN) 4 MG tablet Take 1 tablet (4 mg total) by mouth every 6 (six) hours as needed for nausea. 09/10/14   Alison Murray, MD  pantoprazole (PROTONIX) 40 MG tablet Take 1 tablet (40 mg total) by mouth 2 (two) times daily. 09/10/14   Alison Murray, MD  senna (SENOKOT) 8.6 MG tablet Take 1 tablet by mouth daily.    Historical Provider, MD    Social History:  reports that she has never smoked. She does not have any smokeless tobacco history on file. She reports that she  does not drink alcohol or use illicit drugs.    Filed Weights   10/12/14 0313  Weight: 87.544 kg (193 lb)    All the positives are listed in BOLD  Review of Systems:  HEENT: Headache, blurred vision, runny nose, sore throat Neck: Hypothyroidism, hyperthyroidism,,lymphadenopathy Chest : Shortness of breath, history of COPD, Asthma Heart : Chest pain, history of coronary arterey disease GI:  Nausea, vomiting, diarrhea, constipation, GERD GU: Dysuria, urgency, frequency of urination, hematuria Neuro: Stroke, seizures, syncope Psych: Depression, anxiety, hallucinations   Physical Exam: Blood pressure 157/70, pulse 72, temperature  98.2 F (36.8 C), temperature source Rectal, resp. rate 32, height  (1.626 m), weight 87.544 kg (193 lb), SpO2 99 %. Constitutional:   Patient is a well-developed and well-nourished female* in no acute distress and cooperative with exam. Head: Normocephalic and atraumatic Mouth: Mucus membranes moist Eyes: PERRL, EOMI, conjunctivae normal Neck: Supple, No Thyromegaly Cardiovascular: RRR, S1 normal, S2 normal Pulmonary/Chest: Bilateral crackles Abdominal: Soft. Non-tender, non-distended, bowel sounds are normal, no masses, organomegaly, or guarding present.  Neurological: A&O x3, Strength is normal and symmetric bilaterally, cranial nerve II-XII are grossly intact, no focal motor deficit, sensory intact to light touch bilaterally.  Extremities : No Cyanosis, bilateral 2+ pitting edema  Labs on Admission:  Basic Metabolic Panel:  Recent Labs Lab 10/12/14 0240  NA 143  K 4.5  CL 111  CO2 26  GLUCOSE 107*  BUN 56*  CREATININE 1.79*  CALCIUM 8.2*   Liver Function Tests:  Recent Labs Lab 10/12/14 0240  AST 25  ALT 21  ALKPHOS 96  BILITOT 0.4  PROT 6.2*  ALBUMIN 2.4*   CBC:  Recent Labs Lab 10/05/14 1300 10/06/14 1100 10/12/14 0240  WBC  --   --  5.9  NEUTROABS  --   --  2.6  HGB 8.5* 8.1* 7.8*  HCT 26.8* 25.6* 24.9*  MCV  --   --  102.9*  PLT  --   --  296   Cardiac Enzymes:  Recent Labs Lab 10/12/14 0240  TROPONINI 0.06*    BNP (last 3 results)  Recent Labs  09/06/14 0218 10/12/14 0240  BNP 327.0* 146.0*    Radiological Exams on Admission: Dg Chest Port 1 View  10/12/2014   CLINICAL DATA:  Shortness of breath, extremity swelling, and generalized body aches. Dyspnea.  EXAM: PORTABLE CHEST 1 VIEW  COMPARISON:  Frontal and lateral views 09/29/2014  FINDINGS: Progressive bibasilar airspace disease, likely combination of pleural effusions and atelectasis. Progressive cardiomegaly. Mild pulmonary edema, similar to prior. A skin fold projects over the  right upper hemithorax. Chronic deformities noted about both shoulders.  IMPRESSION: Findings suggestive of progressive CHF.   Electronically Signed   By: Rubye Oaks M.D.   On: 10/12/2014 03:42    EKG: Independently reviewed. Normal sinus rhythm, nonspecific T-wave abnormalities   Assessment/Plan Active Problems:   Acute renal failure   Essential hypertension   Elevated troponin   CHF exacerbation   CHF (congestive heart failure), NYHA class IV  CHF exacerbation Patient has history of diastolic heart failure, and this time she presented with acute on chronic diastolic heart failure. Will continue Lasix 20 g IV every 12 hours. Obtain cardiac consultation for further evaluation and management.  Elevated troponin Mild elevation of troponin likely from the acute on chronic diastolic CHF. EKG shows nonspecific T-wave abnormalities in the lateral leads. Follow serial troponin  Headache Patient has complains of headache since she hit her head in the  wall Will obtain CT head to rule out any intracranial bleed or SDH  Hypertension Continue amlodipine, will start her on metoprolol 25 mg by mouth twice a day  Anemia Patient has chronic anemia with hemoglobin around 8, today hemoglobin is 7.8 Follow CBC in a.m.  Elevated d-dimer D-dimer elevated 3.89, bilateral venous duplex ordered by ED physician VQ scan ordered, will not be able to do CT angio chest due to renal insufficiency Full dose Lovenox ordered in the ED x1. Follow the results, if positive for DVT or PE, consider starting full dose Lovenox   Code status: Full code  Family discussion: Admission, patients condition and plan of care including tests being ordered have been discussed with the patient and *her sisters at bedside* who indicate understanding and agree with the plan and Code Status.   Time Spent on Admission: 60 min  LAMA,GAGAN S [facility] Pager: [phone] 10/12/2014, 5:23 AM  If 7PM-7AM, please  contact night-coverage  [email or web address]  Password [email or web address]

## 2014-10-12 NOTE — Progress Notes (Signed)
VQ low probability for PE.  Lovenox has been reduced to VTE Prophylaxis dosing.  Adjusted for renal function.  Joyce Carpenter, [facility]  10/12/2014 12:48 PM

## 2014-10-13 ENCOUNTER — Inpatient Hospital Stay (HOSPITAL_COMMUNITY): Payer: Medicare Other

## 2014-10-13 DIAGNOSIS — I5033 Acute on chronic diastolic (congestive) heart failure: Secondary | ICD-10-CM

## 2014-10-13 LAB — CBC
HCT: 24.9 % — ABNORMAL LOW (ref 36.0–46.0)
HEMOGLOBIN: 7.7 g/dL — AB (ref 12.0–15.0)
MCH: 32 pg (ref 26.0–34.0)
MCHC: 30.9 g/dL (ref 30.0–36.0)
MCV: 103.3 fL — ABNORMAL HIGH (ref 78.0–100.0)
Platelets: 295 10*3/uL (ref 150–400)
RBC: 2.41 MIL/uL — ABNORMAL LOW (ref 3.87–5.11)
RDW: 13.2 % (ref 11.5–15.5)
WBC: 5.5 10*3/uL (ref 4.0–10.5)

## 2014-10-13 LAB — COMPREHENSIVE METABOLIC PANEL
ALBUMIN: 2.5 g/dL — AB (ref 3.5–5.0)
ALT: 25 U/L (ref 14–54)
ANION GAP: 5 (ref 5–15)
AST: 30 U/L (ref 15–41)
Alkaline Phosphatase: 99 U/L (ref 38–126)
BUN: 62 mg/dL — ABNORMAL HIGH (ref 6–20)
CO2: 27 mmol/L (ref 22–32)
Calcium: 8.2 mg/dL — ABNORMAL LOW (ref 8.9–10.3)
Chloride: 110 mmol/L (ref 101–111)
Creatinine, Ser: 1.76 mg/dL — ABNORMAL HIGH (ref 0.44–1.00)
GFR calc Af Amer: 30 mL/min — ABNORMAL LOW (ref >60)
GFR calc non Af Amer: 26 mL/min — ABNORMAL LOW (ref >60)
GLUCOSE: 109 mg/dL — AB (ref 65–99)
POTASSIUM: 4.7 mmol/L (ref 3.5–5.1)
SODIUM: 142 mmol/L (ref 135–145)
Total Bilirubin: 0.4 mg/dL (ref 0.3–1.2)
Total Protein: 6.2 g/dL — ABNORMAL LOW (ref 6.5–8.1)

## 2014-10-13 LAB — FOLATE: Folate: 35 ng/mL (ref >5.9)

## 2014-10-13 NOTE — Clinical Social Work Note (Signed)
Clinical Social Work Assessment  Patient Details  Name: Joyce Carpenter MRN: [id number] Date of Birth: Jun 04, 1930  Date of referral:  10/13/14               Reason for consult:  Facility Placement                Permission sought to share information with:  Family Supports Permission granted to share information::  Yes, Verbal Permission Granted  Name::     Production designer, theatre/television/film::     Relationship::  sister  Contact Information:     Housing/Transportation Living arrangements for the past 2 months:  [place] of Information:  Patient, Other (Comment Required) (sister) Patient Interpreter Needed:  None Criminal Activity/Legal Involvement Pertinent to Current Situation/Hospitalization:  No - Comment as needed Significant Relationships:  Siblings Lives with:  Facility Resident Do you feel safe going back to the place where you live?  Yes Need for family participation in patient care:  Yes (Comment)  Care giving concerns:  Pt is resident at [facility].    Social Worker assessment / plan:  CSW met with pt at bedside. Pt shared that her sister, Oris Drone is her best support and she visits almost daily. CSW started discussing SNF and pt did not understand question. She requested that CSW talk to [facility]. CSW spoke with Oris Drone who reports pt has been a resident at [facility] for about a month. Prior to this, pt lived with [facility]. Per Jackelyn Poling at facility, pt is skilled level of care and okay to return. Jackelyn Poling indicates pt has not made much progress with PT, so long term plan is unclear at this point.   Employment status:  Retired Nurse, adult PT Recommendations:  Not assessed at this time Information / Referral to community resources:  Other (Comment Required) (return to [facility])  Patient/Family's Response to care:  [name] agreeable to return to [facility].   Patient/Family's Understanding of and Emotional Response to Diagnosis, Current Treatment, and  Prognosis:  [name] aware of admission diagnosis. She remains hopeful for pt to be able to return to her home after rehab.   Emotional Assessment Appearance:  Appears stated age Attitude/Demeanor/Rapport:  Other (Cooperative) Affect (typically observed):  Appropriate Orientation:  Oriented to Self, Oriented to Place, Oriented to Situation Alcohol / Substance use:  Not Applicable Psych involvement (Current and /or in the community):  No (Comment)  Discharge Needs  Concerns to be addressed:  Discharge Planning Concerns Readmission within the last 30 days:  No Current discharge risk:  None Barriers to Discharge:  Continued Medical Work up   [employer], [employer], [place] 10/13/2014, 10:04 AM [phone]

## 2014-10-13 NOTE — Progress Notes (Signed)
2-D echo shows normal systolic function and grade 1 diastolic dysfunction EF 6065% and less anxious today about fall or hitting her head no neurologic deficits that are grossly visible alert and oriented in 3 spheres Joyce Carpenter ZOX:[id number] DOB: 1930-07-03 DOA: 10/12/2014 PCP: Joyce Stalling, MD             Physical Exam: Blood pressure 141/61, pulse 66, temperature 98.5 F (36.9 C), temperature source Oral, resp. rate 18, height  (1.626 m), weight 192 lb 15.9 oz (87.541 kg), SpO2 100 %. cranial nerves II through XII grossly intact patient moves all 470s generalized this any from being bedridden lungs no rales wheeze or rhonchi appreciable heart regular rhythm no S3-S4 no heaves thrills rubs   Investigations:  No results found for this or any previous visit (from the past 240 hour(s)).   Basic Metabolic Panel:  Recent Labs  81/19/14 0240 10/13/14 0544  NA 143 142  K 4.5 4.7  CL 111 110  CO2 26 27  GLUCOSE 107* 109*  BUN 56* 62*  CREATININE 1.79* 1.76*  CALCIUM 8.2* 8.2*   Liver Function Tests:  Recent Labs  10/12/14 0240 10/13/14 0544  AST 25 30  ALT 21 25  ALKPHOS 96 99  BILITOT 0.4 0.4  PROT 6.2* 6.2*  ALBUMIN 2.4* 2.5*     CBC:  Recent Labs  10/12/14 0240 10/12/14 1153 10/13/14 0544  WBC 5.9 5.4 5.5  NEUTROABS 2.6 2.7  --   HGB 7.8* 7.9* 7.7*  HCT 24.9* 25.2* 24.9*  MCV 102.9* 102.4* 103.3*  PLT 296 297 295    Ct Head Wo Contrast  10/12/2014   CLINICAL DATA:  Headache and weakness for 1 day  EXAM: CT HEAD WITHOUT CONTRAST  TECHNIQUE: Contiguous axial images were obtained from the base of the skull through the vertex without intravenous contrast.  COMPARISON:  10/05/2014  FINDINGS: Skull and Sinuses:Negative for fracture or destructive process. The mastoids, middle ears, and imaged paranasal sinuses are clear.  Orbits: No acute abnormality.  Brain: No evidence of acute infarction, hemorrhage, hydrocephalus, or mass lesion/mass  effect.  Age normal cerebral volume loss and ischemic gliosis around the lateral ventricles.  IMPRESSION: No acute finding.  Normal head CT for age.   Electronically Signed   By: Marnee Spring M.D.   On: 10/12/2014 06:23   Nm Pulmonary Perf And Vent  10/12/2014   CLINICAL DATA:  Dyspnea.  EXAM: NUCLEAR MEDICINE VENTILATION - PERFUSION LUNG SCAN  TECHNIQUE: Ventilation images were obtained in multiple projections using inhaled aerosol Tc-64m DTPA. Perfusion images were obtained in multiple projections after intravenous injection of Tc-73m MAA.  RADIOPHARMACEUTICALS:  Forty-two Technetium-25m DTPA aerosol inhalation and 5.9 Technetium-80m MAA IV  COMPARISON:  None.  FINDINGS: Ventilation: There is heterogeneous ventilation of both lungs with clumping of the radiopharmaceutical within the large airways.  Perfusion: No wedge shaped peripheral perfusion defects to suggest acute pulmonary embolism.  IMPRESSION: 1. Very low probability for acute pulmonary embolus.   Electronically Signed   By: Signa Kell M.D.   On: 10/12/2014 11:26   US Venous Img Lower Bilateral  10/12/2014   CLINICAL DATA:  BILATERAL lower extremity swelling  EXAM: BILATERAL LOWER EXTREMITY VENOUS DOPPLER ULTRASOUND  TECHNIQUE: Gray-scale sonography with graded compression, as well as color Doppler and duplex ultrasound were performed to evaluate the lower extremity deep venous systems from the level of the common femoral vein and including the common femoral, femoral, profunda femoral, popliteal and calf veins including the  posterior tibial, peroneal and gastrocnemius veins when visible. The superficial great saphenous vein was also interrogated. Spectral Doppler was utilized to evaluate flow at rest and with distal augmentation maneuvers in the common femoral, femoral and popliteal veins. Limitations of exam secondary to soft tissue swelling at the lower legs bilaterally, body habitus, and patient motion.  COMPARISON:  None  FINDINGS: RIGHT  LOWER EXTREMITY  Common Femoral Vein: No evidence of thrombus. Normal compressibility, respiratory phasicity and response to augmentation.  Saphenofemoral Junction: No evidence of thrombus. Normal compressibility and flow on color Doppler imaging.  Profunda Femoral Vein: No evidence of thrombus. Normal compressibility and flow on color Doppler imaging.  Femoral Vein: No evidence of thrombus. Normal compressibility, respiratory phasicity and response to augmentation.  Popliteal Vein: No evidence of thrombus. Normal compressibility, respiratory phasicity and response to augmentation.  Calf Veins: No evidence of thrombus. Normal compressibility and flow on color Doppler imaging.  Superficial Great Saphenous Vein: Limited visualization in the calf secondary to soft tissue swelling. Visualized portion show no evidence of thrombus. Normal compressibility and flow on color Doppler imaging.  Venous Reflux:  None.  Other Findings: Calf soft tissue swelling. Complex collection RIGHT popliteal fossa question complex Baker cyst 6.1 x 1.6 x 3.8 cm.  LEFT LOWER EXTREMITY  Common Femoral Vein: No evidence of thrombus. Normal compressibility, respiratory phasicity and response to augmentation.  Saphenofemoral Junction: No evidence of thrombus. Normal compressibility and flow on color Doppler imaging.  Profunda Femoral Vein: No evidence of thrombus. Normal compressibility and flow on color Doppler imaging.  Femoral Vein: No evidence of thrombus. Normal compressibility, respiratory phasicity and response to augmentation.  Popliteal Vein: No evidence of thrombus. Normal compressibility, respiratory phasicity and response to augmentation.  Calf Veins: No evidence of thrombus. Normal compressibility and flow on color Doppler imaging.  Superficial Great Saphenous Vein: Limited visualization at the calf due to swelling. Visualized portions show no evidence of thrombus. Normal compressibility and flow on color Doppler imaging.  Venous  Reflux:  None.  Other Findings: Soft tissue swelling lower LEFT leg. Complex fluid collection LEFT popliteal fossa question Baker cyst 5.2 x 1.6 x 3.7 cm.  IMPRESSION: No evidence of deep venous thrombosis in either lower extremity.  Complicated collections at the popliteal fossa bilaterally question complicated Baker cysts.  Soft tissue swelling at the calves bilaterally   Electronically Signed   By: [name]   On: 10/12/2014 10:52   Dg Chest Port 1 View  10/12/2014   CLINICAL DATA:  Shortness of breath, extremity swelling, and generalized body aches. Dyspnea.  EXAM: PORTABLE CHEST 1 VIEW  COMPARISON:  Frontal and lateral views 09/29/2014  FINDINGS: Progressive bibasilar airspace disease, likely combination of pleural effusions and atelectasis. Progressive cardiomegaly. Mild pulmonary edema, similar to prior. A skin fold projects over the right upper hemithorax. Chronic deformities noted about both shoulders.  IMPRESSION: Findings suggestive of progressive CHF.   Electronically Signed   By: Rubye Oaks M.D.   On: 10/12/2014 03:42      Medications:   Impression: Anxiety over fall in nursing facility Active Problems:   Acute renal failure   Essential hypertension   Elevated troponin   CHF exacerbation   CHF (congestive heart failure), NYHA class IV   Acute on chronic diastolic heart failure     Plan: Aggressive physical therapy for strengthening and ambulation out of bed to chair 3 times a day  Consultants:    Procedures   Antibiotics:  Code Status:  Family Communication:  Spoke with several family members during visit today and after visit  Disposition Plan see plan above  Time spent: 30 minutes   LOS: 1 day   DONDIEGO,RICHARD M   10/13/2014, 12:43 PM

## 2014-10-13 NOTE — Evaluation (Signed)
Physical Therapy Evaluation Patient Details Name: Joyce Carpenter MRN: [id number] DOB: [birth year] Today's Date: 10/13/2014   History of Present Illness  Today was brought to the ED from skilled facility because of dyspnea. Patient's O2 sats was 90% as per EMS. Patient has chronic swelling in the legs, yesterday while putting her in the wheel chair patient says that her head was hit in the wall and she complains of headache  Clinical Impression   Joyce Carpenter was seen for evaluation.  She was very pleasant and cooperative, morbidly obese with significant generalized joint degeneration.  Joyce Carpenter has been receiving daily Joyce Carpenter at SNF for about the past month and has made little to no progress.   She needs total assist to transfer in the bed and continues to need a full lift to transfer bed to chair.  In my opinion, she has reached her maximal functional potential.    Follow Up Recommendations No Joyce Carpenter follow up    Equipment Recommendations  None recommended by Joyce Carpenter    Recommendations for Other Services   none    Precautions / Restrictions Precautions Precautions: Fall Restrictions Weight Bearing Restrictions: No      Mobility  Bed Mobility Overal bed mobility: Needs Assistance Bed Mobility: Supine to Sit;Sit to Supine     Supine to sit: Total assist;HOB elevated Sit to supine: Total assist      Transfers Overall transfer level: Needs assistance               General transfer comment: unable to transfer Joyce Carpenter bed to chair with a sliding transfer...will need a lift  Ambulation/Gait             General Gait Details: not ambulatory  Stairs            Wheelchair Mobility    Modified Rankin (Stroke Patients Only)       Balance Overall balance assessment:  (good sitting balance with no UE assist)                                           Pertinent Vitals/Pain Pain Assessment: No/denies pain (at rest)    Home Living Family/patient expects to be  discharged to:: Skilled nursing facility                      Prior Function Level of Independence: Needs assistance   Gait / Transfers Assistance Needed: Joyce Carpenter has been at SNF for the past month and has made little to no progress per chart report.  She is not ambulatory and requires a lift to transfer bed to chair  ADL's / Homemaking Assistance Needed: assist needed with bathing and dressing, able to self feed        Hand Dominance   Dominant Hand: Right    Extremity/Trunk Assessment               Lower Extremity Assessment: Generalized weakness (has significant right knee pain with flexion)      Cervical / Trunk Assessment: Kyphotic  Communication   Communication: HOH  Cognition Arousal/Alertness: Awake/alert Behavior During Therapy: WFL for tasks assessed/performed Overall Cognitive Status: Within Functional Limits for tasks assessed                      General Comments      Exercises General Exercises - Lower Extremity Ankle Circles/Pumps:  AROM;AAROM;Both;10 reps;Supine Short Arc Quad: AAROM;Both;10 reps;Supine Heel Slides: AAROM;Both;10 reps;Supine Hip ABduction/ADduction: AAROM;Both;10 reps;Supine      Assessment/Plan    Joyce Carpenter Assessment Patent does not need any further Joyce Carpenter services  Joyce Carpenter Diagnosis     Joyce Carpenter Problem List    Joyce Carpenter Treatment Interventions     Joyce Carpenter Goals (Current goals can be found in the Care Plan section) Acute Rehab Joyce Carpenter Goals Joyce Carpenter Goal Formulation: All assessment and education complete, DC therapy    Frequency     Barriers to discharge        Co-evaluation               End of Session Equipment Utilized During Treatment: Gait belt Activity Tolerance: Patient tolerated treatment well Patient left: in bed;with call bell/phone within reach;with bed alarm set Nurse Communication: Mobility status;Need for lift equipment         Time: 6213-0865 Joyce Carpenter Time Calculation (min) (ACUTE ONLY): 41 min   Charges:   Joyce Carpenter  Evaluation $Initial Joyce Carpenter Evaluation Tier I: 1 Procedure     Joyce Carpenter G CodesKonrad Penta  Joyce Carpenter 10/13/2014, 2:52 PM [phone]

## 2014-10-13 NOTE — Consult Note (Signed)
CARDIOLOGY CONSULT NOTE   Patient ID: Joyce Carpenter MRN: [id number] DOB/AGE: 1930-05-29 81 y.o.  Admit Date: 10/12/2014 Referring Physician: Delbert Harness, Richard MD Primary Physician: [name], MD Consulting Cardiologist: Dina Rich MD Primary Cardiologist: Charlton Haws Reason for Consultation: CHF  Clinical Summary Joyce Carpenter is a 81 y.o.female admitted with worsening dyspnea and LEE. She was recently discharged from [facility] hospital with atypical chest pain, dysphasia,  hypertension, and anemia. She was found at that time to have mildly elevated troponin at 0.19 with no ischemic changes on EKG. She was not found to be a candidate for invasive studies due to age and co-morbidities. She had EDG and barium swallow for dysphasia and found to have achalasia.   She is living at [facility] and has been for a month. She states that the staff was lifting her out of bed and she hit her head on the wall. Since that time she states she has been feeling worse, with increased work of breathing and wheezing. LEE became more prominent. Due to worsening symptoms [facility] sent her to ER.   On arrival to ER, BP 171/85, HR 80, O2 Sat 90% with tachypnea. D-dimer elevated but VQ negative for PE.  Creatinine 1.79, close to baseline from previous labs on recent discharge. Troponin 0.06 trending down from recent admission . She was found to be anemic, 7.8/24.9. Venous doppler ultrasound for LEE was negative for DVT. CXR demonstrated CHF. NSR with T-wave flattening in the lateral leads. Unchanged from recent admission EKG. She was treated with IV lasix 40 mg and LMWH. She has diuresed 1.2 liters since admission.    No Known Allergies  Medications Scheduled Medications: . amLODipine  5 mg Oral Daily  . aspirin EC  81 mg Oral QODAY  . atorvastatin  20 mg Oral Daily  . fluconazole  100 mg Oral Daily  . furosemide  20 mg Intravenous Q12H  . hydrALAZINE  50 mg Oral 3 times per day  . metoprolol  tartrate  25 mg Oral BID  . pantoprazole  40 mg Oral BID    Infusions:    PRN Medications: acetaminophen, technetium TC 23M diethylenetriame-pentaacetic acid   Past Medical History  Diagnosis Date  . Hypertension   . Shortness of breath dyspnea     Past Surgical History  Procedure Laterality Date  . Ventral hernia repair N/A 03/25/2014    Procedure: VENTRAL HERIORRHAPHY WITH MESH;  Surgeon: Franky Macho Md, MD;  Location: [facility] ORS;  Service: General;  Laterality: N/A;  . Insertion of mesh  03/25/2014    Procedure: INSERTION OF MESH;  Surgeon: Franky Macho Md, MD;  Location: [facility] ORS;  Service: General;;  Gaspar Bidding dilation Left 09/09/2014    Procedure: SAVORY DILATION;  Surgeon: Dorena Cookey, MD;  Location: [facility] ENDOSCOPY;  Service: Endoscopy;  Laterality: Left;  . Esophagogastroduodenoscopy (egd) with propofol  09/09/2014    Procedure: ESOPHAGOGASTRODUODENOSCOPY (EGD) WITH PROPOFOL;  Surgeon: Dorena Cookey, MD;  Location: [facility] ENDOSCOPY;  Service: Endoscopy;;    No family history on file.  Social History Joyce Carpenter reports that she has never smoked. She does not have any smokeless tobacco history on file. Joyce Carpenter reports that she does not drink alcohol.  Review of Systems Complete review of systems are found to be negative unless outlined in H&P above.  Physical Examination Blood pressure 141/61, pulse 66, temperature 98.5 F (36.9 C), temperature source Oral, resp. rate 18, height  (1.626 m), weight 192 lb 15.9 oz (87.541  kg), SpO2 100 %.  Intake/Output Summary (Last 24 hours) at 10/13/14 1135 Last data filed at 10/13/14 0800  Gross per 24 hour  Intake    440 ml  Output   1300 ml  Net   -860 ml    Telemetry: NSR  JWJ:[id number] with speaking.  HEENT: Conjunctiva and lids normal, oropharynx clear with moist mucosa. Neck: Supple, no elevated JVP or carotid bruits, no thyromegaly. Lungs: Diminished in the bases with upper airway wheezing.  Cardiac: Regular rate and rhythm,  no S3 or significant systolic murmur, no pericardial rub. Abdomen: Soft, nontender, no hepatomegaly, bowel sounds present, no guarding or rebound. Extremities: No pitting edema, distal pulses 2+. Skin: Warm and dry. Musculoskeletal: No kyphosis. Neuropsychiatric: Alert and oriented x3, affect grossly appropriate.  Prior Cardiac Testing/Procedures 1.Echocardiogram 09/07/2014 Left ventricle: The cavity size was normal. Wall thickness was increased in a pattern of moderate LVH. Systolic function was normal. The estimated ejection fraction was in the range of 60% to 65%. Wall motion was normal; there were no regional wall motion abnormalities. Doppler parameters are consistent with abnormal left ventricular relaxation (grade 1 diastolic dysfunction). - Right atrium: The atrium was mildly dilated. - Pericardium, extracardiac: A trivial pericardial effusion was identified.  Lab Results  Basic Metabolic Panel:  Recent Labs Lab 10/12/14 0240 10/13/14 0544  NA 143 142  K 4.5 4.7  CL 111 110  CO2 26 27  GLUCOSE 107* 109*  BUN 56* 62*  CREATININE 1.79* 1.76*  CALCIUM 8.2* 8.2*    Liver Function Tests:  Recent Labs Lab 10/12/14 0240 10/13/14 0544  AST 25 30  ALT 21 25  ALKPHOS 96 99  BILITOT 0.4 0.4  PROT 6.2* 6.2*  ALBUMIN 2.4* 2.5*    CBC:  Recent Labs Lab 10/12/14 0240 10/12/14 1153 10/13/14 0544  WBC 5.9 5.4 5.5  NEUTROABS 2.6 2.7  --   HGB 7.8* 7.9* 7.7*  HCT 24.9* 25.2* 24.9*  MCV 102.9* 102.4* 103.3*  PLT 296 297 295    Cardiac Enzymes:  Recent Labs Lab 10/12/14 0240 10/12/14 0635 10/12/14 1153 10/12/14 1713  TROPONINI 0.06* 0.05* 0.05* 0.05*    Radiology: Ct Head Wo Contrast  10/12/2014   CLINICAL DATA:  Headache and weakness for 1 day  EXAM: CT HEAD WITHOUT CONTRAST  TECHNIQUE: Contiguous axial images were obtained from the base of the skull through the vertex without intravenous contrast.  COMPARISON:  10/05/2014  FINDINGS:  Skull and Sinuses:Negative for fracture or destructive process. The mastoids, middle ears, and imaged paranasal sinuses are clear.  Orbits: No acute abnormality.  Brain: No evidence of acute infarction, hemorrhage, hydrocephalus, or mass lesion/mass effect.  Age normal cerebral volume loss and ischemic gliosis around the lateral ventricles.  IMPRESSION: No acute finding.  Normal head CT for age.   Electronically Signed   By: Marnee Spring M.D.   On: 10/12/2014 06:23   Nm Pulmonary Perf And Vent  10/12/2014   CLINICAL DATA:  Dyspnea.  EXAM: NUCLEAR MEDICINE VENTILATION - PERFUSION LUNG SCAN  TECHNIQUE: Ventilation images were obtained in multiple projections using inhaled aerosol Tc-70m DTPA. Perfusion images were obtained in multiple projections after intravenous injection of Tc-17m MAA.  RADIOPHARMACEUTICALS:  Forty-two Technetium-57m DTPA aerosol inhalation and 5.9 Technetium-106m MAA IV  COMPARISON:  None.  FINDINGS: Ventilation: There is heterogeneous ventilation of both lungs with clumping of the radiopharmaceutical within the large airways.  Perfusion: No wedge shaped peripheral perfusion defects to suggest acute pulmonary embolism.  IMPRESSION: 1. Very low  probability for acute pulmonary embolus.   Electronically Signed   By: Signa Kell M.D.   On: 10/12/2014 11:26   US Venous Img Lower Bilateral  10/12/2014   CLINICAL DATA:  BILATERAL lower extremity swelling  EXAM: BILATERAL LOWER EXTREMITY VENOUS DOPPLER ULTRASOUND  TECHNIQUE: Gray-scale sonography with graded compression, as well as color Doppler and duplex ultrasound were performed to evaluate the lower extremity deep venous systems from the level of the common femoral vein and including the common femoral, femoral, profunda femoral, popliteal and calf veins including the posterior tibial, peroneal and gastrocnemius veins when visible. The superficial great saphenous vein was also interrogated. Spectral Doppler was utilized to evaluate flow at  rest and with distal augmentation maneuvers in the common femoral, femoral and popliteal veins. Limitations of exam secondary to soft tissue swelling at the lower legs bilaterally, body habitus, and patient motion.  COMPARISON:  None  FINDINGS: RIGHT LOWER EXTREMITY  Common Femoral Vein: No evidence of thrombus. Normal compressibility, respiratory phasicity and response to augmentation.  Saphenofemoral Junction: No evidence of thrombus. Normal compressibility and flow on color Doppler imaging.  Profunda Femoral Vein: No evidence of thrombus. Normal compressibility and flow on color Doppler imaging.  Femoral Vein: No evidence of thrombus. Normal compressibility, respiratory phasicity and response to augmentation.  Popliteal Vein: No evidence of thrombus. Normal compressibility, respiratory phasicity and response to augmentation.  Calf Veins: No evidence of thrombus. Normal compressibility and flow on color Doppler imaging.  Superficial Great Saphenous Vein: Limited visualization in the calf secondary to soft tissue swelling. Visualized portion show no evidence of thrombus. Normal compressibility and flow on color Doppler imaging.  Venous Reflux:  None.  Other Findings: Calf soft tissue swelling. Complex collection RIGHT popliteal fossa question complex Baker cyst 6.1 x 1.6 x 3.8 cm.  LEFT LOWER EXTREMITY  Common Femoral Vein: No evidence of thrombus. Normal compressibility, respiratory phasicity and response to augmentation.  Saphenofemoral Junction: No evidence of thrombus. Normal compressibility and flow on color Doppler imaging.  Profunda Femoral Vein: No evidence of thrombus. Normal compressibility and flow on color Doppler imaging.  Femoral Vein: No evidence of thrombus. Normal compressibility, respiratory phasicity and response to augmentation.  Popliteal Vein: No evidence of thrombus. Normal compressibility, respiratory phasicity and response to augmentation.  Calf Veins: No evidence of thrombus. Normal  compressibility and flow on color Doppler imaging.  Superficial Great Saphenous Vein: Limited visualization at the calf due to swelling. Visualized portions show no evidence of thrombus. Normal compressibility and flow on color Doppler imaging.  Venous Reflux:  None.  Other Findings: Soft tissue swelling lower LEFT leg. Complex fluid collection LEFT popliteal fossa question Baker cyst 5.2 x 1.6 x 3.7 cm.  IMPRESSION: No evidence of deep venous thrombosis in either lower extremity.  Complicated collections at the popliteal fossa bilaterally question complicated Baker cysts.  Soft tissue swelling at the calves bilaterally   Electronically Signed   By: [name]   On: 10/12/2014 10:52   Dg Chest Port 1 View  10/12/2014   CLINICAL DATA:  Shortness of breath, extremity swelling, and generalized body aches. Dyspnea.  EXAM: PORTABLE CHEST 1 VIEW  COMPARISON:  Frontal and lateral views 09/29/2014  FINDINGS: Progressive bibasilar airspace disease, likely combination of pleural effusions and atelectasis. Progressive cardiomegaly. Mild pulmonary edema, similar to prior. A skin fold projects over the right upper hemithorax. Chronic deformities noted about both shoulders.  IMPRESSION: Findings suggestive of progressive CHF.   Electronically Signed   By: Shawna Orleans  Ehinger M.D.   On: 10/12/2014 03:42     ECG: NSR with T-wave flattening in the lateral leads.    Impression and Recommendations  1. Acute on Chronic CHF with mild pulmonary edema:  She was found to have CHF on CXR along with LEE. She is feeling some better on the edema, but continues to have dyspnea and wheezing. She has lost 1 lbs since admission. LEE is resolved on exam. Echo demonstrated EF of 60-65%. Recommend daily lasix on discharge for chronic LEE.   2. Hypertension: She is currently on amlodipine with better control and has had low dose metoprolol 25 mg BID. Creatinine is elevated in CKD no ACE or ARB.   3. Anemia: Hgb is decreasing. Would  consider transfusion if indicated to assist with breathing status and CHF. Hemoccult stool.   4. Hx of Dysphagia: Question whether she is able to swallow medications. May need to change to liquid form on discharge.   5. CKD: Likely from longstanding hypertension.   6. Hypercholesterolemia: Continue statin.      Signed: Bettey Mare. Lawrence NP [facility]  10/13/2014, 11:35 AM Co-Sign MD  Patient seen and discussed with NP Lyman Bishop, I agree with her documentation. 81 yo female history of HTN admitted with SOB and hypoxia. She also has noticed increased LE edema over the last several days.  ER vitals p 80 bp 171/85 Wt 193 lbs and stable 08/2014 Echo: LVEF 60-65%, grade I diastolic dysfunction.  [place] LE no DVT. Probable baker cysts D-dimer 3.89, Hgb 7.8, Plt 296, BNP 146 (down from 327 1 month ago), Cr 1.79 (baseline 1.3-1.4), BUN 56, GFR 29,  Trop 0.06-->0.05-->0.05. (1 month ago 0.19).  CXR +pulm edema VQ scan low probability  Acute on chronic diastolic heart failure. Patient is negative 1.1 liters yesterday, she is on lasix  IV bid. Will continue at this time. Renal function stable from admission, continue to monitor. Recent echo last month, will d/c repeat ordered for this admit. She was hypertensive on admision, that may have contributed to her fluid retention. BPs curerntl at goal, continue to follow.   Dominga Ferry MD

## 2014-10-14 ENCOUNTER — Inpatient Hospital Stay (HOSPITAL_COMMUNITY): Payer: Medicare Other

## 2014-10-14 LAB — BASIC METABOLIC PANEL
Anion gap: 7 (ref 5–15)
BUN: 62 mg/dL — AB (ref 6–20)
CO2: 27 mmol/L (ref 22–32)
Calcium: 8.3 mg/dL — ABNORMAL LOW (ref 8.9–10.3)
Chloride: 109 mmol/L (ref 101–111)
Creatinine, Ser: 1.92 mg/dL — ABNORMAL HIGH (ref 0.44–1.00)
GFR calc Af Amer: 27 mL/min — ABNORMAL LOW (ref >60)
GFR, EST NON AFRICAN AMERICAN: 23 mL/min — AB (ref >60)
GLUCOSE: 103 mg/dL — AB (ref 65–99)
POTASSIUM: 4.8 mmol/L (ref 3.5–5.1)
Sodium: 143 mmol/L (ref 135–145)

## 2014-10-14 LAB — URINALYSIS W MICROSCOPIC (NOT AT ARMC)
BILIRUBIN URINE: NEGATIVE
Glucose, UA: NEGATIVE mg/dL
KETONES UR: NEGATIVE mg/dL
NITRITE: NEGATIVE
PROTEIN: 100 mg/dL — AB
Specific Gravity, Urine: >1.03 — ABNORMAL HIGH (ref 1.005–1.030)
Urobilinogen, UA: 0.2 mg/dL (ref 0.0–1.0)
pH: 5.5 (ref 5.0–8.0)

## 2014-10-14 LAB — MRSA PCR SCREENING: MRSA by PCR: NEGATIVE

## 2014-10-14 MED ORDER — FERROUS SULFATE 325 (65 FE) MG PO TABS
325.0000 mg | ORAL_TABLET | Freq: Every day | ORAL | Status: DC
  Administered 2014-10-14 – 2014-10-15 (×2): 325 mg via ORAL
  Filled 2014-10-14 (×2): qty 1

## 2014-10-14 MED ORDER — ALBUTEROL SULFATE (2.5 MG/3ML) 0.083% IN NEBU
INHALATION_SOLUTION | RESPIRATORY_TRACT | Status: AC
  Administered 2014-10-14: 2.5 mg
  Filled 2014-10-14: qty 3

## 2014-10-14 MED ORDER — ALBUTEROL SULFATE (2.5 MG/3ML) 0.083% IN NEBU: 2.5000 mg | INHALATION_SOLUTION | Freq: Once | RESPIRATORY_TRACT | Status: AC

## 2014-10-14 MED ORDER — ALBUTEROL SULFATE (2.5 MG/3ML) 0.083% IN NEBU: 1.2500 mg | INHALATION_SOLUTION | Freq: Two times a day (BID) | RESPIRATORY_TRACT | Status: DC | PRN

## 2014-10-14 MED ORDER — FUROSEMIDE 10 MG/ML IJ SOLN
20.0000 mg | Freq: Once | INTRAMUSCULAR | Status: AC
  Administered 2014-10-14: 20 mg via INTRAVENOUS
  Filled 2014-10-14: qty 2

## 2014-10-14 NOTE — Progress Notes (Signed)
Anemia. Appears to be that of chronic disease patient currently on Feosol 325 by mouth daily iron is also low with low normal TIBC patient less anxious less fearful today physical therapy ongoing for strengthening ambulation EMELIE NEWSOM ZOX:[id number] DOB: 09-Feb-1930 DOA: 10/12/2014 PCP: [name], MD             Physical Exam: Blood pressure 131/54, pulse 61, temperature 98.7 F (37.1 C), temperature source Oral, resp. rate 18, height  (1.626 m), weight 192 lb 15.9 oz (87.541 kg), SpO2 100 %. lungs clear to A&P no rales wheeze rhonchi heart regular rhythm no murmurs, sees feels rubs abdomen soft nontender bowel sounds normoactive   Investigations:  Recent Results (from the past 240 hour(s))  MRSA PCR Screening     Status: None   Collection Time: 10/14/14  2:30 AM  Result Value Ref Range Status   MRSA by PCR NEGATIVE NEGATIVE Final    Comment:        The GeneXpert MRSA Assay (FDA approved for NASAL specimens only), is one component of a comprehensive MRSA colonization surveillance program. It is not intended to diagnose MRSA infection nor to guide or monitor treatment for MRSA infections.      Basic Metabolic Panel:  Recent Labs  81/19/14 0240 10/13/14 0544  NA 143 142  K 4.5 4.7  CL 111 110  CO2 26 27  GLUCOSE 107* 109*  BUN 56* 62*  CREATININE 1.79* 1.76*  CALCIUM 8.2* 8.2*   Liver Function Tests:  Recent Labs  10/12/14 0240 10/13/14 0544  AST 25 30  ALT 21 25  ALKPHOS 96 99  BILITOT 0.4 0.4  PROT 6.2* 6.2*  ALBUMIN 2.4* 2.5*     CBC:  Recent Labs  10/12/14 0240 10/12/14 1153 10/13/14 0544  WBC 5.9 5.4 5.5  NEUTROABS 2.6 2.7  --   HGB 7.8* 7.9* 7.7*  HCT 24.9* 25.2* 24.9*  MCV 102.9* 102.4* 103.3*  PLT 296 297 295    Nm Pulmonary Perf And Vent  10/12/2014   CLINICAL DATA:  Dyspnea.  EXAM: NUCLEAR MEDICINE VENTILATION - PERFUSION LUNG SCAN  TECHNIQUE: Ventilation images were obtained in multiple projections  using inhaled aerosol Tc-47m DTPA. Perfusion images were obtained in multiple projections after intravenous injection of Tc-35m MAA.  RADIOPHARMACEUTICALS:  Forty-two Technetium-44m DTPA aerosol inhalation and 5.9 Technetium-46m MAA IV  COMPARISON:  None.  FINDINGS: Ventilation: There is heterogeneous ventilation of both lungs with clumping of the radiopharmaceutical within the large airways.  Perfusion: No wedge shaped peripheral perfusion defects to suggest acute pulmonary embolism.  IMPRESSION: 1. Very low probability for acute pulmonary embolus.   Electronically Signed   By: Signa Kell M.D.   On: 10/12/2014 11:26   US Venous Img Lower Bilateral  10/12/2014   CLINICAL DATA:  BILATERAL lower extremity swelling  EXAM: BILATERAL LOWER EXTREMITY VENOUS DOPPLER ULTRASOUND  TECHNIQUE: Gray-scale sonography with graded compression, as well as color Doppler and duplex ultrasound were performed to evaluate the lower extremity deep venous systems from the level of the common femoral vein and including the common femoral, femoral, profunda femoral, popliteal and calf veins including the posterior tibial, peroneal and gastrocnemius veins when visible. The superficial great saphenous vein was also interrogated. Spectral Doppler was utilized to evaluate flow at rest and with distal augmentation maneuvers in the common femoral, femoral and popliteal veins. Limitations of exam secondary to soft tissue swelling at the lower legs bilaterally, body habitus, and patient motion.  COMPARISON:  None  FINDINGS: RIGHT LOWER EXTREMITY  Common Femoral Vein: No evidence of thrombus. Normal compressibility, respiratory phasicity and response to augmentation.  Saphenofemoral Junction: No evidence of thrombus. Normal compressibility and flow on color Doppler imaging.  Profunda Femoral Vein: No evidence of thrombus. Normal compressibility and flow on color Doppler imaging.  Femoral Vein: No evidence of thrombus. Normal compressibility,  respiratory phasicity and response to augmentation.  Popliteal Vein: No evidence of thrombus. Normal compressibility, respiratory phasicity and response to augmentation.  Calf Veins: No evidence of thrombus. Normal compressibility and flow on color Doppler imaging.  Superficial Great Saphenous Vein: Limited visualization in the calf secondary to soft tissue swelling. Visualized portion show no evidence of thrombus. Normal compressibility and flow on color Doppler imaging.  Venous Reflux:  None.  Other Findings: Calf soft tissue swelling. Complex collection RIGHT popliteal fossa question complex Baker cyst 6.1 x 1.6 x 3.8 cm.  LEFT LOWER EXTREMITY  Common Femoral Vein: No evidence of thrombus. Normal compressibility, respiratory phasicity and response to augmentation.  Saphenofemoral Junction: No evidence of thrombus. Normal compressibility and flow on color Doppler imaging.  Profunda Femoral Vein: No evidence of thrombus. Normal compressibility and flow on color Doppler imaging.  Femoral Vein: No evidence of thrombus. Normal compressibility, respiratory phasicity and response to augmentation.  Popliteal Vein: No evidence of thrombus. Normal compressibility, respiratory phasicity and response to augmentation.  Calf Veins: No evidence of thrombus. Normal compressibility and flow on color Doppler imaging.  Superficial Great Saphenous Vein: Limited visualization at the calf due to swelling. Visualized portions show no evidence of thrombus. Normal compressibility and flow on color Doppler imaging.  Venous Reflux:  None.  Other Findings: Soft tissue swelling lower LEFT leg. Complex fluid collection LEFT popliteal fossa question Baker cyst 5.2 x 1.6 x 3.7 cm.  IMPRESSION: No evidence of deep venous thrombosis in either lower extremity.  Complicated collections at the popliteal fossa bilaterally question complicated Baker cysts.  Soft tissue swelling at the calves bilaterally   Electronically Signed   By: [name]    On: 10/12/2014 10:52      Medications:   Impression:  Active Problems:   Acute renal failure   Essential hypertension   Elevated troponin   CHF exacerbation   CHF (congestive heart failure), NYHA class IV   Acute on chronic diastolic heart failure     Plan: Await this mornings labs continue physical therapy for strengthening ambulation consider discharge in 24 hours  Consultants:    Procedures   Antibiotics:                   Code Status:   Family Communication:    Disposition Plan see plan above  Time spent: 30 minutes   LOS: 2 days   DONDIEGO,RICHARD M   10/14/2014, 7:12 AM

## 2014-10-14 NOTE — Progress Notes (Addendum)
Primary cardiologist:  Subjective:    Increased SOB this AM. Generalized fatigue. "I feel miserable all over"  Objective:   Temp:  [98.1 F (36.7 C)-98.7 F (37.1 C)] 98.7 F (37.1 C) (09/28 0539) Pulse Rate:  [58-66] 61 (09/28 0539) Resp:  [18] 18 (09/27 2300) BP: (131-149)/(54-68) 131/54 mmHg (09/28 0539) SpO2:  [98 %-100 %] 98 % (09/28 0748) Last BM Date: 10/12/14  Filed Weights   10/12/14 0313 10/12/14 1736  Weight: 193 lb (87.544 kg) 192 lb 15.9 oz (87.541 kg)    Intake/Output Summary (Last 24 hours) at 10/14/14 0918 Last data filed at 10/14/14 0800  Gross per 24 hour  Intake   1200 ml  Output   1050 ml  Net    150 ml    Telemetry: NSR  Exam:  General: NAD  Resp: bilateral wheezing  Cardiac: RRR, no m/r/g, no jvd  GI: abdomen soft, NT, ND  MSK: 2+ bilateral LE edema  Neuro: no focal deficits   Lab Results:  Basic Metabolic Panel:  Recent Labs Lab 10/12/14 0240 10/13/14 0544 10/14/14 0656  NA 143 142 143  K 4.5 4.7 4.8  CL 111 110 109  CO2 GLUCOSE 107* 109* 103*  BUN 56* 62* 62*  CREATININE 1.79* 1.76* 1.92*  CALCIUM 8.2* 8.2* 8.3*    Liver Function Tests:  Recent Labs Lab 10/12/14 0240 10/13/14 0544  AST 25 30  ALT 21 25  ALKPHOS 96 99  BILITOT 0.4 0.4  PROT 6.2* 6.2*  ALBUMIN 2.4* 2.5*    CBC:  Recent Labs Lab 10/12/14 0240 10/12/14 1153 10/13/14 0544  WBC 5.9 5.4 5.5  HGB 7.8* 7.9* 7.7*  HCT 24.9* 25.2* 24.9*  MCV 102.9* 102.4* 103.3*  PLT 296 297 295    Cardiac Enzymes:  Recent Labs Lab 10/12/14 0635 10/12/14 1153 10/12/14 1713  TROPONINI 0.05* 0.05* 0.05*    BNP: No results for input(s): PROBNP in the last 8760 hours.  Coagulation: No results for input(s): INR in the last 168 hours.  ECG:   Medications:   Scheduled Medications: . amLODipine  5 mg Oral Daily  . aspirin EC  81 mg Oral QODAY  . atorvastatin  20 mg Oral Daily  . ferrous sulfate  325 mg Oral Q breakfast  .  fluconazole  100 mg Oral Daily  . furosemide  20 mg Intravenous Q12H  . hydrALAZINE  50 mg Oral 3 times per day  . metoprolol tartrate  25 mg Oral BID  . pantoprazole  40 mg Oral BID     Infusions:     PRN Medications:  acetaminophen, technetium TC 43M diethylenetriame-pentaacetic acid     Assessment/Plan    1. Acute on chronic diastolic HF - 08/2014 Echo: LVEF 60-65%, grade I diastolic dysfunction.  - negative yesterday, negative 1.1 liters since admission. She is on lasix  IV bid, uptrend in Cr. She was hypertensive on admission, that may have contributed to her pulmonary edema. BP's at goal currently.  - still with severe LE edema. Uptrend in Cr, we will continue lasix.   - increased SOB this AM. Feels generalized fatigue. No chest pain. + chills. No fevers overnight, normal WBC. Sats 100% on 3 L, stable pulse and heart rates. Mild increase work of breathing. Noted wheezing on exam. Will order nebulizer treatment for her, repeat CXR. Order EKG. Send off UA.      Dina Rich, M.D   10/14/14 Addendum EKG NSR, no acute  changes. Patient resting comfortably currently. Will f/u CXR. May be a component of anxiety as she has fallen asleep and resting comfortably with normal respiratory effort. When she was awake her main complaint was worries about falling. Primary notes indicate she has been having some anxiety as well. Thus far no objective findings to support acute pathology.   Dominga Ferry MD

## 2014-10-14 NOTE — Progress Notes (Signed)
Patient called out with increased WOB.  Patient's RR 26, HR 72, O2 100% 3L O2.  Lungs sounds diminished with exp wheezes in upper lobes.  Dr. Janna Arch paged.  Dr. Wyline Mood notified on unit.  Dr. Wyline Mood assessed patient.  Orders received for CXR, neb treatment, EKG, and UA.  Radiology in room with patient.  Respiratory notified and on unit.  Will continue to monitor patient.

## 2014-10-15 LAB — BASIC METABOLIC PANEL
Anion gap: 3 — ABNORMAL LOW (ref 5–15)
BUN: 65 mg/dL — AB (ref 6–20)
CHLORIDE: 109 mmol/L (ref 101–111)
CO2: 27 mmol/L (ref 22–32)
CREATININE: 2.14 mg/dL — AB (ref 0.44–1.00)
Calcium: 7.9 mg/dL — ABNORMAL LOW (ref 8.9–10.3)
GFR calc Af Amer: 24 mL/min — ABNORMAL LOW (ref >60)
GFR calc non Af Amer: 20 mL/min — ABNORMAL LOW (ref >60)
GLUCOSE: 102 mg/dL — AB (ref 65–99)
Potassium: 4.9 mmol/L (ref 3.5–5.1)
SODIUM: 139 mmol/L (ref 135–145)

## 2014-10-15 MED ORDER — METOPROLOL TARTRATE 25 MG PO TABS: 25.0000 mg | ORAL_TABLET | Freq: Two times a day (BID) | ORAL | Status: AC

## 2014-10-15 MED ORDER — ASPIRIN EC 81 MG PO TBEC: 81.0000 mg | DELAYED_RELEASE_TABLET | ORAL | Status: AC

## 2014-10-15 NOTE — Discharge Summary (Signed)
Physician Discharge Summary  Joyce Carpenter ZOX:[id number] DOB: 07-Mar-1930 DOA: 10/12/2014  PCP: [name], MD  Admit date: 10/12/2014 Discharge date: 10/15/2014   Recommendations for Outpatient Follow-up:  The patient is discharged back to [facility] nursing facility. Where she is to continue her physical therapy for strengthening and ambulation. It is advised that she wear knee-high compression stockings for venous insufficiency she is hemodynamically stable blood pressures well controlled she should have a B med and 1 weeks time to assess renal function and electrolytes her 2-D echo revealed normal systolic function EF between 60 and 65% with only grade 1 diastolic dysfunction. Some of her dyspnea I believe is related to anxiety. Discharge Diagnoses:  Active Problems:   Acute renal failure   Essential hypertension   Elevated troponin   CHF exacerbation   CHF (congestive heart failure), NYHA class IV   Acute on chronic diastolic heart failure   Discharge Condition: Good and improving  Filed Weights   10/12/14 0313 10/12/14 1736 10/15/14 0743  Weight: 193 lb (87.544 kg) 192 lb 15.9 oz (87.541 kg) 226 lb 3.1 oz (102.6 kg)    History of present illness:  The patient was shaken up and anxious after a near fall with nursing staff CT scan on admission was negative for any acute changes is mildly hypertensive and dyspneic presumably related to anxiety workup for congestive heart failure revealed normal systolic function EF of 60-65% and grade 1 diastolic dysfunction her Lasix was discontinued on the day of discharge she was hemodynamically stable throughout hospital stay family was then patient focused on some pedal edema which is due to venous insufficiency and is recommended she wear compression stockings 3 hours per day knee high and continue her physical therapy for strengthening and ambulation. She likewise had a VQ scan which was alert low probability for pulmonary emboli as  well as a negative venous Doppler both lower extremities revealing no evidence of a DVT venous thrombosis  Hospital Course:  See history of present illness above  Procedures:    Consultations: Cardiology  Discharge Instructions     Medication List    ASK your doctor about these medications        acetaminophen 325 MG tablet  Commonly known as:  TYLENOL  Take 2 tablets (650 mg total) by mouth every 6 (six) hours as needed for mild pain (or Fever >/= 101).     amLODipine 5 MG tablet  Commonly known as:  NORVASC  Take 5 mg by mouth daily.     atorvastatin 20 MG tablet  Commonly known as:  LIPITOR  Take 20 mg by mouth daily.     feeding supplement (PRO-STAT SUGAR FREE 64) Liqd  Take 30 mLs by mouth 2 (two) times daily.     ferrous sulfate 325 (65 FE) MG tablet  Take 325 mg by mouth 3 (three) times daily with meals.     furosemide 40 MG tablet  Commonly known as:  LASIX  Take 40 mg by mouth daily.     hydrALAZINE 50 MG tablet  Commonly known as:  APRESOLINE  Take 1 tablet (50 mg total) by mouth every 8 (eight) hours.     multivitamin with minerals tablet  Take 1 tablet by mouth daily.     ondansetron 4 MG tablet  Commonly known as:  ZOFRAN  Take 1 tablet (4 mg total) by mouth every 6 (six) hours as needed for nausea.     pantoprazole 40 MG tablet  Commonly known as:  PROTONIX  Take 1 tablet (40 mg total) by mouth 2 (two) times daily.     senna 8.6 MG tablet  Commonly known as:  SENOKOT  Take 1 tablet by mouth daily.     vitamin C 500 MG tablet  Commonly known as:  ASCORBIC ACID  Take 500 mg by mouth daily.     VITAMIN D-400 400 UNITS Tabs tablet  Generic drug:  cholecalciferol  Take 400 Units by mouth 2 (two) times daily.       No Known Allergies    The results of significant diagnostics from this hospitalization (including imaging, microbiology, ancillary and laboratory) are listed below for reference.    Significant Diagnostic Studies: Dg  Chest 2 View  09/29/2014   CLINICAL DATA:  Cough, shortness of breath for 5 days.  EXAM: CHEST  2 VIEW  COMPARISON:  September 06, 2014  FINDINGS: The heart size and mediastinal contours are stable. The heart size is enlarged. There is pulmonary edema. There is no focal pneumonia. There are small bilateral pleural effusions. The visualized skeletal structures are stable P  IMPRESSION: Mild congestive heart failure.   Electronically Signed   By: Sherian Rein M.D.   On: 09/29/2014 12:34   Ct Head Wo Contrast  10/12/2014   CLINICAL DATA:  Headache and weakness for 1 day  EXAM: CT HEAD WITHOUT CONTRAST  TECHNIQUE: Contiguous axial images were obtained from the base of the skull through the vertex without intravenous contrast.  COMPARISON:  10/05/2014  FINDINGS: Skull and Sinuses:Negative for fracture or destructive process. The mastoids, middle ears, and imaged paranasal sinuses are clear.  Orbits: No acute abnormality.  Brain: No evidence of acute infarction, hemorrhage, hydrocephalus, or mass lesion/mass effect.  Age normal cerebral volume loss and ischemic gliosis around the lateral ventricles.  IMPRESSION: No acute finding.  Normal head CT for age.   Electronically Signed   By: Marnee Spring M.D.   On: 10/12/2014 06:23   Ct Head Wo Contrast  10/05/2014   CLINICAL DATA:  Headache following closed head injury, initial encounter  EXAM: CT HEAD WITHOUT CONTRAST  TECHNIQUE: Contiguous axial images were obtained from the base of the skull through the vertex without intravenous contrast.  COMPARISON:  None.  FINDINGS: The bony calvarium is intact. The ventricles are of normal size and configuration. No findings to suggest acute hemorrhage, acute infarction or space-occupying mass lesion are noted.  IMPRESSION: No acute intracranial abnormality noted.  These results will be called to the ordering clinician or representative by the Radiologist Assistant, and communication documented in the PACS or zVision Dashboard.    Electronically Signed   By: Alcide Clever M.D.   On: 10/05/2014 14:33   Nm Pulmonary Perf And Vent  10/12/2014   CLINICAL DATA:  Dyspnea.  EXAM: NUCLEAR MEDICINE VENTILATION - PERFUSION LUNG SCAN  TECHNIQUE: Ventilation images were obtained in multiple projections using inhaled aerosol Tc-80m DTPA. Perfusion images were obtained in multiple projections after intravenous injection of Tc-71m MAA.  RADIOPHARMACEUTICALS:  Forty-two Technetium-67m DTPA aerosol inhalation and 5.9 Technetium-36m MAA IV  COMPARISON:  None.  FINDINGS: Ventilation: There is heterogeneous ventilation of both lungs with clumping of the radiopharmaceutical within the large airways.  Perfusion: No wedge shaped peripheral perfusion defects to suggest acute pulmonary embolism.  IMPRESSION: 1. Very low probability for acute pulmonary embolus.   Electronically Signed   By: Signa Kell M.D.   On: 10/12/2014 11:26   US Venous Img Lower Bilateral  10/12/2014   CLINICAL DATA:  BILATERAL lower extremity swelling  EXAM: BILATERAL LOWER EXTREMITY VENOUS DOPPLER ULTRASOUND  TECHNIQUE: Gray-scale sonography with graded compression, as well as color Doppler and duplex ultrasound were performed to evaluate the lower extremity deep venous systems from the level of the common femoral vein and including the common femoral, femoral, profunda femoral, popliteal and calf veins including the posterior tibial, peroneal and gastrocnemius veins when visible. The superficial great saphenous vein was also interrogated. Spectral Doppler was utilized to evaluate flow at rest and with distal augmentation maneuvers in the common femoral, femoral and popliteal veins. Limitations of exam secondary to soft tissue swelling at the lower legs bilaterally, body habitus, and patient motion.  COMPARISON:  None  FINDINGS: RIGHT LOWER EXTREMITY  Common Femoral Vein: No evidence of thrombus. Normal compressibility, respiratory phasicity and response to augmentation.   Saphenofemoral Junction: No evidence of thrombus. Normal compressibility and flow on color Doppler imaging.  Profunda Femoral Vein: No evidence of thrombus. Normal compressibility and flow on color Doppler imaging.  Femoral Vein: No evidence of thrombus. Normal compressibility, respiratory phasicity and response to augmentation.  Popliteal Vein: No evidence of thrombus. Normal compressibility, respiratory phasicity and response to augmentation.  Calf Veins: No evidence of thrombus. Normal compressibility and flow on color Doppler imaging.  Superficial Great Saphenous Vein: Limited visualization in the calf secondary to soft tissue swelling. Visualized portion show no evidence of thrombus. Normal compressibility and flow on color Doppler imaging.  Venous Reflux:  None.  Other Findings: Calf soft tissue swelling. Complex collection RIGHT popliteal fossa question complex Baker cyst 6.1 x 1.6 x 3.8 cm.  LEFT LOWER EXTREMITY  Common Femoral Vein: No evidence of thrombus. Normal compressibility, respiratory phasicity and response to augmentation.  Saphenofemoral Junction: No evidence of thrombus. Normal compressibility and flow on color Doppler imaging.  Profunda Femoral Vein: No evidence of thrombus. Normal compressibility and flow on color Doppler imaging.  Femoral Vein: No evidence of thrombus. Normal compressibility, respiratory phasicity and response to augmentation.  Popliteal Vein: No evidence of thrombus. Normal compressibility, respiratory phasicity and response to augmentation.  Calf Veins: No evidence of thrombus. Normal compressibility and flow on color Doppler imaging.  Superficial Great Saphenous Vein: Limited visualization at the calf due to swelling. Visualized portions show no evidence of thrombus. Normal compressibility and flow on color Doppler imaging.  Venous Reflux:  None.  Other Findings: Soft tissue swelling lower LEFT leg. Complex fluid collection LEFT popliteal fossa question Baker cyst 5.2 x 1.6  x 3.7 cm.  IMPRESSION: No evidence of deep venous thrombosis in either lower extremity.  Complicated collections at the popliteal fossa bilaterally question complicated Baker cysts.  Soft tissue swelling at the calves bilaterally   Electronically Signed   By: [name]   On: 10/12/2014 10:52   Dg Chest Port 1 View  10/14/2014   CLINICAL DATA:  Shortness of breath  EXAM: PORTABLE CHEST 1 VIEW  COMPARISON:  10/12/2014  FINDINGS: Cardiomegaly with mild interstitial edema. Moderate right and small left pleural effusions. No pneumothorax.  IMPRESSION: Cardiomegaly with mild interstitial edema.  Moderate right and small left pleural effusions.   Electronically Signed   By: Charline Bills M.D.   On: 10/14/2014 10:20   Dg Chest Port 1 View  10/12/2014   CLINICAL DATA:  Shortness of breath, extremity swelling, and generalized body aches. Dyspnea.  EXAM: PORTABLE CHEST 1 VIEW  COMPARISON:  Frontal and lateral views 09/29/2014  FINDINGS: Progressive bibasilar airspace disease,  likely combination of pleural effusions and atelectasis. Progressive cardiomegaly. Mild pulmonary edema, similar to prior. A skin fold projects over the right upper hemithorax. Chronic deformities noted about both shoulders.  IMPRESSION: Findings suggestive of progressive CHF.   Electronically Signed   By: Rubye Oaks M.D.   On: 10/12/2014 03:42    Microbiology: Recent Results (from the past 240 hour(s))  MRSA PCR Screening     Status: None   Collection Time: 10/14/14  2:30 AM  Result Value Ref Range Status   MRSA by PCR NEGATIVE NEGATIVE Final    Comment:        The GeneXpert MRSA Assay (FDA approved for NASAL specimens only), is one component of a comprehensive MRSA colonization surveillance program. It is not intended to diagnose MRSA infection nor to guide or monitor treatment for MRSA infections.      Labs: Basic Metabolic Panel:  Recent Labs Lab 10/12/14 0240 10/13/14 0544 10/14/14 0656  10/15/14 0523  NA 143 142 143 139  K 4.5 4.7 4.8 4.9  CL 111 110 109 109  CO2 GLUCOSE 107* 109* 103* 102*  BUN 56* 62* 62* 65*  CREATININE 1.79* 1.76* 1.92* 2.14*  CALCIUM 8.2* 8.2* 8.3* 7.9*   Liver Function Tests:  Recent Labs Lab 10/12/14 0240 10/13/14 0544  AST 25 30  ALT 21 25  ALKPHOS 96 99  BILITOT 0.4 0.4  PROT 6.2* 6.2*  ALBUMIN 2.4* 2.5*   No results for input(s): LIPASE, AMYLASE in the last 168 hours. No results for input(s): AMMONIA in the last 168 hours. CBC:  Recent Labs Lab 10/12/14 0240 10/12/14 1153 10/13/14 0544  WBC 5.9 5.4 5.5  NEUTROABS 2.6 2.7  --   HGB 7.8* 7.9* 7.7*  HCT 24.9* 25.2* 24.9*  MCV 102.9* 102.4* 103.3*  PLT 296 297 295   Cardiac Enzymes:  Recent Labs Lab 10/12/14 0240 10/12/14 0635 10/12/14 1153 10/12/14 1713  TROPONINI 0.06* 0.05* 0.05* 0.05*   BNP: BNP (last 3 results)  Recent Labs  09/06/14 0218 10/12/14 0240  BNP 327.0* 146.0*    ProBNP (last 3 results) No results for input(s): PROBNP in the last 8760 hours.  CBG: No results for input(s): GLUCAP in the last 168 hours.     Signed:  DONDIEGO,RICHARD Judie Petit  [facility] Pager: [phone] 10/15/2014, 12:02 PM

## 2014-10-15 NOTE — Clinical Social Work Note (Addendum)
Pt d/c today back to [facility]. Pt and facility aware and agreeable. D/C summary faxed. Pt to transport via [facility]. CSW tried 3 times to reach pt's sister, Merdis Delay. Left voicemail for pt's sister, [name].   Derenda Fennel, LCSW [phone]

## 2014-10-15 NOTE — Care Management Important Message (Signed)
Important Message  Patient Details  Name: Joyce Carpenter MRN: [id number] Date of Birth: June 24, 1930   Medicare Important Message Given:  Yes-second notification given    Malcolm Metro, RN 10/15/2014, 1:37 PM

## 2014-10-15 NOTE — Care Management Note (Signed)
Case Management Note  Patient Details  Name: ELENE DOWNUM MRN: [id number] Date of Birth: Feb 05, 1930  Expected Discharge Date:       10/15/2014           Expected Discharge Plan:  Skilled Nursing Facility  In-House Referral:  Clinical Social Work  Discharge planning Services  CM Consult  Post Acute Care Choice:  NA Choice offered to:  NA  DME Arranged:    DME Agency:     HH Arranged:    HH Agency:     Status of Service:  Completed, signed off  Medicare Important Message Given:  Yes-second notification given Date Medicare IM Given:    Medicare IM give by:    Date Additional Medicare IM Given:    Additional Medicare Important Message give by:     If discussed at Long Length of Stay Meetings, dates discussed:    Additional Comments: Pt discharging today back to SNF. CSW has arranged for return to facility. No CM needs.  Malcolm Metro, RN 10/15/2014, 1:39 PM

## 2014-10-15 NOTE — Clinical Social Work Note (Signed)
CSW updated [facility] on pt. Facility continues to be willing to accept pt when medically stable.  Derenda Fennel, LCSW [phone]

## 2014-10-15 NOTE — Progress Notes (Signed)
Subjective:    No SOB today  Objective:   Temp:  [98.2 F (36.8 C)-98.9 F (37.2 C)] 98.5 F (36.9 C) (09/29 0530) Pulse Rate:  [58-72] 59 (09/29 0530) Resp:  [20-26] 20 (09/29 0530) BP: (115-145)/(52-68) 115/56 mmHg (09/29 0530) SpO2:  [99 %-100 %] 100 % (09/29 0530) Weight:  [226 lb 3.1 oz (102.6 kg)] 226 lb 3.1 oz (102.6 kg) (09/29 0743) Last BM Date: 10/12/14  Filed Weights   10/12/14 0313 10/12/14 1736 10/15/14 0743  Weight: 193 lb (87.544 kg) 192 lb 15.9 oz (87.541 kg) 226 lb 3.1 oz (102.6 kg)    Intake/Output Summary (Last 24 hours) at 10/15/14 0916 Last data filed at 10/15/14 0500  Gross per 24 hour  Intake    480 ml  Output    450 ml  Net     30 ml    Telemetry: NSR  Exam:  General: NAD  Resp: CTAB  Cardiac: RRR, 3/6 systolic murmur at apex, no JVD  GI: abdomen soft, NT, ND  MSK: 2+ bilateral LE edema  Neuro: no focal deficits   Psych: appropriate affect  Lab Results:  Basic Metabolic Panel:  Recent Labs Lab 10/13/14 0544 10/14/14 0656 10/15/14 0523  NA 142 143 139  K 4.7 4.8 4.9  CL 110 109 109  CO2 GLUCOSE 109* 103* 102*  BUN 62* 62* 65*  CREATININE 1.76* 1.92* 2.14*  CALCIUM 8.2* 8.3* 7.9*    Liver Function Tests:  Recent Labs Lab 10/12/14 0240 10/13/14 0544  AST 25 30  ALT 21 25  ALKPHOS 96 99  BILITOT 0.4 0.4  PROT 6.2* 6.2*  ALBUMIN 2.4* 2.5*    CBC:  Recent Labs Lab 10/12/14 0240 10/12/14 1153 10/13/14 0544  WBC 5.9 5.4 5.5  HGB 7.8* 7.9* 7.7*  HCT 24.9* 25.2* 24.9*  MCV 102.9* 102.4* 103.3*  PLT 296 297 295    Cardiac Enzymes:  Recent Labs Lab 10/12/14 0635 10/12/14 1153 10/12/14 1713  TROPONINI 0.05* 0.05* 0.05*    BNP: No results for input(s): PROBNP in the last 8760 hours.  Coagulation: No results for input(s): INR in the last 168 hours.  ECG:   Medications:   Scheduled Medications: . amLODipine  5 mg Oral Daily  . aspirin EC  81 mg Oral QODAY  . atorvastatin  20  mg Oral Daily  . ferrous sulfate  325 mg Oral Q breakfast  . fluconazole  100 mg Oral Daily  . hydrALAZINE  50 mg Oral 3 times per day  . metoprolol tartrate  25 mg Oral BID  . pantoprazole  40 mg Oral BID     Infusions:     PRN Medications:  acetaminophen, albuterol, technetium TC 32M diethylenetriame-pentaacetic acid     Assessment/Plan   1. Acute on chronic diastolic HF - 08/2014 Echo: LVEF 60-65%, grade I diastolic dysfunction.  - negative 1 liter since admission. She has been on lasix  IV bid, uptrend in Cr, will hold diuretics today. She was hypertensive on admission, that may have contributed to her pulmonary edema. BP's at goal currently. Significant edema also may be secondary to her low albumin and low oncotic pressure given her lingering severe edema, but worsening renal function with diuresis. With AKI will hold diuretics today.  - subjectively increased SOB and work of breathing yesterday AM, however objectively stable findings. She complained mainly of being scared, and something incoherent of scared to fall at a doctors office. After following  up on her 30 minutes later she had fallen asleep and was breathing comfortably. Appears to be some component of anxiety.    2. HTN - bp at goal, continue current meds       Dina Rich, M.D.

## 2014-10-15 NOTE — Progress Notes (Signed)
1650 Patient left via stretcher with EMS non-emergent transport to [facility]. Report on patient called and given to nurse Louie Casa at [facility]. IV catheter to LEFT arm removed, cathter intact, no s/s of infection noted, patient tolerated well w/no c/o pain or discomfort noted. Foley catheter removed, catheter intact, patient tolerated well w/ no c/o pain or discomfort noted, 200cc golden yellow urine noted in catheter bag. Telemetry monitor and wiring removed from patient and central telemetry notified.

## 2014-10-16 LAB — URINE CULTURE

## 2014-10-18 ENCOUNTER — Emergency Department (HOSPITAL_COMMUNITY): Payer: Medicare Other

## 2014-10-18 ENCOUNTER — Inpatient Hospital Stay (HOSPITAL_COMMUNITY)
Admission: EM | Admit: 2014-10-18 | Discharge: 2014-10-23 | DRG: 291 | Disposition: A | Discharge status: Skilled Nursing Facility | Payer: Medicare Other | Attending: Family Medicine | Admitting: Family Medicine

## 2014-10-18 ENCOUNTER — Encounter (HOSPITAL_COMMUNITY): Payer: Self-pay | Admitting: *Deleted

## 2014-10-18 ENCOUNTER — Inpatient Hospital Stay (HOSPITAL_COMMUNITY): Payer: Medicare Other

## 2014-10-18 DIAGNOSIS — Z9114 Patient's other noncompliance with medication regimen: Secondary | ICD-10-CM | POA: Diagnosis not present

## 2014-10-18 DIAGNOSIS — R410 Disorientation, unspecified: Secondary | ICD-10-CM

## 2014-10-18 DIAGNOSIS — R0902 Hypoxemia: Secondary | ICD-10-CM | POA: Diagnosis present

## 2014-10-18 DIAGNOSIS — Z66 Do not resuscitate: Secondary | ICD-10-CM | POA: Diagnosis present

## 2014-10-18 DIAGNOSIS — N179 Acute kidney failure, unspecified: Secondary | ICD-10-CM | POA: Diagnosis present

## 2014-10-18 DIAGNOSIS — D638 Anemia in other chronic diseases classified elsewhere: Secondary | ICD-10-CM | POA: Diagnosis present

## 2014-10-18 DIAGNOSIS — R7989 Other specified abnormal findings of blood chemistry: Secondary | ICD-10-CM | POA: Diagnosis present

## 2014-10-18 DIAGNOSIS — N390 Urinary tract infection, site not specified: Secondary | ICD-10-CM | POA: Diagnosis present

## 2014-10-18 DIAGNOSIS — D509 Iron deficiency anemia, unspecified: Secondary | ICD-10-CM

## 2014-10-18 DIAGNOSIS — I10 Essential (primary) hypertension: Secondary | ICD-10-CM | POA: Diagnosis present

## 2014-10-18 DIAGNOSIS — Z8673 Personal history of transient ischemic attack (TIA), and cerebral infarction without residual deficits: Secondary | ICD-10-CM | POA: Diagnosis not present

## 2014-10-18 DIAGNOSIS — I129 Hypertensive chronic kidney disease with stage 1 through stage 4 chronic kidney disease, or unspecified chronic kidney disease: Secondary | ICD-10-CM | POA: Diagnosis present

## 2014-10-18 DIAGNOSIS — I5030 Unspecified diastolic (congestive) heart failure: Secondary | ICD-10-CM

## 2014-10-18 DIAGNOSIS — E875 Hyperkalemia: Secondary | ICD-10-CM | POA: Diagnosis present

## 2014-10-18 DIAGNOSIS — N183 Chronic kidney disease, stage 3 (moderate): Secondary | ICD-10-CM | POA: Diagnosis present

## 2014-10-18 DIAGNOSIS — E778 Other disorders of glycoprotein metabolism: Secondary | ICD-10-CM | POA: Diagnosis present

## 2014-10-18 DIAGNOSIS — R778 Other specified abnormalities of plasma proteins: Secondary | ICD-10-CM | POA: Diagnosis present

## 2014-10-18 DIAGNOSIS — E8809 Other disorders of plasma-protein metabolism, not elsewhere classified: Secondary | ICD-10-CM | POA: Diagnosis present

## 2014-10-18 DIAGNOSIS — R0600 Dyspnea, unspecified: Secondary | ICD-10-CM | POA: Diagnosis present

## 2014-10-18 DIAGNOSIS — I509 Heart failure, unspecified: Secondary | ICD-10-CM

## 2014-10-18 DIAGNOSIS — Z7982 Long term (current) use of aspirin: Secondary | ICD-10-CM | POA: Diagnosis not present

## 2014-10-18 DIAGNOSIS — G9340 Encephalopathy, unspecified: Secondary | ICD-10-CM | POA: Diagnosis present

## 2014-10-18 DIAGNOSIS — I5033 Acute on chronic diastolic (congestive) heart failure: Secondary | ICD-10-CM | POA: Diagnosis present

## 2014-10-18 DIAGNOSIS — N170 Acute kidney failure with tubular necrosis: Secondary | ICD-10-CM

## 2014-10-18 DIAGNOSIS — R1310 Dysphagia, unspecified: Secondary | ICD-10-CM

## 2014-10-18 LAB — COMPREHENSIVE METABOLIC PANEL
ALK PHOS: 105 U/L (ref 38–126)
ALT: 28 U/L (ref 14–54)
ANION GAP: 7 (ref 5–15)
AST: 29 U/L (ref 15–41)
Albumin: 2.6 g/dL — ABNORMAL LOW (ref 3.5–5.0)
BILIRUBIN TOTAL: 0.4 mg/dL (ref 0.3–1.2)
BUN: 77 mg/dL — ABNORMAL HIGH (ref 6–20)
CALCIUM: 8.2 mg/dL — AB (ref 8.9–10.3)
CO2: 24 mmol/L (ref 22–32)
Chloride: 107 mmol/L (ref 101–111)
Creatinine, Ser: 2.79 mg/dL — ABNORMAL HIGH (ref 0.44–1.00)
GFR, EST AFRICAN AMERICAN: 17 mL/min — AB (ref >60)
GFR, EST NON AFRICAN AMERICAN: 15 mL/min — AB (ref >60)
GLUCOSE: 107 mg/dL — AB (ref 65–99)
Potassium: 5.7 mmol/L — ABNORMAL HIGH (ref 3.5–5.1)
Sodium: 138 mmol/L (ref 135–145)
TOTAL PROTEIN: 6.3 g/dL — AB (ref 6.5–8.1)

## 2014-10-18 LAB — LACTIC ACID, PLASMA
LACTIC ACID, VENOUS: 0.6 mmol/L (ref 0.5–2.0)
Lactic Acid, Venous: 1 mmol/L (ref 0.5–2.0)

## 2014-10-18 LAB — URINE MICROSCOPIC-ADD ON

## 2014-10-18 LAB — CBC WITH DIFFERENTIAL/PLATELET
BASOS ABS: 0 10*3/uL (ref 0.0–0.1)
Basophils Relative: 1 %
Eosinophils Absolute: 0.1 10*3/uL (ref 0.0–0.7)
Eosinophils Relative: 1 %
HEMATOCRIT: 24.7 % — AB (ref 36.0–46.0)
Hemoglobin: 7.7 g/dL — ABNORMAL LOW (ref 12.0–15.0)
LYMPHS ABS: 2.3 10*3/uL (ref 0.7–4.0)
LYMPHS PCT: 37 %
MCH: 32.1 pg (ref 26.0–34.0)
MCHC: 31.2 g/dL (ref 30.0–36.0)
MCV: 102.9 fL — AB (ref 78.0–100.0)
Monocytes Absolute: 0.4 10*3/uL (ref 0.1–1.0)
Monocytes Relative: 6 %
Neutro Abs: 3.5 10*3/uL (ref 1.7–7.7)
Neutrophils Relative %: 55 %
PLATELETS: 259 10*3/uL (ref 150–400)
RBC: 2.4 MIL/uL — AB (ref 3.87–5.11)
RDW: 13.5 % (ref 11.5–15.5)
WBC: 6.3 10*3/uL (ref 4.0–10.5)

## 2014-10-18 LAB — URINALYSIS, ROUTINE W REFLEX MICROSCOPIC
Bilirubin Urine: NEGATIVE
GLUCOSE, UA: NEGATIVE mg/dL
Hgb urine dipstick: NEGATIVE
Ketones, ur: NEGATIVE mg/dL
Nitrite: NEGATIVE
PROTEIN: 100 mg/dL — AB
Specific Gravity, Urine: >1.03 — ABNORMAL HIGH (ref 1.005–1.030)
Urobilinogen, UA: 0.2 mg/dL (ref 0.0–1.0)
pH: 5.5 (ref 5.0–8.0)

## 2014-10-18 LAB — TROPONIN I
Troponin I: 0.04 ng/mL — ABNORMAL HIGH (ref <0.031)
Troponin I: 0.03 ng/mL (ref <0.031)
Troponin I: 0.04 ng/mL — ABNORMAL HIGH (ref <0.031)

## 2014-10-18 LAB — BRAIN NATRIURETIC PEPTIDE: B NATRIURETIC PEPTIDE 5: 503 pg/mL — AB (ref 0.0–100.0)

## 2014-10-18 LAB — POTASSIUM: Potassium: 5.7 mmol/L — ABNORMAL HIGH (ref 3.5–5.1)

## 2014-10-18 MED ORDER — FERROUS SULFATE 325 (65 FE) MG PO TABS
325.0000 mg | ORAL_TABLET | Freq: Three times a day (TID) | ORAL | Status: DC
  Administered 2014-10-18 – 2014-10-23 (×8): 325 mg via ORAL
  Filled 2014-10-18 (×10): qty 1

## 2014-10-18 MED ORDER — ENOXAPARIN SODIUM 30 MG/0.3ML ~~LOC~~ SOLN
30.0000 mg | SUBCUTANEOUS | Status: DC
  Administered 2014-10-18 – 2014-10-23 (×6): 30 mg via SUBCUTANEOUS
  Filled 2014-10-18 (×6): qty 0.3

## 2014-10-18 MED ORDER — HYDRALAZINE HCL 25 MG PO TABS
50.0000 mg | ORAL_TABLET | Freq: Three times a day (TID) | ORAL | Status: DC
  Administered 2014-10-18 – 2014-10-23 (×9): 50 mg via ORAL
  Filled 2014-10-18 (×12): qty 2

## 2014-10-18 MED ORDER — FUROSEMIDE 10 MG/ML IJ SOLN
60.0000 mg | Freq: Two times a day (BID) | INTRAMUSCULAR | Status: DC
  Administered 2014-10-18 – 2014-10-22 (×9): 60 mg via INTRAVENOUS
  Filled 2014-10-18 (×12): qty 6

## 2014-10-18 MED ORDER — ASPIRIN EC 81 MG PO TBEC
81.0000 mg | DELAYED_RELEASE_TABLET | ORAL | Status: DC
  Administered 2014-10-18 – 2014-10-22 (×2): 81 mg via ORAL
  Filled 2014-10-18 (×7): qty 1

## 2014-10-18 MED ORDER — ADULT MULTIVITAMIN W/MINERALS CH
1.0000 | ORAL_TABLET | Freq: Every day | ORAL | Status: DC
  Administered 2014-10-18 – 2014-10-23 (×4): 1 via ORAL
  Filled 2014-10-18 (×7): qty 1

## 2014-10-18 MED ORDER — SODIUM CHLORIDE 0.9 % IJ SOLN
3.0000 mL | INTRAMUSCULAR | Status: DC | PRN
  Administered 2014-10-19: 3 mL via INTRAVENOUS
  Filled 2014-10-18: qty 3

## 2014-10-18 MED ORDER — FUROSEMIDE 10 MG/ML IJ SOLN: 40.0000 mg | Freq: Every day | INTRAMUSCULAR | Status: DC

## 2014-10-18 MED ORDER — SODIUM CHLORIDE 0.9 % IV SOLN: 250.0000 mL | INTRAVENOUS | Status: DC | PRN

## 2014-10-18 MED ORDER — METOPROLOL TARTRATE 25 MG PO TABS
25.0000 mg | ORAL_TABLET | Freq: Two times a day (BID) | ORAL | Status: DC
  Administered 2014-10-18 – 2014-10-23 (×7): 25 mg via ORAL
  Filled 2014-10-18 (×10): qty 1

## 2014-10-18 MED ORDER — ATORVASTATIN CALCIUM 20 MG PO TABS
20.0000 mg | ORAL_TABLET | Freq: Every day | ORAL | Status: DC
  Administered 2014-10-18 – 2014-10-23 (×4): 20 mg via ORAL
  Filled 2014-10-18 (×7): qty 1

## 2014-10-18 MED ORDER — SENNA 8.6 MG PO TABS
1.0000 | ORAL_TABLET | Freq: Every day | ORAL | Status: DC
  Administered 2014-10-18 – 2014-10-23 (×3): 8.6 mg via ORAL
  Filled 2014-10-18 (×11): qty 1

## 2014-10-18 MED ORDER — AMLODIPINE BESYLATE 5 MG PO TABS
5.0000 mg | ORAL_TABLET | Freq: Every day | ORAL | Status: DC
  Administered 2014-10-18 – 2014-10-23 (×4): 5 mg via ORAL
  Filled 2014-10-18 (×7): qty 1

## 2014-10-18 MED ORDER — DEXTROSE 5 % IV SOLN
1.0000 g | INTRAVENOUS | Status: DC
  Administered 2014-10-19 – 2014-10-22 (×4): 1 g via INTRAVENOUS
  Filled 2014-10-18 (×8): qty 10

## 2014-10-18 MED ORDER — PRO-STAT SUGAR FREE PO LIQD
30.0000 mL | Freq: Two times a day (BID) | ORAL | Status: DC
  Administered 2014-10-18 – 2014-10-22 (×7): 30 mL via ORAL
  Filled 2014-10-18 (×10): qty 30

## 2014-10-18 MED ORDER — IPRATROPIUM-ALBUTEROL 0.5-2.5 (3) MG/3ML IN SOLN: 3.0000 mL | Freq: Four times a day (QID) | RESPIRATORY_TRACT | Status: DC | PRN

## 2014-10-18 MED ORDER — SODIUM POLYSTYRENE SULFONATE 15 GM/60ML PO SUSP
30.0000 g | Freq: Once | ORAL | Status: AC
Start: 2014-10-18 — End: 2014-10-18
  Administered 2014-10-18: 30 g via ORAL
  Filled 2014-10-18: qty 120

## 2014-10-18 MED ORDER — ACETAMINOPHEN 325 MG PO TABS
650.0000 mg | ORAL_TABLET | ORAL | Status: DC | PRN
  Administered 2014-10-20: 650 mg via ORAL
  Filled 2014-10-18: qty 2

## 2014-10-18 MED ORDER — FUROSEMIDE 10 MG/ML IJ SOLN
20.0000 mg | Freq: Once | INTRAMUSCULAR | Status: AC
  Administered 2014-10-18: 20 mg via INTRAVENOUS
  Filled 2014-10-18: qty 2

## 2014-10-18 MED ORDER — PANTOPRAZOLE SODIUM 40 MG PO TBEC
40.0000 mg | DELAYED_RELEASE_TABLET | Freq: Two times a day (BID) | ORAL | Status: DC
  Administered 2014-10-18 – 2014-10-23 (×8): 40 mg via ORAL
  Filled 2014-10-18 (×11): qty 1

## 2014-10-18 MED ORDER — SODIUM CHLORIDE 0.9 % IJ SOLN
3.0000 mL | Freq: Two times a day (BID) | INTRAMUSCULAR | Status: DC
  Administered 2014-10-18 – 2014-10-23 (×10): 3 mL via INTRAVENOUS

## 2014-10-18 MED ORDER — ONDANSETRON HCL 4 MG/2ML IJ SOLN: 4.0000 mg | Freq: Four times a day (QID) | INTRAMUSCULAR | Status: DC | PRN

## 2014-10-18 MED ORDER — CHOLECALCIFEROL 10 MCG (400 UNIT) PO TABS
400.0000 [IU] | ORAL_TABLET | Freq: Two times a day (BID) | ORAL | Status: DC
  Administered 2014-10-18 – 2014-10-23 (×8): 400 [IU] via ORAL
  Filled 2014-10-18 (×11): qty 1

## 2014-10-18 MED ORDER — DEXTROSE 5 % IV SOLN
1.0000 g | Freq: Once | INTRAVENOUS | Status: AC
Start: 2014-10-18 — End: 2014-10-18
  Administered 2014-10-18: 1 g via INTRAVENOUS
  Filled 2014-10-18: qty 10

## 2014-10-18 MED ORDER — METOPROLOL TARTRATE 25 MG PO TABS: 25.0000 mg | ORAL_TABLET | Freq: Two times a day (BID) | ORAL | Status: DC

## 2014-10-18 NOTE — ED Notes (Signed)
[facility] called & advised pt being admitted to hospital.

## 2014-10-18 NOTE — Progress Notes (Signed)
Patient has grade 1 diastolic dysfunction with normal systolic function. Had dyspnea off of nasal O2 desaturation currently saturating in the 90s likewise has diminished plasma oncotic pressure with diminished protein and albumen. Creatinine bumped up to 0.79 with elevated potassium of 5.7. Currently on Rocephin for UTI. We'll obtain nephrology consult today. Joyce Carpenter [id number] DOB: March 06, 1930 DOA: 10/18/2014 PCP: Joyce Curet, MD             Physical Exam: Blood pressure 141/58, pulse 56, temperature 98.3 F (36.8 C), temperature source Oral, resp. rate 24, height _0  (1.626 Carpenter), weight 231 lb 4.2 oz (104.9 kg), SpO2 100 %. neck no JVD no carotid bruits no thyromegaly lungs diminished breath sounds at bases no rales appreciable no wheeze no rhonchi heart regular rhythm no S3-S4 no heaves thrills rubs. Extremities 1-2+ chronic pitting edema   Investigations:  Recent Results (from the past 240 hour(s))  Urine culture     Status: None   Collection Time: 10/14/14  2:27 AM  Result Value Ref Range Status   Specimen Description URINE, CLEAN CATCH  Final   Special Requests NONE  Final   Culture   Final    >=100,000 COLONIES/mL ESCHERICHIA COLI Performed at [facility]    Report Status 10/16/2014 FINAL  Final   Organism ID, Bacteria ESCHERICHIA COLI  Final      Susceptibility   Escherichia coli - MIC*    AMPICILLIN 4 SENSITIVE Sensitive     CEFAZOLIN <=4 SENSITIVE Sensitive     CEFTRIAXONE <=1 SENSITIVE Sensitive     CIPROFLOXACIN <=0.25 SENSITIVE Sensitive     GENTAMICIN <=1 SENSITIVE Sensitive     IMIPENEM <=0.25 SENSITIVE Sensitive     NITROFURANTOIN <=16 SENSITIVE Sensitive     TRIMETH/SULFA <=20 SENSITIVE Sensitive     AMPICILLIN/SULBACTAM <=2 SENSITIVE Sensitive     PIP/TAZO <=4 SENSITIVE Sensitive     * >=100,000 COLONIES/mL ESCHERICHIA COLI  MRSA PCR Screening     Status: None   Collection Time: 10/14/14  2:30 AM  Result Value Ref Range  Status   MRSA by PCR NEGATIVE NEGATIVE Final    Comment:        The GeneXpert MRSA Assay (FDA approved for NASAL specimens only), is one component of a comprehensive MRSA colonization surveillance program. It is not intended to diagnose MRSA infection nor to guide or monitor treatment for MRSA infections.      Basic Metabolic Panel:  Recent Labs  10/18/14 0342 10/18/14 0507  NA 138  --   K 5.7* 5.7*  CL 107  --   CO2 24  --   GLUCOSE 107*  --   BUN 77*  --   CREATININE 2.79*  --   CALCIUM 8.2*  --    Liver Function Tests:  Recent Labs  10/18/14 0342  AST 29  ALT 28  ALKPHOS 105  BILITOT 0.4  PROT 6.3*  ALBUMIN 2.6*     CBC:  Recent Labs  10/18/14 0342  WBC 6.3  NEUTROABS 3.5  HGB 7.7*  HCT 24.7*  MCV 102.9*  PLT 259    Dg Chest Portable 1 View  10/18/2014   CLINICAL DATA:  Decreased O2 saturation.  Initial encounter.  EXAM: PORTABLE CHEST 1 VIEW  COMPARISON:  Chest radiograph performed 10/14/2014  FINDINGS: The lungs are well-aerated. Vascular congestion is noted, with bibasilar airspace opacities and small bilateral pleural effusions, likely reflecting pulmonary edema. There is no evidence of pneumothorax.  The cardiomediastinal silhouette is  enlarged. No acute osseous abnormalities are seen.  IMPRESSION: Vascular congestion and cardiomegaly, with bibasilar airspace opacities and small bilateral pleural effusions, reflecting mildly worsened pulmonary edema.   Electronically Signed   By: Joyce Carpenter Carpenter.D.   On: 10/18/2014 03:54      Medications:  Impression: Diminished plasma oncotic pressure due to hypoproteinemia  Principal Problem:   Acute on chronic diastolic heart failure (HCC) Active Problems:   Hypertension   Acute renal failure (HCC)   Essential hypertension   Elevated troponin   Encephalopathy acute   CHF (congestive heart failure) ([place])     Plan: Add ensure 1 can by mouth twice a day due to increased protein. Educational  supply monitor be met daily we'll obtain nephrology consult today continue Lasix as ordered.   Consultants: Nephrology consult requested   Procedures   Antibiotics: Rocephin 1 g IV every 20 4H                  Code Status: DO NOT RESUSCITATE  Family Communication:    Disposition Plan   Time spent: 30 minutes   LOS: 0 days   Joyce Carpenter   10/18/2014, 8:04 AM

## 2014-10-18 NOTE — Progress Notes (Signed)
**Note De-Identified  Obfuscation** RT attempted to instruct patient with Incentive Spirometry x2.  Patient refused to participate. RT to continue to monitor and encourage patient.

## 2014-10-18 NOTE — ED Notes (Signed)
Pt arrived by EMS from [facility]. Per EMS pt has refused to wear her cpap or oxygen tonight. Sats in the 78% on room air. Went to 98% when pt allowed them to place on nasal canula.

## 2014-10-18 NOTE — Progress Notes (Signed)
Patient refused troponin blood draw.  Patient sister Steward Drone present.  Tried to explain to patient, along with sister, that she was in the hospital and needed to have blood drawn to help medical staff find the correct treatment for her issues.  Patient was very clear that she did not want blood drawn and said that nobody could help me now.  Sister continued to try to encourage patient to take blood draws, but she still refused.  Will continue to monitor patient and will attempt to retrieve am blood draws.

## 2014-10-18 NOTE — ED Provider Notes (Signed)
CSN: [id number]     Arrival date & time 10/18/14  0257 History   First MD Initiated Contact with Patient 10/18/14 0315     Chief Complaint  Patient presents with  . Shortness of Breath     (Consider location/radiation/quality/duration/timing/severity/associated sxs/prior Treatment) HPI Comments: Patient from nursing home with shortness of breath and hypoxia tonight. She reportedly was not wearing Her oxygen tonight and became hypoxic at 78% on room air. She then was placed back on her nasal cannula and oxygenation increased to 97%. Patient is uncertain why she was sent to the hospital. She is oriented 2. She denies any chest pain, shortness of breath or abdominal pain. Level V caveat for patient not able to give an accurate history. Recent hospitalization for diastolic heart failure discharged 3 days ago.  The history is provided by the patient and the EMS personnel. The history is limited by the condition of the patient.    Past Medical History  Diagnosis Date  . Hypertension   . Shortness of breath dyspnea    Past Surgical History  Procedure Laterality Date  . Ventral hernia repair N/A 03/25/2014    Procedure: VENTRAL HERIORRHAPHY WITH MESH;  Surgeon: Franky Macho Md, MD;  Location: [facility];  Service: General;  Laterality: N/A;  . Insertion of mesh  03/25/2014    Procedure: INSERTION OF MESH;  Surgeon: Franky Macho Md, MD;  Location: [facility];  Service: General;;  Gaspar Bidding dilation Left 09/09/2014    Procedure: SAVORY DILATION;  Surgeon: Dorena Cookey, MD;  Location: [facility] ENDOSCOPY;  Service: Endoscopy;  Laterality: Left;  . Esophagogastroduodenoscopy (egd) with propofol  09/09/2014    Procedure: ESOPHAGOGASTRODUODENOSCOPY (EGD) WITH PROPOFOL;  Surgeon: Dorena Cookey, MD;  Location: [facility] ENDOSCOPY;  Service: Endoscopy;;   No family history on file. Social History  Substance Use Topics  . Smoking status: Never Smoker   . Smokeless tobacco: None  . Alcohol Use: No   OB History    No data  available     Review of Systems  Unable to perform ROS: Dementia  Respiratory: Positive for shortness of breath.       Allergies  Review of patient's allergies indicates no known allergies.  Home Medications   Prior to Admission medications   Medication Sig Start Date End Date Taking? Authorizing Provider  Amino Acids-Protein Hydrolys (FEEDING SUPPLEMENT, PRO-STAT SUGAR FREE 64,) LIQD Take 30 mLs by mouth 2 (two) times daily.   Yes Historical Provider, MD  amLODipine (NORVASC) 5 MG tablet Take 5 mg by mouth daily.   Yes Historical Provider, MD  atorvastatin (LIPITOR) 20 MG tablet Take 20 mg by mouth daily.   Yes Historical Provider, MD  cholecalciferol (VITAMIN D-400) 400 UNITS TABS tablet Take 400 Units by mouth 2 (two) times daily.   Yes Historical Provider, MD  ferrous sulfate 325 (65 FE) MG tablet Take 325 mg by mouth 3 (three) times daily with meals.    Yes Historical Provider, MD  furosemide (LASIX) 40 MG tablet Take 40 mg by mouth daily.   Yes Historical Provider, MD  hydrALAZINE (APRESOLINE) 50 MG tablet Take 1 tablet (50 mg total) by mouth every 8 (eight) hours. 09/10/14  Yes Alison Murray, MD  ipratropium-albuterol (DUONEB) 0.5-2.5 (3) MG/3ML SOLN Take 3 mLs by nebulization every 6 (six) hours as needed.   Yes Historical Provider, MD  metoprolol tartrate (LOPRESSOR) 25 MG tablet Take 1 tablet (25 mg total) by mouth 2 (two) times daily. 10/15/14  Yes Richard Verizon,  MD  Multiple Vitamins-Minerals (MULTIVITAMIN WITH MINERALS) tablet Take 1 tablet by mouth daily.   Yes Historical Provider, MD  pantoprazole (PROTONIX) 40 MG tablet Take 1 tablet (40 mg total) by mouth 2 (two) times daily. 09/10/14  Yes Alison Murray, MD  senna (SENOKOT) 8.6 MG tablet Take 1 tablet by mouth daily.   Yes Historical Provider, MD  aspirin EC 81 MG tablet Take 1 tablet (81 mg total) by mouth every other day. 10/15/14   Oval Linsey, MD   BP 131/53 mmHg  Pulse 55  Temp(Src) 98.3 F (36.8 C) (Oral)   Resp 18  Wt 226 lb (102.513 kg)  SpO2 100% Physical Exam  Constitutional: She is oriented to person, place, and time. She appears well-developed and well-nourished. No distress.  Morbidly obese  HENT:  Head: Normocephalic and atraumatic.  Mouth/Throat: Oropharynx is clear and moist. No oropharyngeal exudate.  Eyes: Conjunctivae and EOM are normal. Pupils are equal, round, and reactive to light.  Neck: Normal range of motion. Neck supple. JVD present.  No meningismus.  Cardiovascular: Normal rate, regular rhythm, normal heart sounds and intact distal pulses.   No murmur heard. Pulmonary/Chest: Effort normal and breath sounds normal. No respiratory distress.  Tachypnea, bibasilar crackles  Abdominal: Soft. There is no tenderness. There is no rebound and no guarding.  Musculoskeletal: Normal range of motion. She exhibits edema. She exhibits no tenderness.  +2 pedal edema bilaterally  Neurological: She is alert and oriented to person, place, and time. No cranial nerve deficit. She exhibits normal muscle tone. Coordination normal.  Moving all extremities  Skin: Skin is warm.  Psychiatric: She has a normal mood and affect. Her behavior is normal.  Nursing note and vitals reviewed.   ED Course  Procedures (including critical care time) Labs Review Labs Reviewed  CBC WITH DIFFERENTIAL/PLATELET - Abnormal; Notable for the following:    RBC 2.40 (*)    Hemoglobin 7.7 (*)    HCT 24.7 (*)    MCV 102.9 (*)    All other components within normal limits  COMPREHENSIVE METABOLIC PANEL - Abnormal; Notable for the following:    Potassium 5.7 (*)    Glucose, Bld 107 (*)    BUN 77 (*)    Creatinine, Ser 2.79 (*)    Calcium 8.2 (*)    Total Protein 6.3 (*)    Albumin 2.6 (*)    GFR calc non Af Amer 15 (*)    GFR calc Af Amer 17 (*)    All other components within normal limits  TROPONIN I - Abnormal; Notable for the following:    Troponin I 0.04 (*)    All other components within normal  limits  BRAIN NATRIURETIC PEPTIDE - Abnormal; Notable for the following:    B Natriuretic Peptide 503.0 (*)    All other components within normal limits  URINALYSIS, ROUTINE W REFLEX MICROSCOPIC (NOT AT [facility]) - Abnormal; Notable for the following:    APPearance HAZY (*)    Specific Gravity, Urine >1.030 (*)    Protein, ur 100 (*)    Leukocytes, UA TRACE (*)    All other components within normal limits  URINE MICROSCOPIC-ADD ON - Abnormal; Notable for the following:    Squamous Epithelial / LPF FEW (*)    Bacteria, UA MANY (*)    All other components within normal limits  POTASSIUM - Abnormal; Notable for the following:    Potassium 5.7 (*)    All other components within normal limits  Imaging Review Dg Chest Portable 1 View  10/18/2014   CLINICAL DATA:  Decreased O2 saturation.  Initial encounter.  EXAM: PORTABLE CHEST 1 VIEW  COMPARISON:  Chest radiograph performed 10/14/2014  FINDINGS: The lungs are well-aerated. Vascular congestion is noted, with bibasilar airspace opacities and small bilateral pleural effusions, likely reflecting pulmonary edema. There is no evidence of pneumothorax.  The cardiomediastinal silhouette is enlarged. No acute osseous abnormalities are seen.  IMPRESSION: Vascular congestion and cardiomegaly, with bibasilar airspace opacities and small bilateral pleural effusions, reflecting mildly worsened pulmonary edema.   Electronically Signed   By: Roanna Raider M.D.   On: 10/18/2014 03:54   I have personally reviewed and evaluated these images and lab results as part of my medical decision-making.   EKG Interpretation   Date/Time:  Sunday October 18 2014 03:25:15 EDT Ventricular Rate:  58 PR Interval:  150 QRS Duration: 85 QT Interval:  429 QTC Calculation: 421 R Axis:   10 Text Interpretation:  Sinus rhythm No significant change was found  Confirmed by Manus Gunning  MD, Windell Musson ([id number]) on 10/18/2014 3:28:27 AM      MDM   Final diagnoses:  Acute on chronic  diastolic congestive heart failure (HCC)  Acute renal failure, unspecified acute renal failure type (HCC)   From nursing home with shortness of breath, now resolved after reapplying nasal cannula oxygen. No chest pain or abdominal pain.  EKG normal sinus rhythm.  Previous records reviewed 08/2014 Echo: LVEF 60-65%, grade I diastolic dysfunction.  Recent lower extremity Dopplers and VQ scan negative for clot.  X-ray shows worsening vascular congestion and pleural effusions. Stable anemia with worsening creatinine and hyperkalemia. Troponin elevated as previous and decreasing.  Workup consistent with CHF exacerbation with worsening kidney function. IV Lasix given, treat UTI, treat hyperkalemia.  Sister at bedside states patient is more confused than normal. This is likely due to her urinary tract infection. We'll check CT head. Admission d/w Dr. Onalee Hua.  CRITICAL CARE Performed by: Glynn Octave Total critical care time: 30 Critical care time was exclusive of separately billable procedures and treating other patients. Critical care was necessary to treat or prevent imminent or life-threatening deterioration. Critical care was time spent personally by me on the following activities: development of treatment plan with patient and/or surrogate as well as nursing, discussions with consultants, evaluation of patient's response to treatment, examination of patient, obtaining history from patient or surrogate, ordering and performing treatments and interventions, ordering and review of laboratory studies, ordering and review of radiographic studies, pulse oximetry and re-evaluation of patient's condition.   Glynn Octave, MD 10/18/14 [phone]

## 2014-10-18 NOTE — Progress Notes (Signed)
Notified MD on call and patient urinary output.  Patient only has 100cc of urine in foley  From 7a until 2130.  Vitals other wise stable, patient oxygen saturation stable on 2 liters of oxygen.  Notified MD of patient refusal of troponin level.  No new orders received at this time.  Will continue to monitor patient.

## 2014-10-18 NOTE — Consult Note (Signed)
Reason for Consult: Worsening of renal failure Referring Physician: Dr. [name] is an 81 y.o. female.  HPI: She is a patient who has history of hypertension, diastolic dysfunction, chronic renal failure presently was brought because of hypoxemia and confusion. Patient at this moment seems to be alert and answering questions appropriately. She complains of generalized body ache and some back pain. This started when they were moving her into a chair at the nursing home. She states she is feeling better. Her appetite is good and her breathing also has improved.  Past Medical History  Diagnosis Date  . Hypertension   . Shortness of breath dyspnea     Past Surgical History  Procedure Laterality Date  . Ventral hernia repair N/A 03/25/2014    Procedure: VENTRAL HERIORRHAPHY WITH MESH;  Surgeon: Aviva Signs Md, MD;  Location: [facility];  Service: General;  Laterality: N/A;  . Insertion of mesh  03/25/2014    Procedure: INSERTION OF MESH;  Surgeon: Aviva Signs Md, MD;  Location: [facility];  Service: General;;  Azzie Almas dilation Left 09/09/2014    Procedure: SAVORY DILATION;  Surgeon: Teena Irani, MD;  Location: [place];  Service: Endoscopy;  Laterality: Left;  . Esophagogastroduodenoscopy (egd) with propofol  09/09/2014    Procedure: ESOPHAGOGASTRODUODENOSCOPY (EGD) WITH PROPOFOL;  Surgeon: Teena Irani, MD;  Location: [facility] ENDOSCOPY;  Service: Endoscopy;;    No family history on file.  Social History:  reports that she has never smoked. She does not have any smokeless tobacco history on file. She reports that she does not drink alcohol or use illicit drugs.  Allergies: No Known Allergies  Medications: I have reviewed the patient's current medications.  Results for orders placed or performed during the hospital encounter of 10/18/14 (from the past 48 hour(s))  Urinalysis, Routine w reflex microscopic (not at [facility])     Status: Abnormal   Collection Time: 10/18/14  3:37 AM   Result Value Ref Range   Color, Urine YELLOW YELLOW   APPearance HAZY (A) CLEAR   Specific Gravity, Urine >1.030 (H) 1.005 - 1.030   pH 5.5 5.0 - 8.0   Glucose, UA NEGATIVE NEGATIVE mg/dL   Hgb urine dipstick NEGATIVE NEGATIVE   Bilirubin Urine NEGATIVE NEGATIVE   Ketones, ur NEGATIVE NEGATIVE mg/dL   Protein, ur 100 (A) NEGATIVE mg/dL   Urobilinogen, UA 0.2 0.0 - 1.0 mg/dL   Nitrite NEGATIVE NEGATIVE   Leukocytes, UA TRACE (A) NEGATIVE  Urine microscopic-add on     Status: Abnormal   Collection Time: 10/18/14  3:37 AM  Result Value Ref Range   Squamous Epithelial / LPF FEW (A) RARE   WBC, UA 7-10 <3 WBC/hpf   RBC / HPF 0-2 <3 RBC/hpf   Bacteria, UA MANY (A) RARE  CBC with Differential/Platelet     Status: Abnormal   Collection Time: 10/18/14  3:42 AM  Result Value Ref Range   WBC 6.3 4.0 - 10.5 K/uL   RBC 2.40 (L) 3.87 - 5.11 MIL/uL   Hemoglobin 7.7 (L) 12.0 - 15.0 g/dL   HCT 24.7 (L) 36.0 - 46.0 %   MCV 102.9 (H) 78.0 - 100.0 fL   MCH 32.1 26.0 - 34.0 pg   MCHC 31.2 30.0 - 36.0 g/dL   RDW 13.5 11.5 - 15.5 %   Platelets 259 150 - 400 K/uL   Neutrophils Relative % 55 %   Neutro Abs 3.5 1.7 - 7.7 K/uL   Lymphocytes Relative 37 %  Lymphs Abs 2.3 0.7 - 4.0 K/uL   Monocytes Relative 6 %   Monocytes Absolute 0.4 0.1 - 1.0 K/uL   Eosinophils Relative 1 %   Eosinophils Absolute 0.1 0.0 - 0.7 K/uL   Basophils Relative 1 %   Basophils Absolute 0.0 0.0 - 0.1 K/uL  Comprehensive metabolic panel     Status: Abnormal   Collection Time: 10/18/14  3:42 AM  Result Value Ref Range   Sodium 138 135 - 145 mmol/L   Potassium 5.7 (H) 3.5 - 5.1 mmol/L   Chloride 107 101 - 111 mmol/L   CO2 24 22 - 32 mmol/L   Glucose, Bld 107 (H) 65 - 99 mg/dL   BUN 77 (H) 6 - 20 mg/dL   Creatinine, Ser 2.79 (H) 0.44 - 1.00 mg/dL   Calcium 8.2 (L) 8.9 - 10.3 mg/dL   Total Protein 6.3 (L) 6.5 - 8.1 g/dL   Albumin 2.6 (L) 3.5 - 5.0 g/dL   AST 29 15 - 41 U/L   ALT 28 14 - 54 U/L   Alkaline  Phosphatase 105 38 - 126 U/L   Total Bilirubin 0.4 0.3 - 1.2 mg/dL   GFR calc non Af Amer 15 (L) >60 mL/min   GFR calc Af Amer 17 (L) >60 mL/min    Comment: (NOTE) The eGFR has been calculated using the CKD EPI equation. This calculation has not been validated in all clinical situations. eGFR's persistently <60 mL/min signify possible Chronic Kidney Disease.    Anion gap 7 5 - 15  Troponin I     Status: Abnormal   Collection Time: 10/18/14  3:42 AM  Result Value Ref Range   Troponin I 0.04 (H) <0.031 ng/mL    Comment:        PERSISTENTLY INCREASED TROPONIN VALUES IN THE RANGE OF 0.04-0.49 ng/mL CAN BE SEEN IN:       -UNSTABLE ANGINA       -CONGESTIVE HEART FAILURE       -MYOCARDITIS       -CHEST TRAUMA       -ARRYHTHMIAS       -LATE PRESENTING MYOCARDIAL INFARCTION       -COPD   CLINICAL FOLLOW-UP RECOMMENDED.   Brain natriuretic peptide     Status: Abnormal   Collection Time: 10/18/14  3:42 AM  Result Value Ref Range   B Natriuretic Peptide 503.0 (H) 0.0 - 100.0 pg/mL  Potassium     Status: Abnormal   Collection Time: 10/18/14  5:07 AM  Result Value Ref Range   Potassium 5.7 (H) 3.5 - 5.1 mmol/L  Troponin I     Status: None   Collection Time: 10/18/14  7:48 AM  Result Value Ref Range   Troponin I 0.03 <0.031 ng/mL    Comment:        NO INDICATION OF MYOCARDIAL INJURY.   Lactic acid, plasma     Status: None   Collection Time: 10/18/14  7:48 AM  Result Value Ref Range   Lactic Acid, Venous 0.6 0.5 - 2.0 mmol/L    Dg Chest Portable 1 View  10/18/2014   CLINICAL DATA:  Decreased O2 saturation.  Initial encounter.  EXAM: PORTABLE CHEST 1 VIEW  COMPARISON:  Chest radiograph performed 10/14/2014  FINDINGS: The lungs are well-aerated. Vascular congestion is noted, with bibasilar airspace opacities and small bilateral pleural effusions, likely reflecting pulmonary edema. There is no evidence of pneumothorax.  The cardiomediastinal silhouette is enlarged. No acute osseous  abnormalities are seen.  IMPRESSION: Vascular congestion and cardiomegaly, with bibasilar airspace opacities and small bilateral pleural effusions, reflecting mildly worsened pulmonary edema.   Electronically Signed   By: Garald Balding M.D.   On: 10/18/2014 03:54    Review of Systems  Constitutional: Negative for fever and chills.  Respiratory: Negative for shortness of breath.   Cardiovascular: Positive for leg swelling. Negative for chest pain and orthopnea.  Gastrointestinal: Positive for heartburn. Negative for nausea and vomiting.  Musculoskeletal: Positive for joint pain and neck pain.  Neurological: Positive for weakness.   Blood pressure 141/58, pulse 56, temperature 98.3 F (36.8 C), temperature source Oral, resp. rate 24, height 5' 4"  (1.626 m), weight 231 lb 4.2 oz (104.9 kg), SpO2 100 %. Physical Exam  Constitutional: No distress.  Eyes: No scleral icterus.  Neck: No JVD present.  Cardiovascular: Normal rate and regular rhythm.   Murmur heard. Respiratory: No respiratory distress. She has no wheezes. She has no rales.  Musculoskeletal: She exhibits edema.  Neurological: She is alert.    Assessment/Plan: Problem #1 difficulty breathing and hypoxemia. Presently patient seems to be feeling better. Patient at this moment is on oxygen which she refused in the nursing home. Problem #2 history of diastolic dysfunction/CHF. Presently patient with leg edema but she is asymptomatic. Problem #3 hyperkalemia Problem #4 renal failure: Seems to be acute on chronic. Patient with stage III chronic renal failure thought to be secondary to hypertension, age-related renal function loss. Problem #5 hypertension: Her blood pressure is reasonably controlled Problem #6 anemia: Her hemoglobin is low. Problem #7 history of TIA Plan: We'll start patient on Lasix 60 mg IV twice a day We'll do iron studies We'll check her basic metabolic panel and phosphorus in the morning. We'll put patient on  low potassium diet.  Jeffren Dombek S 10/18/2014, 9:37 AM

## 2014-10-18 NOTE — H&P (Signed)
PCP:   Joyce Stalling, Joyce Carpenter   Chief Complaint:  hypoxia  HPI: 81 yo female h/o diastolic chf, ckd baseline cr around 1.7, CRF on 2 liters, htn lives in SNF and is bedbound sent in tonight because patient would not wear her oxygen and her sats were dropping into the 70s.  Ems was called, they convinced her to wear her oxygen and now her o2 sats are good in the 90s.  Pt is more confused than normal, per sister who is present and providing all the history.  Pt does not have dementia, and normally mentates normal.  But right now she is confused and not acting right.  She denies any pain, she just says she is cold.  She says she is sob which she always is.  Per SNF records she is taking her medications.  No report of fever, sister not aware of any.  She is significantly volume overloaded and has worsening renal funtcion.  She was just discharged here 2 days ago by her pcp dr Joyce Carpenter.  Referred for admission for her acute chf and worsening renal function.    Review of Systems:  Unobtainable due to pt ams  Past Medical History: Past Medical History  Diagnosis Date  . Hypertension   . Shortness of breath dyspnea    Past Surgical History  Procedure Laterality Date  . Ventral hernia repair N/A 03/25/2014    Procedure: VENTRAL HERIORRHAPHY WITH MESH;  Surgeon: Joyce Macho Md, Joyce Carpenter;  Location: [facility];  Service: General;  Laterality: N/A;  . Insertion of mesh  03/25/2014    Procedure: INSERTION OF MESH;  Surgeon: Joyce Macho Md, Joyce Carpenter;  Location: [facility];  Service: General;;  Joyce Carpenter dilation Left 09/09/2014    Procedure: SAVORY DILATION;  Surgeon: Joyce Cookey, Joyce Carpenter;  Location: [facility] ENDOSCOPY;  Service: Endoscopy;  Laterality: Left;  . Esophagogastroduodenoscopy (egd) with propofol  09/09/2014    Procedure: ESOPHAGOGASTRODUODENOSCOPY (EGD) WITH PROPOFOL;  Surgeon: Joyce Cookey, Joyce Carpenter;  Location: [facility] ENDOSCOPY;  Service: Endoscopy;;    Medications: Prior to Admission medications   Medication Sig Start Date End  Date Taking? Authorizing Provider  Amino Acids-Protein Hydrolys (FEEDING SUPPLEMENT, PRO-STAT SUGAR FREE 64,) LIQD Take 30 mLs by mouth 2 (two) times daily.   Yes Historical Provider, Joyce Carpenter  amLODipine (NORVASC) 5 MG tablet Take 5 mg by mouth daily.   Yes Historical Provider, Joyce Carpenter  atorvastatin (LIPITOR) 20 MG tablet Take 20 mg by mouth daily.   Yes Historical Provider, Joyce Carpenter  cholecalciferol (VITAMIN D-400) 400 UNITS TABS tablet Take 400 Units by mouth 2 (two) times daily.   Yes Historical Provider, Joyce Carpenter  ferrous sulfate 325 (65 FE) MG tablet Take 325 mg by mouth 3 (three) times daily with meals.    Yes Historical Provider, Joyce Carpenter  furosemide (LASIX) 40 MG tablet Take 40 mg by mouth daily.   Yes Historical Provider, Joyce Carpenter  hydrALAZINE (APRESOLINE) 50 MG tablet Take 1 tablet (50 mg total) by mouth every 8 (eight) hours. 09/10/14  Yes Joyce Murray, Joyce Carpenter  ipratropium-albuterol (DUONEB) 0.5-2.5 (3) MG/3ML SOLN Take 3 mLs by nebulization every 6 (six) hours as needed.   Yes Historical Provider, Joyce Carpenter  metoprolol tartrate (LOPRESSOR) 25 MG tablet Take 1 tablet (25 mg total) by mouth 2 (two) times daily. 10/15/14  Yes Joyce Linsey, Joyce Carpenter  Multiple Vitamins-Minerals (MULTIVITAMIN WITH MINERALS) tablet Take 1 tablet by mouth daily.   Yes Historical Provider, Joyce Carpenter  pantoprazole (PROTONIX) 40 MG tablet Take 1 tablet (40 mg total) by  mouth 2 (two) times daily. 09/10/14  Yes Joyce Murray, Joyce Carpenter  senna (SENOKOT) 8.6 MG tablet Take 1 tablet by mouth daily.   Yes Historical Provider, Joyce Carpenter  aspirin EC 81 MG tablet Take 1 tablet (81 mg total) by mouth every other day. 10/15/14   Joyce Linsey, Joyce Carpenter    Allergies:  No Known Allergies  Social History:  reports that she has never smoked. She does not have any smokeless tobacco history on file. She reports that she does not drink alcohol or use illicit drugs.  Family History: Unobtainable from pt due to ams  Physical Exam: Filed Vitals:   10/18/14 0300 10/18/14 0302 10/18/14 0430 10/18/14  0532  BP: 124/48 124/48 130/96 131/53  Pulse: 61 59 58 55  Temp:  98.3 F (36.8 C)    TempSrc:  Oral    Resp:  24  18  SpO2: 97% 97% 100% 100%   General appearance: alert, cooperative and no distress Head: Normocephalic, without obvious abnormality, atraumatic Eyes: negative Nose: Nares normal. Septum midline. Mucosa normal. No drainage or sinus tenderness. Neck: no JVD and supple, symmetrical, trachea midline Lungs: rales bilaterally Heart: regular rate and rhythm, S1, S2 normal, no murmur, click, rub or gallop Abdomen: soft, non-tender; bowel sounds normal; no masses,  no organomegaly Extremities: edema 2+ ble pitting edema Pulses: 2+ and symmetric Skin: Skin color, texture, turgor normal. No rashes or lesions Neurologic: Grossly normal confused, follows simple commands, not oriented to time     Labs on Admission:   Recent Labs  10/18/14 0342  NA 138  K 5.7*  CL 107  CO2 24  GLUCOSE 107*  BUN 77*  CREATININE 2.79*  CALCIUM 8.2*    Recent Labs  10/18/14 0342  AST 29  ALT 28  ALKPHOS 105  BILITOT 0.4  PROT 6.3*  ALBUMIN 2.6*    Recent Labs  10/18/14 0342  WBC 6.3  NEUTROABS 3.5  HGB 7.7*  HCT 24.7*  MCV 102.9*  PLT 259    Recent Labs  10/18/14 0342  TROPONINI 0.04*    Radiological Exams on Admission:  Dg Chest Portable 1 View  10/18/2014   CLINICAL DATA:  Decreased O2 saturation.  Initial encounter.  EXAM: PORTABLE CHEST 1 VIEW  COMPARISON:  Chest radiograph performed 10/14/2014  FINDINGS: The lungs are well-aerated. Vascular congestion is noted, with bibasilar airspace opacities and small bilateral pleural effusions, likely reflecting pulmonary edema. There is no evidence of pneumothorax.  The cardiomediastinal silhouette is enlarged. No acute osseous abnormalities are seen.  IMPRESSION: Vascular congestion and cardiomegaly, with bibasilar airspace opacities and small bilateral pleural effusions, reflecting mildly worsened pulmonary edema.    Electronically Signed   By: Joyce Carpenter M.D.   On: 10/18/2014 03:54   Old records reviewed Case discussed with dr Joyce Carpenter in ED cxr reviewed worsening edema  Assessment/Plan  81 yo female with acute on chronic diastolic chf exacerbation along with acute on chronic kidney injury  Principal Problem:   Acute on chronic diastolic heart failure (HCC)-  Pt volume overloaded.  Will place on iv lasix  iv daily, do not think she will be able to tolerate much more aggressive diuresis than that with her worsening renal function.  Place on chf pathway, will not repeat any echo at this time.  Place foley catheter for accurate uop monitoring.  Will need to adjust lasix daily pending on what her renal function does.  Active Problems:   Hypertension- stable   Acute renal failure (HCC)-  Also with uti, place on rocephin.  Check lactic acid level.  Monitor cr closely with above chf treatment   Essential hypertension- stable   Elevated troponin- chronically elevated   Encephalopathy acute-  H/o TIA, will also do stat ct head however her mental status changes are likely due to her worsening heart and renal failure, plus infection.  But certainly high risk for CVA/TIA.  Admit to tele bed.  Presumptive full code.  PCP is dr Joyce Carpenter.    Joyce Carpenter A 10/18/2014, 5:46 AM

## 2014-10-19 LAB — BASIC METABOLIC PANEL
Anion gap: 7 (ref 5–15)
BUN: 81 mg/dL — AB (ref 6–20)
CALCIUM: 7.9 mg/dL — AB (ref 8.9–10.3)
CHLORIDE: 107 mmol/L (ref 101–111)
CO2: 25 mmol/L (ref 22–32)
CREATININE: 3.32 mg/dL — AB (ref 0.44–1.00)
GFR calc non Af Amer: 12 mL/min — ABNORMAL LOW (ref >60)
GFR, EST AFRICAN AMERICAN: 14 mL/min — AB (ref >60)
GLUCOSE: 97 mg/dL (ref 65–99)
Potassium: 5 mmol/L (ref 3.5–5.1)
Sodium: 139 mmol/L (ref 135–145)

## 2014-10-19 LAB — TROPONIN I: Troponin I: 0.04 ng/mL — ABNORMAL HIGH (ref <0.031)

## 2014-10-19 LAB — CREATININE, URINE, RANDOM: CREATININE, URINE: 129.96 mg/dL

## 2014-10-19 LAB — IRON AND TIBC
IRON: 23 ug/dL — AB (ref 28–170)
SATURATION RATIOS: 11 % (ref 10.4–31.8)
TIBC: 213 ug/dL — AB (ref 250–450)
UIBC: 190 ug/dL

## 2014-10-19 LAB — SODIUM, URINE, RANDOM: Sodium, Ur: 25 mmol/L

## 2014-10-19 LAB — FERRITIN: Ferritin: 106 ng/mL (ref 11–307)

## 2014-10-19 LAB — PHOSPHORUS: PHOSPHORUS: 5.9 mg/dL — AB (ref 2.5–4.6)

## 2014-10-19 MED ORDER — SODIUM CHLORIDE 0.9 % IV SOLN
INTRAVENOUS | Status: DC
  Administered 2014-10-19 – 2014-10-22 (×7): via INTRAVENOUS

## 2014-10-19 NOTE — Progress Notes (Signed)
Appreciate nephrology consult patient currently on Lasix 60 twice a day also lower extremity compression devices for venous insufficiency and is hemodynamically stable no dyspnea reported Joyce Carpenter DQQ:[id number] DOB: 1930-08-06 DOA: 10/18/2014 PCP: Maricela Curet, MD             Physical Exam: Blood pressure 124/52, pulse 58, temperature 98.4 F (36.9 C), temperature source Oral, resp. rate 18, height $RemoveBe'5\' 4"'fVVrfvSoA$  (1.626 m), weight 231 lb 4.2 oz (104.9 kg), SpO2 100 %. no JVD no carotid bruits no thyromegaly lungs diminished breath sounds at bases no rales wheeze rhonchi appreciable heart regular rhythm no S3-S4 no heaves thrills rubs extremities 1+ chronic pitting edema   Investigations:  Recent Results (from the past 240 hour(s))  Urine culture     Status: None   Collection Time: 10/14/14  2:27 AM  Result Value Ref Range Status   Specimen Description URINE, CLEAN CATCH  Final   Special Requests NONE  Final   Culture   Final    >=100,000 COLONIES/mL ESCHERICHIA COLI Performed at [facility]    Report Status 10/16/2014 FINAL  Final   Organism ID, Bacteria ESCHERICHIA COLI  Final      Susceptibility   Escherichia coli - MIC*    AMPICILLIN 4 SENSITIVE Sensitive     CEFAZOLIN <=4 SENSITIVE Sensitive     CEFTRIAXONE <=1 SENSITIVE Sensitive     CIPROFLOXACIN <=0.25 SENSITIVE Sensitive     GENTAMICIN <=1 SENSITIVE Sensitive     IMIPENEM <=0.25 SENSITIVE Sensitive     NITROFURANTOIN <=16 SENSITIVE Sensitive     TRIMETH/SULFA <=20 SENSITIVE Sensitive     AMPICILLIN/SULBACTAM <=2 SENSITIVE Sensitive     PIP/TAZO <=4 SENSITIVE Sensitive     * >=100,000 COLONIES/mL ESCHERICHIA COLI  MRSA PCR Screening     Status: None   Collection Time: 10/14/14  2:30 AM  Result Value Ref Range Status   MRSA by PCR NEGATIVE NEGATIVE Final    Comment:        The GeneXpert MRSA Assay (FDA approved for NASAL specimens only), is one component of a comprehensive MRSA  colonization surveillance program. It is not intended to diagnose MRSA infection nor to guide or monitor treatment for MRSA infections.      Basic Metabolic Panel:  Recent Labs  10/18/14 0342 10/18/14 0507  NA 138  --   K 5.7* 5.7*  CL 107  --   CO2 24  --   GLUCOSE 107*  --   BUN 77*  --   CREATININE 2.79*  --   CALCIUM 8.2*  --    Liver Function Tests:  Recent Labs  10/18/14 0342  AST 29  ALT 28  ALKPHOS 105  BILITOT 0.4  PROT 6.3*  ALBUMIN 2.6*     CBC:  Recent Labs  10/18/14 0342  WBC 6.3  NEUTROABS 3.5  HGB 7.7*  HCT 24.7*  MCV 102.9*  PLT 259    Dg Chest Portable 1 View  10/18/2014   CLINICAL DATA:  Decreased O2 saturation.  Initial encounter.  EXAM: PORTABLE CHEST 1 VIEW  COMPARISON:  Chest radiograph performed 10/14/2014  FINDINGS: The lungs are well-aerated. Vascular congestion is noted, with bibasilar airspace opacities and small bilateral pleural effusions, likely reflecting pulmonary edema. There is no evidence of pneumothorax.  The cardiomediastinal silhouette is enlarged. No acute osseous abnormalities are seen.  IMPRESSION: Vascular congestion and cardiomegaly, with bibasilar airspace opacities and small bilateral pleural effusions, reflecting mildly worsened pulmonary edema.   Electronically  Signed   By: Garald Balding M.D.   On: 10/18/2014 03:54      Medications:   Impression:  Principal Problem:   Acute on chronic diastolic heart failure (HCC) Active Problems:   Hypertension   Acute renal failure (HCC)   Essential hypertension   Elevated troponin   Encephalopathy acute   CHF (congestive heart failure) (HCC)     Plan: Continue ensure 1 can twice a day for hypoproteinemia. Continue Lopressor 25 by mouth twice a day and other antihypertensives. Await labs ordered by nephrology this morning not back yet. Be met daily monitor electrolytes and creatinine   Consultants: Nephrology   Procedures   Antibiotics: Rocephin                   Code Status: DO NOT RESUSCITATE  Family Communication:    Disposition Plan see plan above  Time spent: 30 minutes   LOS: 1 day   Joyce Carpenter M   10/19/2014, 6:13 AM

## 2014-10-19 NOTE — Clinical Social Work Note (Signed)
Clinical Social Work Assessment  Patient Details  Name: Joyce Carpenter MRN: [id number] Date of Birth: [birth year]  Date of referral:  10/19/14               Reason for consult:  Facility Placement                Permission sought to share information with:    Permission granted to share information::     Name::        Agency::     Relationship::     Contact Information:     Housing/Transportation Living arrangements for the past 2 months:  Skilled Building surveyor of Information:  Other (Comment Required) (Sister) Patient Interpreter Needed:  None Criminal Activity/Legal Involvement Pertinent to Current Situation/Hospitalization:  No - Comment as needed Significant Relationships:  Siblings Lives with:  Facility Resident Do you feel safe going back to the place where you live?  Yes Need for family participation in patient care:  Yes (Comment)  Care giving concerns:  Pt long term resident at [facility].    Social Worker assessment / plan:  CSW attempted to reach [place], but unable. CSW spoke with pt's sister, Joyce Carpenter who lives in [place]. Pt has been a resident at [facility] for about a month. She d/c back to [facility] at the end of the week and returned yesterday more confused and volume overloaded. Pt lived with [facility] prior to placement. Per Eunice Blase at facility, pt is skilled level of care and okay to return. Eunice Blase indicates pt has not made much progress with PT, so long term plan is unclear at this point. Joyce Carpenter confirms plan to return to [facility] when medically stable.   Employment status:  Retired Database administrator PT Recommendations:  Not assessed at this time Information / Referral to community resources:  Other (Comment Required) (Return to [facility])  Patient/Family's Response to care:  Family requests return to [facility] at d/c.   Patient/Family's Understanding of and Emotional Response to Diagnosis, Current Treatment, and Prognosis:  [name], Joyce Carpenter  concerned about medical issues and return to hospital and plans to follow up with RN today.   Emotional Assessment Appearance:  Appears stated age Attitude/Demeanor/Rapport:  Unable to Assess Affect (typically observed):  Unable to Assess Orientation:  Oriented to Self Alcohol / Substance use:  Not Applicable Psych involvement (Current and /or in the community):  No (Comment)  Discharge Needs  Concerns to be addressed:  Discharge Planning Concerns Readmission within the last 30 days:  Yes Current discharge risk:  Chronically ill Barriers to Discharge:  Continued Medical Work up   Karn Cassis, LCSW 10/19/2014, 9:21 AM [phone]

## 2014-10-19 NOTE — Progress Notes (Signed)
Subjective: Patient seems to be somewhat confused and asking me to close the schutter which was already closed. When I told her it was already closed patient couldn't believe me. She said she'Carpenter feeling okay. She denies any difficulty breathing.   Objective: Vital signs in last 24 hours: Temp:  [97.9 F (36.6 C)-98.5 F (36.9 C)] 98.4 F (36.9 C) (10/03 0501) Pulse Rate:  [51-58] 58 (10/03 0501) Resp:  [18-22] 18 (10/03 0501) BP: (124-135)/(47-58) 124/52 mmHg (10/03 0501) SpO2:  [97 %-100 %] 100 % (10/03 0501) Weight:  [236 lb 5.3 oz (107.2 kg)] 236 lb 5.3 oz (107.2 kg) (10/03 0600)  Intake/Output from previous day: 10/02 0701 - 10/03 0700 In: 886 [P.O.:886] Out: 150 [Urine:150] Intake/Output this shift:     Recent Labs  10/18/14 0342  HGB 7.7*    Recent Labs  10/18/14 0342  WBC 6.3  RBC 2.40*  HCT 24.7*  PLT 259    Recent Labs  10/18/14 0342 10/18/14 0507 10/19/14 0649  NA 138  --  139  K 5.7* 5.7* 5.0  CL 107  --  107  CO2 24  --  25  BUN 77*  --  81*  CREATININE 2.79*  --  3.32*  GLUCOSE 107*  --  97  CALCIUM 8.2*  --  7.9*   No results for input(Carpenter): LABPT, INR in the last 72 hours.  Patient is alert and in no apparent distress. Seems to be confused. And was not willing to be examined initially. Chest: Decreased breath sound Heart exam regular rate and rhythm Extremities no edema  Assessment/Plan: Problem #1 acute kidney injury: BUN and creatinine presently increasing. Possibly prerenal versus ATN. Problem #2 hyperkalemia: Her potassium has corrected Problem #3 anemia: Her hemoglobin has declined significantly. Possibly iron deficiency anemia and anemia of chronic disease Problem #4 history of CHF: The patient doesn't seem to have significant sign of fluid overload. She is on Lasix her urine output is not that much. Problem #5 metabolic bone disease her calcium is within acceptable range however her phosphorus is high. Problem #6 hypertension: Her  blood pressure is reasonably controlled Problem #7 confusion/encephalopathy. At this moment for sure whether patient has dementia. Plan: We'll start patient on normal saline at 125 mL per hour 2] we'll check her iron studies, basic metabolic panel in the morning.   Joyce Carpenter 10/19/2014, 8:02 AM

## 2014-10-19 NOTE — Progress Notes (Signed)
10/19/2014 10:41 AM   Pt refused all oral meds this morning (800 and 1000).  Was able to give pt IV Lasix.  Attempted education, but pt is confused and not receptive to teaching.   Bradly Chris, RN

## 2014-10-19 NOTE — Progress Notes (Addendum)
Patient continues to refuse PO medicines as well as IV lasix this evening.  States "they don't help so why should I take them".  Explained importance of them and why she is here in the hospital, still refused

## 2014-10-19 NOTE — Care Management Note (Signed)
Case Management Note  Patient Details  Name: Joyce Carpenter MRN: [id number] Date of Birth: Nov 11, 1930  Subjective/Objective:                  Pt admitted from [facility] with CHF. Anticipate pt Joyce return to facility at discharge.  Action/Plan: CSW is aware and Joyce arrange discharge to facility when medically stable.  Expected Discharge Date:                  Expected Discharge Plan:  Skilled Nursing Facility  In-House Referral:  Clinical Social Work  Discharge planning Services  CM Consult  Post Acute Care Choice:  NA Choice offered to:  NA  DME Arranged:    DME Agency:     HH Arranged:    HH Agency:     Status of Service:  Completed, signed off  Medicare Important Message Given:    Date Medicare IM Given:    Medicare IM give by:    Date Additional Medicare IM Given:    Additional Medicare Important Message give by:     If discussed at Long Length of Stay Meetings, dates discussed:    Additional Comments:  Cheryl Flash, RN 10/19/2014, 12:28 PM

## 2014-10-20 LAB — BASIC METABOLIC PANEL
ANION GAP: 6 (ref 5–15)
BUN: 86 mg/dL — ABNORMAL HIGH (ref 6–20)
CALCIUM: 7.8 mg/dL — AB (ref 8.9–10.3)
CO2: 25 mmol/L (ref 22–32)
Chloride: 108 mmol/L (ref 101–111)
Creatinine, Ser: 3.56 mg/dL — ABNORMAL HIGH (ref 0.44–1.00)
GFR calc Af Amer: 13 mL/min — ABNORMAL LOW (ref >60)
GFR, EST NON AFRICAN AMERICAN: 11 mL/min — AB (ref >60)
Glucose, Bld: 121 mg/dL — ABNORMAL HIGH (ref 65–99)
Potassium: 4.9 mmol/L (ref 3.5–5.1)
Sodium: 139 mmol/L (ref 135–145)

## 2014-10-20 MED ORDER — FERROUS SULFATE 325 (65 FE) MG PO TABS
325.0000 mg | ORAL_TABLET | Freq: Every day | ORAL | Status: DC
  Administered 2014-10-21 – 2014-10-22 (×2): 325 mg via ORAL
  Filled 2014-10-20 (×3): qty 1

## 2014-10-20 NOTE — Progress Notes (Signed)
Patient refused all PO meds again this morning.  Reiterated the importance of them again as well.  Patient did allow me to give IV lasix and SQ enoxaprin, though.  Will continue to encourage taking all meds.

## 2014-10-20 NOTE — Progress Notes (Signed)
Patient does have baseline dementia which fluctuates with superimposed delirium at times she denies any dyspnea orthopnea creatinine is increasing gradually appreciate nephrology notes patient refusing some medicines as she refused her oxygen in skilled nursing facility the patient is a DO NOT RESUSCITATE Joyce Carpenter XBJ:[id number] DOB: [birth year] DOA: 10/18/2014 PCP: [name], MD             Physical Exam: Blood pressure 162/59, pulse 72, temperature 98.6 F (37 C), temperature source Oral, resp. rate 20, height  (1.626 m), weight 235 lb 7.2 oz (106.8 kg), SpO2 100 %. lungs show diminished breath sounds in bases no rales wheeze or rhonchi appreciable heart regular rhythm no S3 or S4 no heaves thrills rubs abdomen obese soft nontender bowel sounds normoactive there is 2+ chronic pitting edema she has compression devices for venous insufficiency   Investigations:  Recent Results (from the past 240 hour(s))  Urine culture     Status: None   Collection Time: 10/14/14  2:27 AM  Result Value Ref Range Status   Specimen Description URINE, CLEAN CATCH  Final   Special Requests NONE  Final   Culture   Final    >=100,000 COLONIES/mL ESCHERICHIA COLI Performed at [facility]    Report Status 10/16/2014 FINAL  Final   Organism ID, Bacteria ESCHERICHIA COLI  Final      Susceptibility   Escherichia coli - MIC*    AMPICILLIN 4 SENSITIVE Sensitive     CEFAZOLIN <=4 SENSITIVE Sensitive     CEFTRIAXONE <=1 SENSITIVE Sensitive     CIPROFLOXACIN <=0.25 SENSITIVE Sensitive     GENTAMICIN <=1 SENSITIVE Sensitive     IMIPENEM <=0.25 SENSITIVE Sensitive     NITROFURANTOIN <=16 SENSITIVE Sensitive     TRIMETH/SULFA <=20 SENSITIVE Sensitive     AMPICILLIN/SULBACTAM <=2 SENSITIVE Sensitive     PIP/TAZO <=4 SENSITIVE Sensitive     * >=100,000 COLONIES/mL ESCHERICHIA COLI  MRSA PCR Screening     Status: None   Collection Time: 10/14/14  2:30 AM  Result Value Ref Range  Status   MRSA by PCR NEGATIVE NEGATIVE Final    Comment:        The GeneXpert MRSA Assay (FDA approved for NASAL specimens only), is one component of a comprehensive MRSA colonization surveillance program. It is not intended to diagnose MRSA infection nor to guide or monitor treatment for MRSA infections.      Basic Metabolic Panel:  Recent Labs  30/86/57 0649 10/20/14 0623  NA 139 139  K 5.0 4.9  CL 107 108  CO2 25 25  GLUCOSE 97 121*  BUN 81* 86*  CREATININE 3.32* 3.56*  CALCIUM 7.9* 7.8*  PHOS 5.9*  --    Liver Function Tests:  Recent Labs  10/18/14 0342  AST 29  ALT 28  ALKPHOS 105  BILITOT 0.4  PROT 6.3*  ALBUMIN 2.6*     CBC:  Recent Labs  10/18/14 0342  WBC 6.3  NEUTROABS 3.5  HGB 7.7*  HCT 24.7*  MCV 102.9*  PLT 259    No results found.    Medications:   Impression: Anemia of chronic disease  Principal Problem:   Acute on chronic diastolic heart failure (HCC) Active Problems:   Hypertension   Acute renal failure (HCC)   Essential hypertension   Elevated troponin   Encephalopathy acute   CHF (congestive heart failure) (HCC)     Plan: Continue ensure 1 can twice a day increase protein and albumen monitor  hemoglobin and hematocrit as well as renal function patient has been made a DO NOT RESUSCITATE by me again on this admission prognosis is poor patient is refusing certain treatments   Consultants: Nephrology   Procedures   Antibiotics:                   Code Status: DO NOT RESUSCITATE  Family Communication:    Disposition Plan see plan above`  Time spent: 30 minutes   LOS: 2 days   Dalaney Needle M   10/20/2014, 12:07 PM

## 2014-10-20 NOTE — Clinical Documentation Improvement (Signed)
Internal Medicine  Based on the clinical indicators below, please provide a diagnosis associated with findings along with acuity. Please document findings in next progress note and include in discharge summary if applicable.   Other  Clinically Undetermined  Supporting Information:  Patient desatted to 78% at NH due to refusing to wear O2 cannula  When placed in 2, 2.5L, sats rose to 98 - 100%  Respiratory rates ranging from 16 to 24 for current admit  Please exercise your independent, professional judgment when responding. A specific answer is not anticipated or expected.  Thank You, Shellee Milo BSN, RN Health Information Management [place] [phone]

## 2014-10-20 NOTE — Plan of Care (Signed)
Problem: Phase I Progression Outcomes Goal: Voiding-avoid urinary catheter unless indicated Outcome: Not Progressing Foley remains in place d/t IV diuresis

## 2014-10-20 NOTE — Progress Notes (Signed)
Subjective: Patient offers no complaint . Patient refused to take her medication and when I asked her she told me it does not help her. She agree to take it after she finished eating her break fast.    Objective: Vital signs in last 24 hours: Temp:  [98.2 F (36.8 C)-98.6 F (37 C)] 98.6 F (37 C) (10/04 7829) Pulse Rate:  [60-72] 72 (10/04 0642) Resp:  [18-22] 20 (10/04 0642) BP: (122-162)/(50-59) 162/59 mmHg (10/04 0642) SpO2:  [100 %] 100 % (10/04 0738) Weight:  [235 lb 7.2 oz (106.8 kg)] 235 lb 7.2 oz (106.8 kg) (10/04 5621)  Intake/Output from previous day: 10/03 0701 - 10/04 0700 In: 2890 [P.O.:840; I.V.:2000; IV Piggyback:50] Out: 600 [Urine:600] Intake/Output this shift:     Recent Labs  10/18/14 0342  HGB 7.7*    Recent Labs  10/18/14 0342  WBC 6.3  RBC 2.40*  HCT 24.7*  PLT 259    Recent Labs  10/19/14 0649 10/20/14 0623  NA 139 139  K 5.0 4.9  CL 107 108  CO2 25 25  BUN 81* 86*  CREATININE 3.32* 3.56*  GLUCOSE 97 121*  CALCIUM 7.9* 7.8*   No results for input(s): LABPT, INR in the last 72 hours.  Patient is alert and in no apparent distress. Seems to be confused. And was not willing to be examined initially. Chest: Decreased breath sound Heart exam regular rate and rhythm Extremities no edema  Assessment/Plan: Problem #1 acute kidney injury: BUN and creatinine presently increasing. Possibly prerenal versus ATN. Problem #2 hyperkalemia: Her potassium is normal Problem #3 anemia: Her hemoglobin has declined significantly. Possibly iron deficiency anemia and anemia of chronic disease Problem #4 history of CHF: The patient doesn't seem to have significant sign of fluid overload. Presently she refused to take her lasix and her urine out put remains low Problem #5 metabolic bone disease her calcium is within acceptable range however her phosphorus is high. Problem #6 hypertension: Her blood pressure is reasonably controlled Problem #7  confusion/encephalopathy. Fluctuating but seems better today Plan: We'll decrease her ivf to 100 cc/hr 2] we'll , basic metabolic panel in the morning. 3] Patient's code status may need to be address as patient is refusing to take her medication and no sign of improvement.   Howie Rufus S 10/20/2014, 7:59 AM

## 2014-10-21 ENCOUNTER — Encounter: Payer: Self-pay | Admitting: Nurse Practitioner

## 2014-10-21 ENCOUNTER — Ambulatory Visit: Payer: Medicare Other | Admitting: Nurse Practitioner

## 2014-10-21 ENCOUNTER — Telehealth: Payer: Self-pay | Admitting: Nurse Practitioner

## 2014-10-21 LAB — BASIC METABOLIC PANEL
ANION GAP: 7 (ref 5–15)
BUN: 83 mg/dL — ABNORMAL HIGH (ref 6–20)
CALCIUM: 7.9 mg/dL — AB (ref 8.9–10.3)
CO2: 25 mmol/L (ref 22–32)
Chloride: 108 mmol/L (ref 101–111)
Creatinine, Ser: 3.42 mg/dL — ABNORMAL HIGH (ref 0.44–1.00)
GFR, EST AFRICAN AMERICAN: 13 mL/min — AB (ref >60)
GFR, EST NON AFRICAN AMERICAN: 12 mL/min — AB (ref >60)
Glucose, Bld: 103 mg/dL — ABNORMAL HIGH (ref 65–99)
POTASSIUM: 4.7 mmol/L (ref 3.5–5.1)
SODIUM: 140 mmol/L (ref 135–145)

## 2014-10-21 MED ORDER — DEXTROSE 5 % IV SOLN
1.0000 g | INTRAVENOUS | Status: DC
  Filled 2014-10-21 (×2): qty 10

## 2014-10-21 NOTE — Progress Notes (Signed)
Creatinine 3.56 no complaints of dyspnea attempted to contact family member yesterday unsuccessful no answer we'll discuss hospice care with SNF combined patient has multisystemic decompensation prognosis poor she is currently a DO NOT RESUSCITATE Joyce Carpenter ZOX:[id number] DOB: [birth year] DOA: 10/18/2014 PCP: [name], MD             Physical Exam: Blood pressure 149/53, pulse 67, temperature 98.6 F (37 C), temperature source Oral, resp. rate 20, height  (1.626 m), weight 236 lb 15.9 oz (107.5 kg), SpO2 99 %. no JVD no carotid bruits no thyromegaly lungs diminished breath sounds in bases no rales wheeze rhonchi appreciable heart regular rhythm no S3-S4 no heaves thrills rubs abdomen soft nontender bowel sounds normoactive no guarding or rebound extremities 1+ chronic pitting edema subcutaneous compression pneumatic devices in place   Investigations:  Recent Results (from the past 240 hour(s))  Urine culture     Status: None   Collection Time: 10/14/14  2:27 AM  Result Value Ref Range Status   Specimen Description URINE, CLEAN CATCH  Final   Special Requests NONE  Final   Culture   Final    >=100,000 COLONIES/mL ESCHERICHIA COLI Performed at [facility]    Report Status 10/16/2014 FINAL  Final   Organism ID, Bacteria ESCHERICHIA COLI  Final      Susceptibility   Escherichia coli - MIC*    AMPICILLIN 4 SENSITIVE Sensitive     CEFAZOLIN <=4 SENSITIVE Sensitive     CEFTRIAXONE <=1 SENSITIVE Sensitive     CIPROFLOXACIN <=0.25 SENSITIVE Sensitive     GENTAMICIN <=1 SENSITIVE Sensitive     IMIPENEM <=0.25 SENSITIVE Sensitive     NITROFURANTOIN <=16 SENSITIVE Sensitive     TRIMETH/SULFA <=20 SENSITIVE Sensitive     AMPICILLIN/SULBACTAM <=2 SENSITIVE Sensitive     PIP/TAZO <=4 SENSITIVE Sensitive     * >=100,000 COLONIES/mL ESCHERICHIA COLI  MRSA PCR Screening     Status: None   Collection Time: 10/14/14  2:30 AM  Result Value Ref Range Status   MRSA by PCR NEGATIVE NEGATIVE Final    Comment:        The GeneXpert MRSA Assay (FDA approved for NASAL specimens only), is one component of a comprehensive MRSA colonization surveillance program. It is not intended to diagnose MRSA infection nor to guide or monitor treatment for MRSA infections.      Basic Metabolic Panel:  Recent Labs  81/19/14 0649 10/20/14 0623  NA 139 139  K 5.0 4.9  CL 107 108  CO2 25 25  GLUCOSE 97 121*  BUN 81* 86*  CREATININE 3.32* 3.56*  CALCIUM 7.9* 7.8*  PHOS 5.9*  --    Liver Function Tests: No results for input(s): AST, ALT, ALKPHOS, BILITOT, PROT, ALBUMIN in the last 72 hours.   CBC: No results for input(s): WBC, NEUTROABS, HGB, HCT, MCV, PLT in the last 72 hours.  No results found.    Medications:   Impression:  Principal Problem:   Acute on chronic diastolic heart failure (HCC) Active Problems:   Hypertension   Acute renal failure (HCC)   Essential hypertension   Elevated troponin   Encephalopathy acute   CHF (congestive heart failure) (HCC)     Plan: Rocephin 1 g IV every 24 hours monitor renal function will discuss hospice combined with's SNF with family members. Patient is DO NOT RESUSCITATE  Consultants: Nephrology   Procedures   Antibiotics:  Code Status: DO NOT RESUSCITATE   Family Communication:  We'll attempt to call sister again today  Disposition Plan see plan above  Time spent: 30 minutes   LOS: 3 days   Derryl Uher M   10/21/2014, 6:39 AM

## 2014-10-21 NOTE — Progress Notes (Signed)
Joyce Carpenter  MRN: [id number]  DOB/AGE: 1930-11-17 81 y.o.  Primary Care Physician:Joyce M, MD  Admit date: 10/18/2014  Chief Complaint:  Chief Complaint  Patient presents with  . Shortness of Breath    S-Pt presented on  10/18/2014 with  Chief Complaint  Patient presents with  . Shortness of Breath  .    Pt does not offer no new complaints.   Meds . amLODipine  5 mg Oral Daily  . aspirin EC  81 mg Oral QODAY  . atorvastatin  20 mg Oral Daily  . cefTRIAXone (ROCEPHIN)  IV  1 g Intravenous Q24H  . cefTRIAXone (ROCEPHIN)  IV  1 g Intravenous Q24H  . cholecalciferol  400 Units Oral BID  . enoxaparin (LOVENOX) injection  30 mg Subcutaneous Q24H  . feeding supplement (PRO-STAT SUGAR FREE 64)  30 mL Oral BID  . ferrous sulfate  325 mg Oral TID WC  . ferrous sulfate  325 mg Oral Q breakfast  . furosemide  60 mg Intravenous BID  . hydrALAZINE  50 mg Oral 3 times per day  . metoprolol tartrate  25 mg Oral BID  . multivitamin with minerals  1 tablet Oral Daily  . pantoprazole  40 mg Oral BID  . senna  1 tablet Oral Daily  . sodium chloride  3 mL Intravenous Q12H     Physical Exam: Vital signs in last 24 hours: Temp:  [98.1 F (36.7 C)-98.6 F (37 C)] 98.6 F (37 C) (10/05 8295) Pulse Rate:  [67-80] 67 (10/05 0608) Resp:  [20] 20 (10/04 2059) BP: (149-175)/(53-60) 149/53 mmHg (10/05 0608) SpO2:  [98 %-100 %] 99 % (10/05 0608) Weight:  [236 lb 15.9 oz (107.5 kg)] 236 lb 15.9 oz (107.5 kg) (10/05 6213) Weight change: 1 lb 8.7 oz (0.7 kg) Last BM Date: 10/19/14  Intake/Output from previous day: 10/04 0701 - 10/05 0700 In: 480 [P.O.:480] Out: 1200 [Urine:1200] Total I/O In: 600 [P.O.:600] Out: -    Physical Exam: General- pt is awake,alert, follows commands intermittently. Resp- No acute REsp distress, CTA B/L NO Rhonchi CVS- S1S2 regular in rate and rhythm. GIT- BS +, soft, NT, ND. EXT- NO LE Edema, NO Cyanosis   Lab Results: Hemoglobin &  Hematocrit     Component Value Date/Time   HGB 7.7* 10/18/2014 0342   HCT 24.7* 10/18/2014 0342        BMET  Recent Labs  10/20/14 0623 10/21/14 0639  NA 139 140  K 4.9 4.7  CL 108 108  CO2 25 25  GLUCOSE 121* 103*  BUN 86* 83*  CREATININE 3.56* 3.42*  CALCIUM 7.8* 7.9*   Creat trend 2016 2.79=>3.56=>3.42          2.95=>1.47 ( March admission) 2015  1.39   MICRO Recent Results (from the past 240 hour(s))  Urine culture     Status: None   Collection Time: 10/14/14  2:27 AM  Result Value Ref Range Status   Specimen Description URINE, CLEAN CATCH  Final   Special Requests NONE  Final   Culture   Final    >=100,000 COLONIES/mL ESCHERICHIA COLI Performed at [facility]    Report Status 10/16/2014 FINAL  Final   Organism ID, Bacteria ESCHERICHIA COLI  Final      Susceptibility   Escherichia coli - MIC*    AMPICILLIN 4 SENSITIVE Sensitive     CEFAZOLIN <=4 SENSITIVE Sensitive     CEFTRIAXONE <=1 SENSITIVE Sensitive     CIPROFLOXACIN <=  0.25 SENSITIVE Sensitive     GENTAMICIN <=1 SENSITIVE Sensitive     IMIPENEM <=0.25 SENSITIVE Sensitive     NITROFURANTOIN <=16 SENSITIVE Sensitive     TRIMETH/SULFA <=20 SENSITIVE Sensitive     AMPICILLIN/SULBACTAM <=2 SENSITIVE Sensitive     PIP/TAZO <=4 SENSITIVE Sensitive     * >=100,000 COLONIES/mL ESCHERICHIA COLI  MRSA PCR Screening     Status: None   Collection Time: 10/14/14  2:30 AM  Result Value Ref Range Status   MRSA by PCR NEGATIVE NEGATIVE Final    Comment:        The GeneXpert MRSA Assay (FDA approved for NASAL specimens only), is one component of a comprehensive MRSA colonization surveillance program. It is not intended to diagnose MRSA infection nor to guide or monitor treatment for MRSA infections.       Lab Results  Component Value Date   CALCIUM 7.9* 10/21/2014   PHOS 5.9* 10/19/2014   Albumin 2.6 Corrected Calcium 7.9+ 1.2=>9.1     Impression: 1)Renal  AKI secondary to  Prerenal/ATN               AKI at plateau               Creat stable               AKI on  CKD               CKD stage 3.               CKD since not sure                CKD secondary to HTN/ Age associated decline/Multiple AKI                Progression of CKD marked with AKI                2)HTN BP stable   3)Anemia HGb low Primary MD following for need of transfusion   4)CKD Mineral-Bone Disorder Phosphorus not at goal. Calcium at goal when corrected for  Low albumin   5)Resp- admitted with Hypoxia Primary MD following  6)Electrolytes Normokalemic NOrmonatremic   7)Acid base Co2 at goal     Plan:  Will continue current care.      [name] S 10/21/2014, 10:13 AM

## 2014-10-21 NOTE — Telephone Encounter (Signed)
Noted  

## 2014-10-21 NOTE — Telephone Encounter (Signed)
PATIENT WAS A NO SHOW AND LETTER SENT  °

## 2014-10-22 NOTE — Progress Notes (Signed)
Upon shift change, nurse reported that pt has not been receiving iv fluids. Pt on aggressive iv diuresis. Pt fluids restarted per order. Will continue to monitor pt.

## 2014-10-22 NOTE — Progress Notes (Signed)
Subjective: No nes complaint but states not feeling much better   Objective: Vital signs in last 24 hours: Temp:  [98.6 F (37 C)] 98.6 F (37 C) (10/06 0641) Pulse Rate:  [62-66] 64 (10/06 0641) Resp:  [20] 20 (10/06 0641) BP: (133-146)/(56-60) 146/60 mmHg (10/06 0641) SpO2:  [99 %-100 %] 100 % (10/06 0641) Weight:  [242 lb 15.2 oz (110.2 kg)] 242 lb 15.2 oz (110.2 kg) (10/06 0641)  Intake/Output from previous day: 10/05 0701 - 10/06 0700 In: 5850 [P.O.:600; I.V.:5150; IV Piggyback:100] Out: 675 [Urine:675] Intake/Output this shift:    No results for input(s): HGB in the last 72 hours. No results for input(s): WBC, RBC, HCT, PLT in the last 72 hours.  Recent Labs  10/20/14 0623 10/21/14 0639  NA 139 140  K 4.9 4.7  CL 108 108  CO2 25 25  BUN 86* 83*  CREATININE 3.56* 3.42*  GLUCOSE 121* 103*  CALCIUM 7.8* 7.9*   No results for input(s): LABPT, INR in the last 72 hours.  Patient is alert and in no apparent distress. Seems to be confused. And was not willing to be examined initially. Chest: Decreased breath sound Heart exam regular rate and rhythm Extremities no edema  Assessment/Plan: Problem #1 acute kidney injury:. Possibly prerenal versus ATN. Her renal function is slowly improving .  Problem #2 hyperkalemia: Her potassium is normal Problem #3 anemia: Her hemoglobin has declined significantly. Possibly from her renal failure. Her iron saturation is much better than befroe Problem #4 history of CHF: The patient doesn't seem to have significant sign of fluid overload.  She is none oliguric Problem #5 metabolic bone disease her calcium is within acceptable range however her phosphorus is slightly high. Not on a binder Problem #6 hypertension: Her blood pressure is reasonably controlled Problem #7 confusion/encephalopathy. Fluctuating but seems better today Plan: 1] Continue with hydration 2]  basic metabolic panel in the morning.    Batina Dougan  S 10/22/2014, 8:16 AM

## 2014-10-22 NOTE — Progress Notes (Signed)
We will call to her sister and her niece as yet unanswered she was at work to discuss palliative care or hospice upon return to assess SNF reddened and minimally improved awaiting be met in a.m. no evidence of intravascular volume overload  Joyce Carpenter WGN:[id number] DOB: 03-08-1930 DOA: 10/18/2014 PCP: Maricela Curet, MD             Physical Exam: Blood pressure 141/51, pulse 68, temperature 98.6 F (37 C), temperature source Oral, resp. rate 20, height 5' 4"  (1.626 m), weight 242 lb 15.2 oz (110.2 kg), SpO2 100 %. neck no JVD no carotid bruits no thyromegaly lungs diminished breath sounds to bases no rales wheeze rhonchi appreciable heart regular rhythm no S3 or S4 no heaves feels rubs abdomen soft nontender bowel sounds normoactive   Investigations:  Recent Results (from the past 240 hour(s))  Urine culture     Status: None   Collection Time: 10/14/14  2:27 AM  Result Value Ref Range Status   Specimen Description URINE, CLEAN CATCH  Final   Special Requests NONE  Final   Culture   Final    >=100,000 COLONIES/mL ESCHERICHIA COLI Performed at [facility]    Report Status 10/16/2014 FINAL  Final   Organism ID, Bacteria ESCHERICHIA COLI  Final      Susceptibility   Escherichia coli - MIC*    AMPICILLIN 4 SENSITIVE Sensitive     CEFAZOLIN <=4 SENSITIVE Sensitive     CEFTRIAXONE <=1 SENSITIVE Sensitive     CIPROFLOXACIN <=0.25 SENSITIVE Sensitive     GENTAMICIN <=1 SENSITIVE Sensitive     IMIPENEM <=0.25 SENSITIVE Sensitive     NITROFURANTOIN <=16 SENSITIVE Sensitive     TRIMETH/SULFA <=20 SENSITIVE Sensitive     AMPICILLIN/SULBACTAM <=2 SENSITIVE Sensitive     PIP/TAZO <=4 SENSITIVE Sensitive     * >=100,000 COLONIES/mL ESCHERICHIA COLI  MRSA PCR Screening     Status: None   Collection Time: 10/14/14  2:30 AM  Result Value Ref Range Status   MRSA by PCR NEGATIVE NEGATIVE Final    Comment:        The GeneXpert MRSA Assay (FDA approved for NASAL  specimens only), is one component of a comprehensive MRSA colonization surveillance program. It is not intended to diagnose MRSA infection nor to guide or monitor treatment for MRSA infections.      Basic Metabolic Panel:  Recent Labs  10/20/14 0623 10/21/14 0639  NA 139 140  K 4.9 4.7  CL 108 108  CO2 25 25  GLUCOSE 121* 103*  BUN 86* 83*  CREATININE 3.56* 3.42*  CALCIUM 7.8* 7.9*   Liver Function Tests: No results for input(s): AST, ALT, ALKPHOS, BILITOT, PROT, ALBUMIN in the last 72 hours.   CBC: No results for input(s): WBC, NEUTROABS, HGB, HCT, MCV, PLT in the last 72 hours.  No results found.    Medications:   Impression:  Principal Problem:   Acute on chronic diastolic heart failure (HCC) Active Problems:   Hypertension   Acute renal failure (HCC)   Essential hypertension   Elevated troponin   Encephalopathy acute   CHF (congestive heart failure) ([place])     Plan: Be met in a.m. Will discuss with sister Oris Drone possible hospice care to prevent return to hospital once return to SNF to 2 multisystemic failure  Consultants:    Procedures   Antibiotics:                   Code  Status: DO NOT RESUSCITATE  Family Communication:  Attempted to call sister Oris Drone with no reply yet  Disposition Plan  Time spent: 30 minutes   LOS: 4 days   Jahnay Lantier M   10/22/2014, 12:28 PM

## 2014-10-23 NOTE — Progress Notes (Signed)
MD phoned and stated to leave pt's iv out and ordered pt a Hospice consult upon discharge to SNF.

## 2014-10-23 NOTE — Discharge Summary (Signed)
Physician Discharge Summary  Joyce Carpenter WUJ:[id number] DOB: 08-23-1930 DOA: 10/18/2014  PCP: [name], MD  Admit date: 10/18/2014 Discharge date: 10/23/2014   Recommendations for Outpatient Follow-up:  The patient is discharged to [facility] skilled nursing facility with a hospice consult for multisystemic end-stage failure chronic dementia acute on chronic renal failure which is worsening chronic grade 1 diastolic dysfunction dyspnea etiology not certain right extensive workups anxiety to be managed to skilled nursing facility in hopes that hospice will preside over her expected demise. The goal is not to return to Joyce Carpenter to the hospital as I explained to her abysmal prognosis to her family members and attempted to explain to the patient herself who was poorly understanding at the time Discharge Diagnoses:  Principal Problem:   Acute on chronic diastolic heart failure (HCC) Active Problems:   Hypertension   Acute renal failure [facility])   Essential hypertension   Elevated troponin   Encephalopathy acute   CHF (congestive heart failure) [facility])   Discharge Condition: Poor and declining  Filed Weights   10/21/14 0608 10/22/14 0641 10/23/14 0400  Weight: 236 lb 15.9 oz (107.5 kg) 242 lb 15.2 oz (110.2 kg) 248 lb 3.8 oz (112.6 kg)    History of present illness:  The patient is normally black female who is most pleasant with dementia onset of about 12-18 months ago resident of skilled nursing facility who had several episodes of dyspnea and had a workup for congestive heart failure really revealing normal systolic function EF of 60-65% with grade 1 diastolic dysfunction and concomitant workup for pulmonary emboli intrinsic lung disease was unrevealing and she was managed with the addition of mild beta blockers and subsequently discharged to return to the hospital again complaining of dyspnea and refusing to wear her nasal O2 again this time with worsening renal failure and  nephrology consult was obtained and her creatinine improved minimally until today creatinine 3.4 also reported 5 she likewise had hypoproteinemia and hypoalbuminemia despite ensure 1 can twice a day the patient was refusing various modalities of care throughout hospital stay this time and long discussion was had with 2 sisters I felt her demise was imminent and she had reached the therapeutic maximum benefit of hospitalization and felt a hospice consult was in order they seem to agree hospice consult was called in the hopes that she would remain at nursing care and her gradual demise be managed by Hospital hospice with our return to hospital as it would provide little benefit to Joyce Carpenter. Hospice would see the patient in a [facility] patient was discharged on Friday afternoon I am told by social services that they may not come until Monday. Hopefully patient will do adequately until that time. She likewise had a UTI which was treated effectively with Rocephin 1 g IV throughout throughout her hospital stay  Hospital Course:  See history of present illness above  Procedures:    Consultations:  Nephrology and hospice consult requested  Discharge Instructions  Discharge Instructions    Discharge instructions    Complete by:  As directed      Discharge patient    Complete by:  As directed             Medication List    STOP taking these medications        acetaminophen 325 MG tablet  Commonly known as:  TYLENOL     ALPRAZolam 1 MG tablet  Commonly known as:  XANAX     ascorbic  acid 500 MG tablet  Commonly known as:  VITAMIN C     moxifloxacin 400 MG tablet  Commonly known as:  AVELOX     ondansetron 4 MG tablet  Commonly known as:  ZOFRAN      TAKE these medications        amLODipine 5 MG tablet  Commonly known as:  NORVASC  Take 5 mg by mouth daily.     aspirin EC 81 MG tablet  Take 1 tablet (81 mg total) by mouth every other day.     atorvastatin 20 MG tablet  Commonly  known as:  LIPITOR  Take 20 mg by mouth daily.     feeding supplement (PRO-STAT SUGAR FREE 64) Liqd  Take 30 mLs by mouth 2 (two) times daily.     ferrous sulfate 325 (65 FE) MG tablet  Take 325 mg by mouth 3 (three) times daily with meals.     furosemide 40 MG tablet  Commonly known as:  LASIX  Take 40 mg by mouth daily.     hydrALAZINE 50 MG tablet  Commonly known as:  APRESOLINE  Take 1 tablet (50 mg total) by mouth every 8 (eight) hours.     ipratropium-albuterol 0.5-2.5 (3) MG/3ML Soln  Commonly known as:  DUONEB  Take 3 mLs by nebulization every 6 (six) hours as needed.     metoprolol tartrate 25 MG tablet  Commonly known as:  LOPRESSOR  Take 1 tablet (25 mg total) by mouth 2 (two) times daily.     multivitamin with minerals tablet  Take 1 tablet by mouth daily.     pantoprazole 40 MG tablet  Commonly known as:  PROTONIX  Take 1 tablet (40 mg total) by mouth 2 (two) times daily.     senna 8.6 MG tablet  Commonly known as:  SENOKOT  Take 1 tablet by mouth daily.     VITAMIN D-400 400 UNITS Tabs tablet  Generic drug:  cholecalciferol  Take 400 Units by mouth 2 (two) times daily.       No Known Allergies    The results of significant diagnostics from this hospitalization (including imaging, microbiology, ancillary and laboratory) are listed below for reference.    Significant Diagnostic Studies: Dg Chest 2 View  09/29/2014   CLINICAL DATA:  Cough, shortness of breath for 5 days.  EXAM: CHEST  2 VIEW  COMPARISON:  September 06, 2014  FINDINGS: The heart size and mediastinal contours are stable. The heart size is enlarged. There is pulmonary edema. There is no focal pneumonia. There are small bilateral pleural effusions. The visualized skeletal structures are stable P  IMPRESSION: Mild congestive heart failure.   Electronically Signed   By: Sherian Rein M.D.   On: 09/29/2014 12:34   Ct Head Wo Contrast  10/12/2014   CLINICAL DATA:  Headache and weakness for 1 day   EXAM: CT HEAD WITHOUT CONTRAST  TECHNIQUE: Contiguous axial images were obtained from the base of the skull through the vertex without intravenous contrast.  COMPARISON:  10/05/2014  FINDINGS: Skull and Sinuses:Negative for fracture or destructive process. The mastoids, middle ears, and imaged paranasal sinuses are clear.  Orbits: No acute abnormality.  Brain: No evidence of acute infarction, hemorrhage, hydrocephalus, or mass lesion/mass effect.  Age normal cerebral volume loss and ischemic gliosis around the lateral ventricles.  IMPRESSION: No acute finding.  Normal head CT for age.   Electronically Signed   By: Marnee Spring M.D.   On:  10/12/2014 06:23   Ct Head Wo Contrast  10/05/2014   CLINICAL DATA:  Headache following closed head injury, initial encounter  EXAM: CT HEAD WITHOUT CONTRAST  TECHNIQUE: Contiguous axial images were obtained from the base of the skull through the vertex without intravenous contrast.  COMPARISON:  None.  FINDINGS: The bony calvarium is intact. The ventricles are of normal size and configuration. No findings to suggest acute hemorrhage, acute infarction or space-occupying mass lesion are noted.  IMPRESSION: No acute intracranial abnormality noted.  These results will be called to the ordering clinician or representative by the Radiologist Assistant, and communication documented in the PACS or zVision Dashboard.   Electronically Signed   By: Alcide Clever M.D.   On: 10/05/2014 14:33   Nm Pulmonary Perf And Vent  10/12/2014   CLINICAL DATA:  Dyspnea.  EXAM: NUCLEAR MEDICINE VENTILATION - PERFUSION LUNG SCAN  TECHNIQUE: Ventilation images were obtained in multiple projections using inhaled aerosol Tc-72m DTPA. Perfusion images were obtained in multiple projections after intravenous injection of Tc-59m MAA.  RADIOPHARMACEUTICALS:  Forty-two Technetium-9m DTPA aerosol inhalation and 5.9 Technetium-77m MAA IV  COMPARISON:  None.  FINDINGS: Ventilation: There is heterogeneous  ventilation of both lungs with clumping of the radiopharmaceutical within the large airways.  Perfusion: No wedge shaped peripheral perfusion defects to suggest acute pulmonary embolism.  IMPRESSION: 1. Very low probability for acute pulmonary embolus.   Electronically Signed   By: Signa Kell M.D.   On: 10/12/2014 11:26   US Venous Img Lower Bilateral  10/12/2014   CLINICAL DATA:  BILATERAL lower extremity swelling  EXAM: BILATERAL LOWER EXTREMITY VENOUS DOPPLER ULTRASOUND  TECHNIQUE: Gray-scale sonography with graded compression, as well as color Doppler and duplex ultrasound were performed to evaluate the lower extremity deep venous systems from the level of the common femoral vein and including the common femoral, femoral, profunda femoral, popliteal and calf veins including the posterior tibial, peroneal and gastrocnemius veins when visible. The superficial great saphenous vein was also interrogated. Spectral Doppler was utilized to evaluate flow at rest and with distal augmentation maneuvers in the common femoral, femoral and popliteal veins. Limitations of exam secondary to soft tissue swelling at the lower legs bilaterally, body habitus, and patient motion.  COMPARISON:  None  FINDINGS: RIGHT LOWER EXTREMITY  Common Femoral Vein: No evidence of thrombus. Normal compressibility, respiratory phasicity and response to augmentation.  Saphenofemoral Junction: No evidence of thrombus. Normal compressibility and flow on color Doppler imaging.  Profunda Femoral Vein: No evidence of thrombus. Normal compressibility and flow on color Doppler imaging.  Femoral Vein: No evidence of thrombus. Normal compressibility, respiratory phasicity and response to augmentation.  Popliteal Vein: No evidence of thrombus. Normal compressibility, respiratory phasicity and response to augmentation.  Calf Veins: No evidence of thrombus. Normal compressibility and flow on color Doppler imaging.  Superficial Great Saphenous Vein:  Limited visualization in the calf secondary to soft tissue swelling. Visualized portion show no evidence of thrombus. Normal compressibility and flow on color Doppler imaging.  Venous Reflux:  None.  Other Findings: Calf soft tissue swelling. Complex collection RIGHT popliteal fossa question complex Baker cyst 6.1 x 1.6 x 3.8 cm.  LEFT LOWER EXTREMITY  Common Femoral Vein: No evidence of thrombus. Normal compressibility, respiratory phasicity and response to augmentation.  Saphenofemoral Junction: No evidence of thrombus. Normal compressibility and flow on color Doppler imaging.  Profunda Femoral Vein: No evidence of thrombus. Normal compressibility and flow on color Doppler imaging.  Femoral Vein: No evidence of  thrombus. Normal compressibility, respiratory phasicity and response to augmentation.  Popliteal Vein: No evidence of thrombus. Normal compressibility, respiratory phasicity and response to augmentation.  Calf Veins: No evidence of thrombus. Normal compressibility and flow on color Doppler imaging.  Superficial Great Saphenous Vein: Limited visualization at the calf due to swelling. Visualized portions show no evidence of thrombus. Normal compressibility and flow on color Doppler imaging.  Venous Reflux:  None.  Other Findings: Soft tissue swelling lower LEFT leg. Complex fluid collection LEFT popliteal fossa question Baker cyst 5.2 x 1.6 x 3.7 cm.  IMPRESSION: No evidence of deep venous thrombosis in either lower extremity.  Complicated collections at the popliteal fossa bilaterally question complicated Baker cysts.  Soft tissue swelling at the calves bilaterally   Electronically Signed   By: [name]   On: 10/12/2014 10:52   Dg Chest Portable 1 View  10/18/2014   CLINICAL DATA:  Decreased O2 saturation.  Initial encounter.  EXAM: PORTABLE CHEST 1 VIEW  COMPARISON:  Chest radiograph performed 10/14/2014  FINDINGS: The lungs are well-aerated. Vascular congestion is noted, with bibasilar airspace  opacities and small bilateral pleural effusions, likely reflecting pulmonary edema. There is no evidence of pneumothorax.  The cardiomediastinal silhouette is enlarged. No acute osseous abnormalities are seen.  IMPRESSION: Vascular congestion and cardiomegaly, with bibasilar airspace opacities and small bilateral pleural effusions, reflecting mildly worsened pulmonary edema.   Electronically Signed   By: Roanna Raider M.D.   On: 10/18/2014 03:54   Dg Chest Port 1 View  10/14/2014   CLINICAL DATA:  Shortness of breath  EXAM: PORTABLE CHEST 1 VIEW  COMPARISON:  10/12/2014  FINDINGS: Cardiomegaly with mild interstitial edema. Moderate right and small left pleural effusions. No pneumothorax.  IMPRESSION: Cardiomegaly with mild interstitial edema.  Moderate right and small left pleural effusions.   Electronically Signed   By: Charline Bills M.D.   On: 10/14/2014 10:20   Dg Chest Port 1 View  10/12/2014   CLINICAL DATA:  Shortness of breath, extremity swelling, and generalized body aches. Dyspnea.  EXAM: PORTABLE CHEST 1 VIEW  COMPARISON:  Frontal and lateral views 09/29/2014  FINDINGS: Progressive bibasilar airspace disease, likely combination of pleural effusions and atelectasis. Progressive cardiomegaly. Mild pulmonary edema, similar to prior. A skin fold projects over the right upper hemithorax. Chronic deformities noted about both shoulders.  IMPRESSION: Findings suggestive of progressive CHF.   Electronically Signed   By: Rubye Oaks M.D.   On: 10/12/2014 03:42    Microbiology: Recent Results (from the past 240 hour(s))  Urine culture     Status: None   Collection Time: 10/14/14  2:27 AM  Result Value Ref Range Status   Specimen Description URINE, CLEAN CATCH  Final   Special Requests NONE  Final   Culture   Final    >=100,000 COLONIES/mL ESCHERICHIA COLI Performed at [facility]    Report Status 10/16/2014 FINAL  Final   Organism ID, Bacteria ESCHERICHIA COLI  Final       Susceptibility   Escherichia coli - MIC*    AMPICILLIN 4 SENSITIVE Sensitive     CEFAZOLIN <=4 SENSITIVE Sensitive     CEFTRIAXONE <=1 SENSITIVE Sensitive     CIPROFLOXACIN <=0.25 SENSITIVE Sensitive     GENTAMICIN <=1 SENSITIVE Sensitive     IMIPENEM <=0.25 SENSITIVE Sensitive     NITROFURANTOIN <=16 SENSITIVE Sensitive     TRIMETH/SULFA <=20 SENSITIVE Sensitive     AMPICILLIN/SULBACTAM <=2 SENSITIVE Sensitive  PIP/TAZO <=4 SENSITIVE Sensitive     * >=100,000 COLONIES/mL ESCHERICHIA COLI  MRSA PCR Screening     Status: None   Collection Time: 10/14/14  2:30 AM  Result Value Ref Range Status   MRSA by PCR NEGATIVE NEGATIVE Final    Comment:        The GeneXpert MRSA Assay (FDA approved for NASAL specimens only), is one component of a comprehensive MRSA colonization surveillance program. It is not intended to diagnose MRSA infection nor to guide or monitor treatment for MRSA infections.      Labs: Basic Metabolic Panel:  Recent Labs Lab 10/18/14 0342 10/18/14 0507 10/19/14 0649 10/20/14 0623 10/21/14 0639  NA 138  --  139 139 140  K 5.7* 5.7* 5.0 4.9 4.7  CL 107  --  107 108 108  CO2 24  --  25 25 25   GLUCOSE 107*  --  97 121* 103*  BUN 77*  --  81* 86* 83*  CREATININE 2.79*  --  3.32* 3.56* 3.42*  CALCIUM 8.2*  --  7.9* 7.8* 7.9*  PHOS  --   --  5.9*  --   --    Liver Function Tests:  Recent Labs Lab 10/18/14 0342  AST 29  ALT 28  ALKPHOS 105  BILITOT 0.4  PROT 6.3*  ALBUMIN 2.6*   No results for input(s): LIPASE, AMYLASE in the last 168 hours. No results for input(s): AMMONIA in the last 168 hours. CBC:  Recent Labs Lab 10/18/14 0342  WBC 6.3  NEUTROABS 3.5  HGB 7.7*  HCT 24.7*  MCV 102.9*  PLT 259   Cardiac Enzymes:  Recent Labs Lab 10/18/14 0342 10/18/14 0748 10/18/14 1321 10/19/14 0649  TROPONINI 0.04* 0.03 0.04* 0.04*   BNP: BNP (last 3 results)  Recent Labs  09/06/14 0218 10/12/14 0240 10/18/14 0342  BNP 327.0*  146.0* 503.0*    ProBNP (last 3 results) No results for input(s): PROBNP in the last 8760 hours.  CBG: No results for input(s): GLUCAP in the last 168 hours.     Signed:  Virgilia Quigg Judie Petit  [facility] Pager: [phone] 10/23/2014, 12:54 PM

## 2014-10-23 NOTE — Care Management Note (Signed)
Case Management Note  Patient Details  Name: Joyce Carpenter MRN: [id number] Date of Birth: 05/09/1930  Subjective/Objective:                    Action/Plan:   Expected Discharge Date:                  Expected Discharge Plan:  Skilled Nursing Facility  In-House Referral:  Clinical Social Work  Discharge planning Services  CM Consult  Post Acute Care Choice:  NA Choice offered to:  NA  DME Arranged:    DME Agency:     HH Arranged:    HH Agency:     Status of Service:  Completed, signed off  Medicare Important Message Given:  Yes-second notification given Date Medicare IM Given:    Medicare IM give by:    Date Additional Medicare IM Given:    Additional Medicare Important Message give by:     If discussed at Long Length of Stay Meetings, dates discussed:    Additional Comments: Anticipate discharge to [facility] today. CSW to arrange discharge once written. Arlyss Queen Hastings, RN 10/23/2014, 11:25 AM

## 2014-10-23 NOTE — Care Management Important Message (Signed)
Important Message  Patient Details  Name: Joyce Carpenter MRN: [id number] Date of Birth: [birth year]   Medicare Important Message Given:  Yes-second notification given    Cheryl Flash, RN 10/23/2014, 11:24 AM

## 2014-10-23 NOTE — Progress Notes (Signed)
10/23/2014 13:00  Joyce Carpenter to be D/C'd Skilled nursing facility per MD order.  Called report to caregiver at [facility].    Medication List    STOP taking these medications        acetaminophen 325 MG tablet  Commonly known as:  TYLENOL     ALPRAZolam 1 MG tablet  Commonly known as:  XANAX     ascorbic acid 500 MG tablet  Commonly known as:  VITAMIN C     moxifloxacin 400 MG tablet  Commonly known as:  AVELOX     ondansetron 4 MG tablet  Commonly known as:  ZOFRAN      TAKE these medications        amLODipine 5 MG tablet  Commonly known as:  NORVASC  Take 5 mg by mouth daily.     aspirin EC 81 MG tablet  Take 1 tablet (81 mg total) by mouth every other day.     atorvastatin 20 MG tablet  Commonly known as:  LIPITOR  Take 20 mg by mouth daily.     feeding supplement (PRO-STAT SUGAR FREE 64) Liqd  Take 30 mLs by mouth 2 (two) times daily.     ferrous sulfate 325 (65 FE) MG tablet  Take 325 mg by mouth 3 (three) times daily with meals.     furosemide 40 MG tablet  Commonly known as:  LASIX  Take 40 mg by mouth daily.     hydrALAZINE 50 MG tablet  Commonly known as:  APRESOLINE  Take 1 tablet (50 mg total) by mouth every 8 (eight) hours.     ipratropium-albuterol 0.5-2.5 (3) MG/3ML Soln  Commonly known as:  DUONEB  Take 3 mLs by nebulization every 6 (six) hours as needed.     metoprolol tartrate 25 MG tablet  Commonly known as:  LOPRESSOR  Take 1 tablet (25 mg total) by mouth 2 (two) times daily.     multivitamin with minerals tablet  Take 1 tablet by mouth daily.     pantoprazole 40 MG tablet  Commonly known as:  PROTONIX  Take 1 tablet (40 mg total) by mouth 2 (two) times daily.     senna 8.6 MG tablet  Commonly known as:  SENOKOT  Take 1 tablet by mouth daily.     VITAMIN D-400 400 UNITS Tabs tablet  Generic drug:  cholecalciferol  Take 400 Units by mouth 2 (two) times daily.        Filed Vitals:   10/23/14 1443  BP: 113/31   Pulse: 55  Temp: 97.6 F (36.4 C)  Resp: 18    Skin clean, dry and intact.  Moisture associated skin damage noted and reported to SNF staff.   IV catheter discontinued intact. Site without signs and symptoms of complications. Dressing and pressure applied. Pt denies pain at this time. No complaints noted.  Pt packet was sent with pt to SNF Patient escorted via EMS, and D/C SNF via private EMS.  Bradly Chris

## 2014-10-23 NOTE — Clinical Social Work Note (Signed)
Pt d/c today back to [facility]. Unable to reach any family today regarding hospice consult. Facility is aware and will set up consult upon return. Per MD, he had discussion with two sisters yesterday and they were agreeable. D/C summary faxed. Pt to transport via [facility]. CSW left voicemails regarding d/c with both sisters, Merdis Delay and [name].   Derenda Fennel, LCSW [phone]

## 2014-10-23 NOTE — Progress Notes (Signed)
Notified MD, pt removed iv and refuses to let staff start another iv. Antibiotics are due. Awaiting MD's response.

## 2014-10-23 NOTE — Progress Notes (Signed)
Subjective: She claims to be the same . Still her appetite is not great  Objective: Vital signs in last 24 hours: Temp:  [98.2 F (36.8 C)-98.9 F (37.2 C)] 98.2 F (36.8 C) (10/07 0400) Pulse Rate:  [57-68] 57 (10/07 0400) Resp:  [14-20] 17 (10/07 0400) BP: (124-141)/(39-62) 124/49 mmHg (10/07 0400) SpO2:  [99 %-100 %] 99 % (10/07 0400) Weight:  [248 lb 3.8 oz (112.6 kg)] 248 lb 3.8 oz (112.6 kg) (10/07 0400)  Intake/Output from previous day: 10/06 0701 - 10/07 0700 In: 1701.7 [P.O.:460; I.V.:1241.7] Out: 150 [Urine:150] Intake/Output this shift: Total I/O In: 240 [P.O.:240] Out: -   No results for input(s): HGB in the last 72 hours. No results for input(s): WBC, RBC, HCT, PLT in the last 72 hours.  Recent Labs  10/21/14 0639  NA 140  K 4.7  CL 108  CO2 25  BUN 83*  CREATININE 3.42*  GLUCOSE 103*  CALCIUM 7.9*   No results for input(s): LABPT, INR in the last 72 hours.  Patient is alert and in no apparent distress.  Chest: Decreased breath sound Heart exam regular rate and rhythm Extremities no edema  Assessment/Plan: Problem #1 acute kidney injury:. Possibly prerenal versus ATN.  As patient declined IV fluid and medications presently there is no new blood work. .  Problem #2 hyperkalemia: Her potassium was normal.  Problem #3 anemia: Her hemoglobin has declined significantly. Possibly from her renal failure. Her iron saturation is much better than befroe Problem #4 history of CHF: Her IV fluid and Lasix was discontinued. Patient remains noncompliant with her medications.  Problem #5 metabolic bone disease her calcium is within acceptable range however her phosphorus is slightly high. Not on a binder Problem #6 hypertension: Her blood pressure is reasonably controlled Problem #7 confusion/encephalopathy. Fluctuating but seems better today Plan: 1] since patient has declined further treatment and blood work and she is going to be discharged to nursing home with  possible involvement of hospice house sign of. Thank you for letting me perspective in her management.    Alby Schwabe S 10/23/2014, 9:58 AM

## 2014-10-30 NOTE — Telephone Encounter (Signed)
[facility] [place] [facility] 2[facility]

## 2014-11-17 DEATH — deceased

## 2015-11-22 IMAGING — CT CT HEAD W/O CM
1 of 2 series · 16 of 30 positions shown, 20 images · non-contrast
Comparison: 10/05/2014

CLINICAL DATA: Headache and weakness for 1 day

EXAM:
CT HEAD WITHOUT CONTRAST
TECHNIQUE: Contiguous axial images were obtained from the base of the skull
through the vertex without intravenous contrast.

[Series 3: headtrauma 2.4 h60s · axial · 0.43mm/px · z∈[+1092,+1247]mm · 16 of 72 slices shown, 20 images]
[im 4/72  brain]
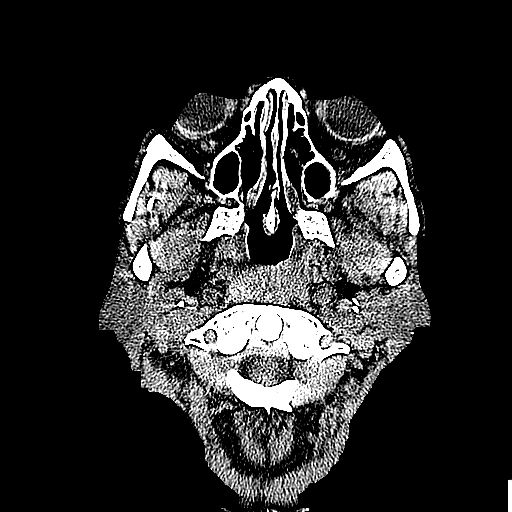
[im 4/72  bone]
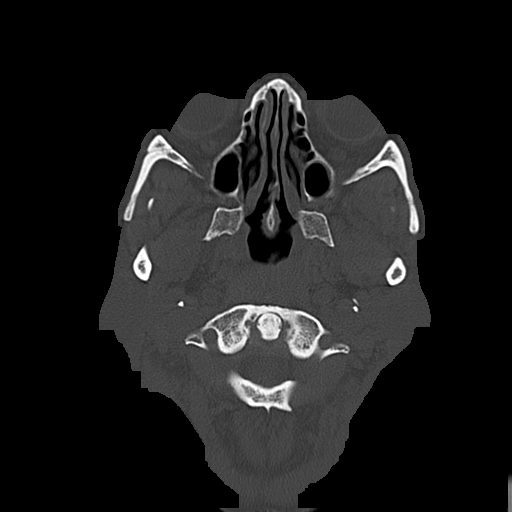
[im 8/72  brain]
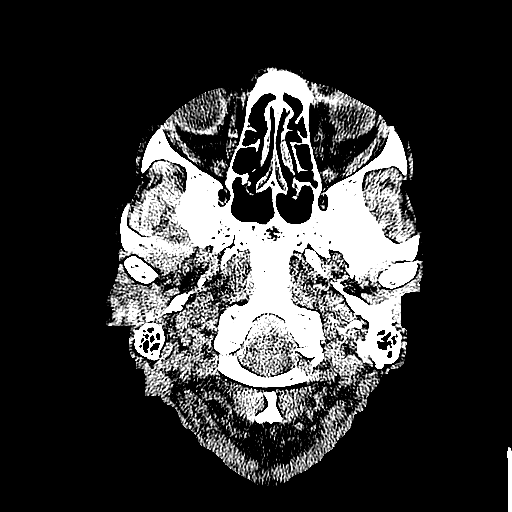
[im 12/72  brain]
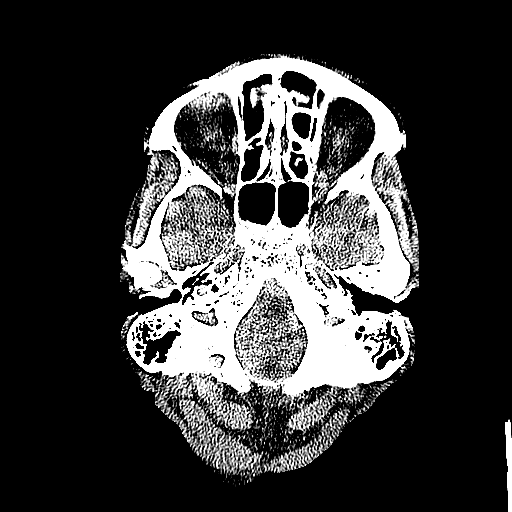
[im 15/72  brain]
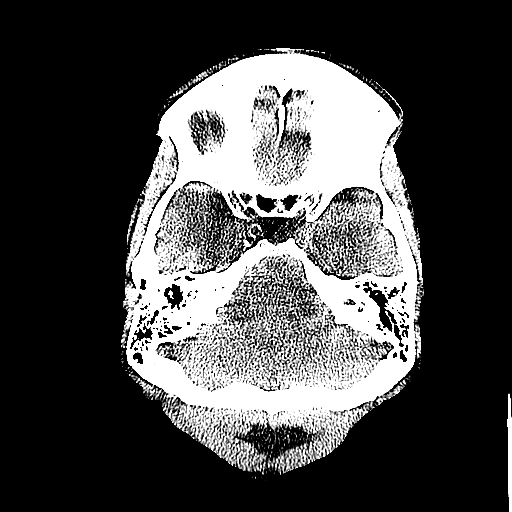
[im 23/72  brain]
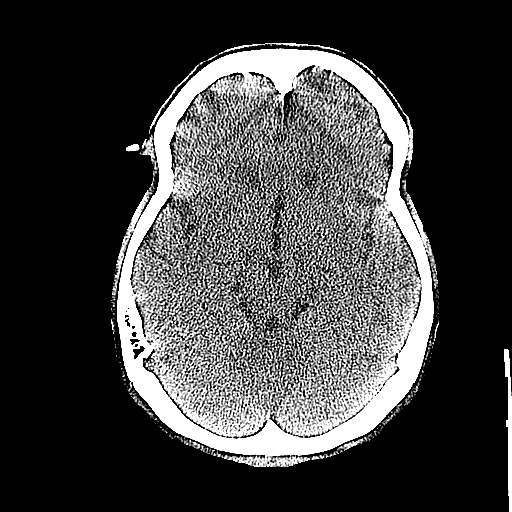
[im 23/72  bone]
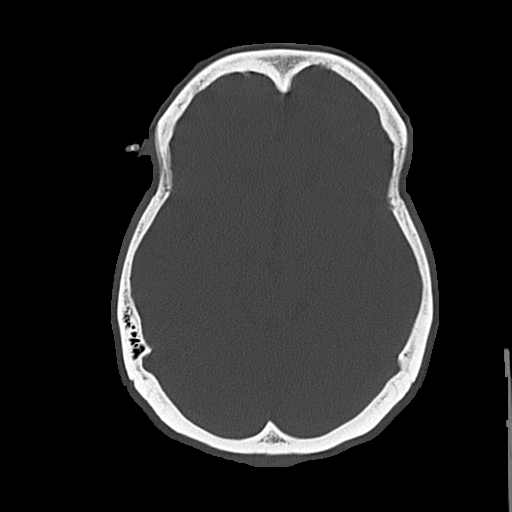
[im 27/72  brain]
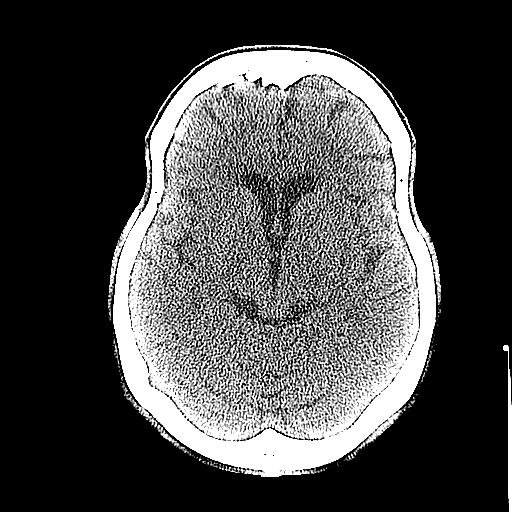
[im 30/72  brain]
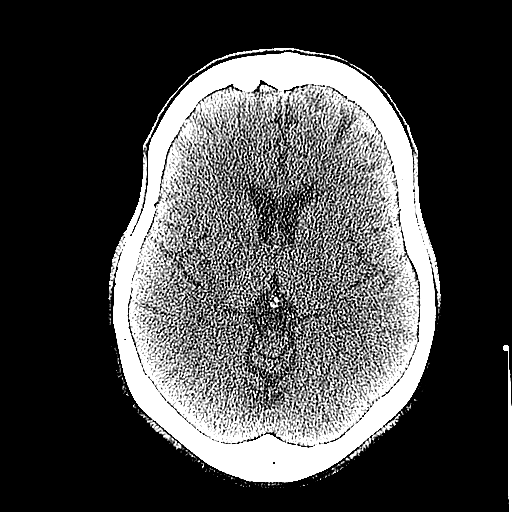
[im 34/72  brain]
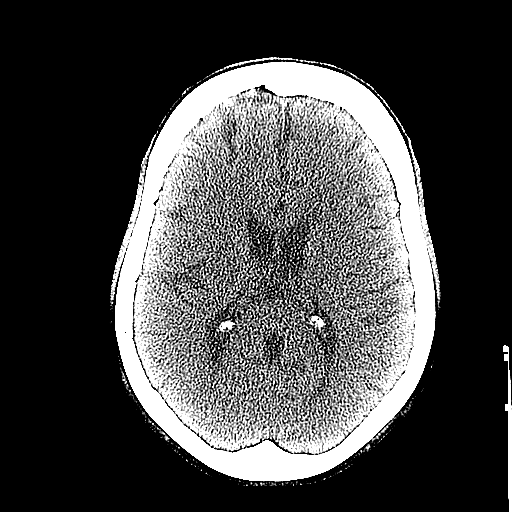
[im 38/72  brain]
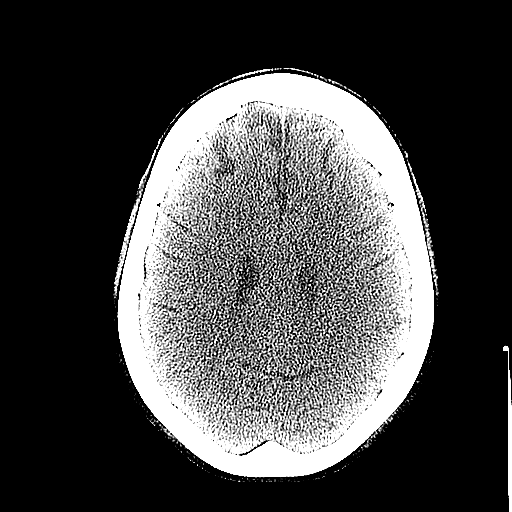
[im 38/72  bone]
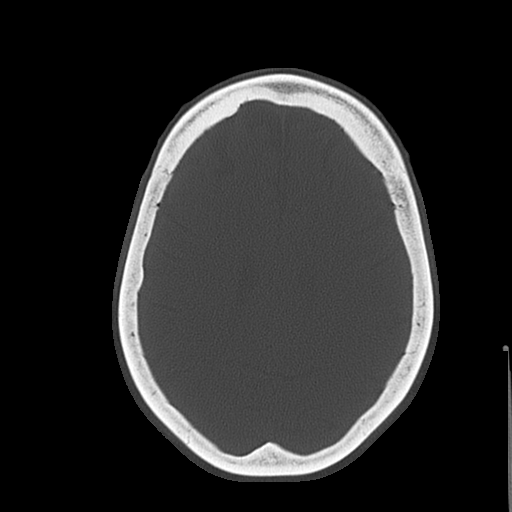
[im 42/72  brain]
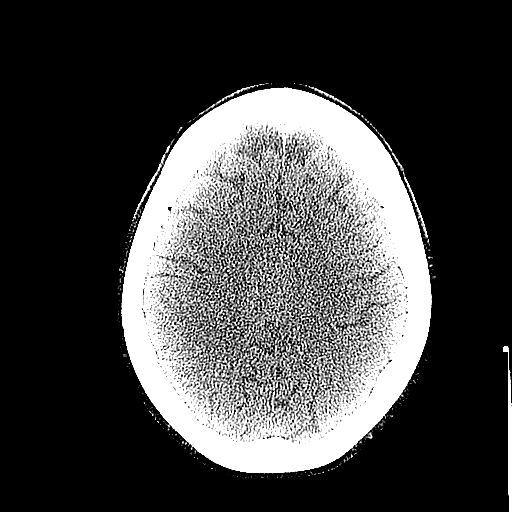
[im 45/72  brain]
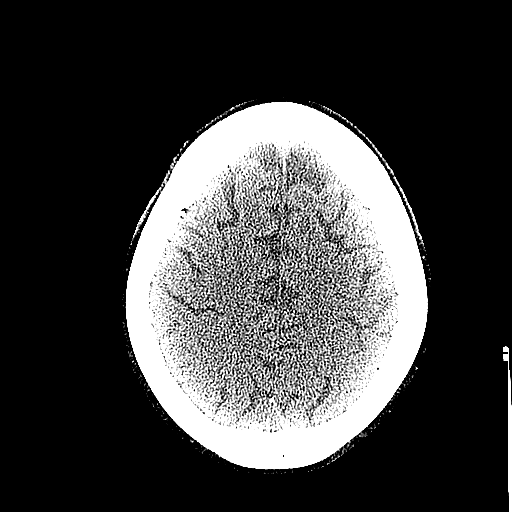
[im 49/72  brain]
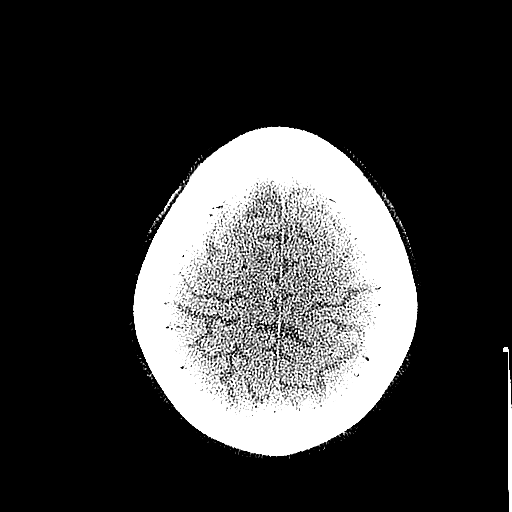
[im 57/72  brain]
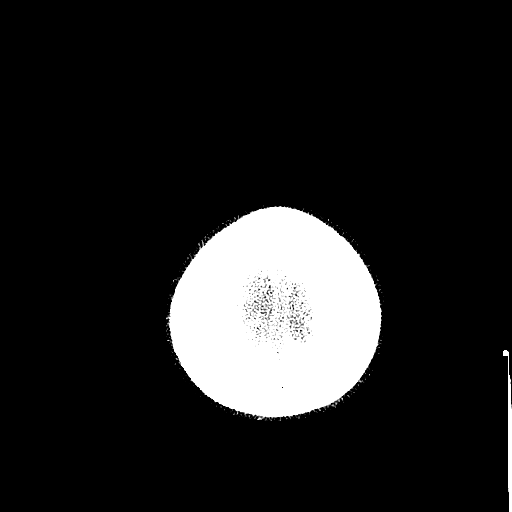
[im 57/72  bone]
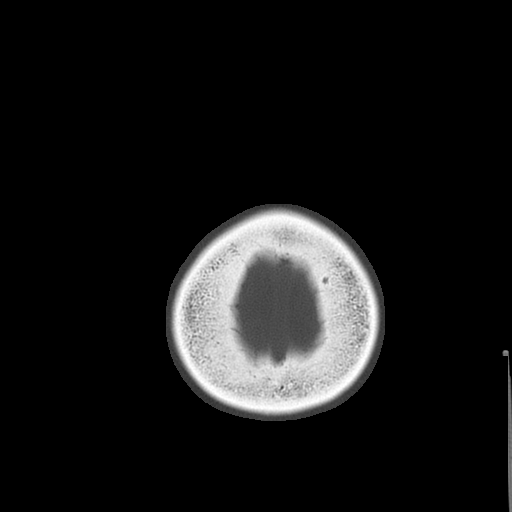
[im 60/72  brain]
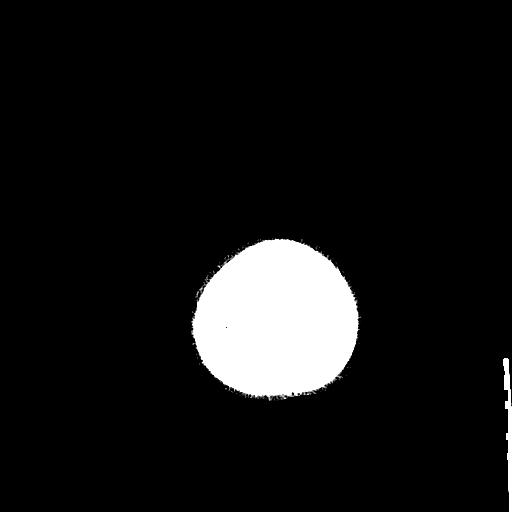
[im 64/72  brain]
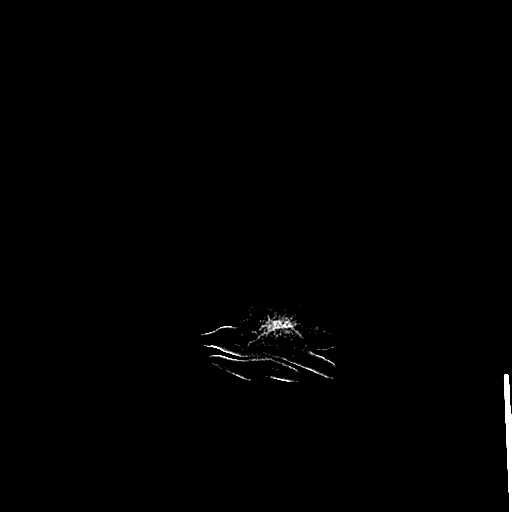
[im 68/72  brain]
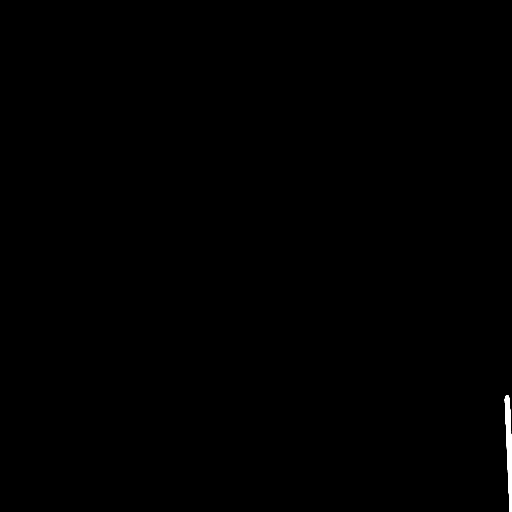

[16 of 30 positions shown; findings below may reference images not displayed]

FINDINGS: Skull and Sinuses:Negative for fracture or destructive process. The
mastoids, middle ears, and imaged paranasal sinuses are clear.

Orbits: No acute abnormality.

Brain: No evidence of acute infarction, hemorrhage, hydrocephalus,
or mass lesion/mass effect.

Age normal cerebral volume loss and ischemic gliosis around the
lateral ventricles.
IMPRESSION: No acute finding.  Normal head CT for age.

## 2015-11-24 IMAGING — CR DG CHEST 1V PORT
1 series · 1 of 1 positions shown · non-contrast
Comparison: 10/12/2014

CLINICAL DATA: Shortness of breath

EXAM:
PORTABLE CHEST 1 VIEW

[ap portable]
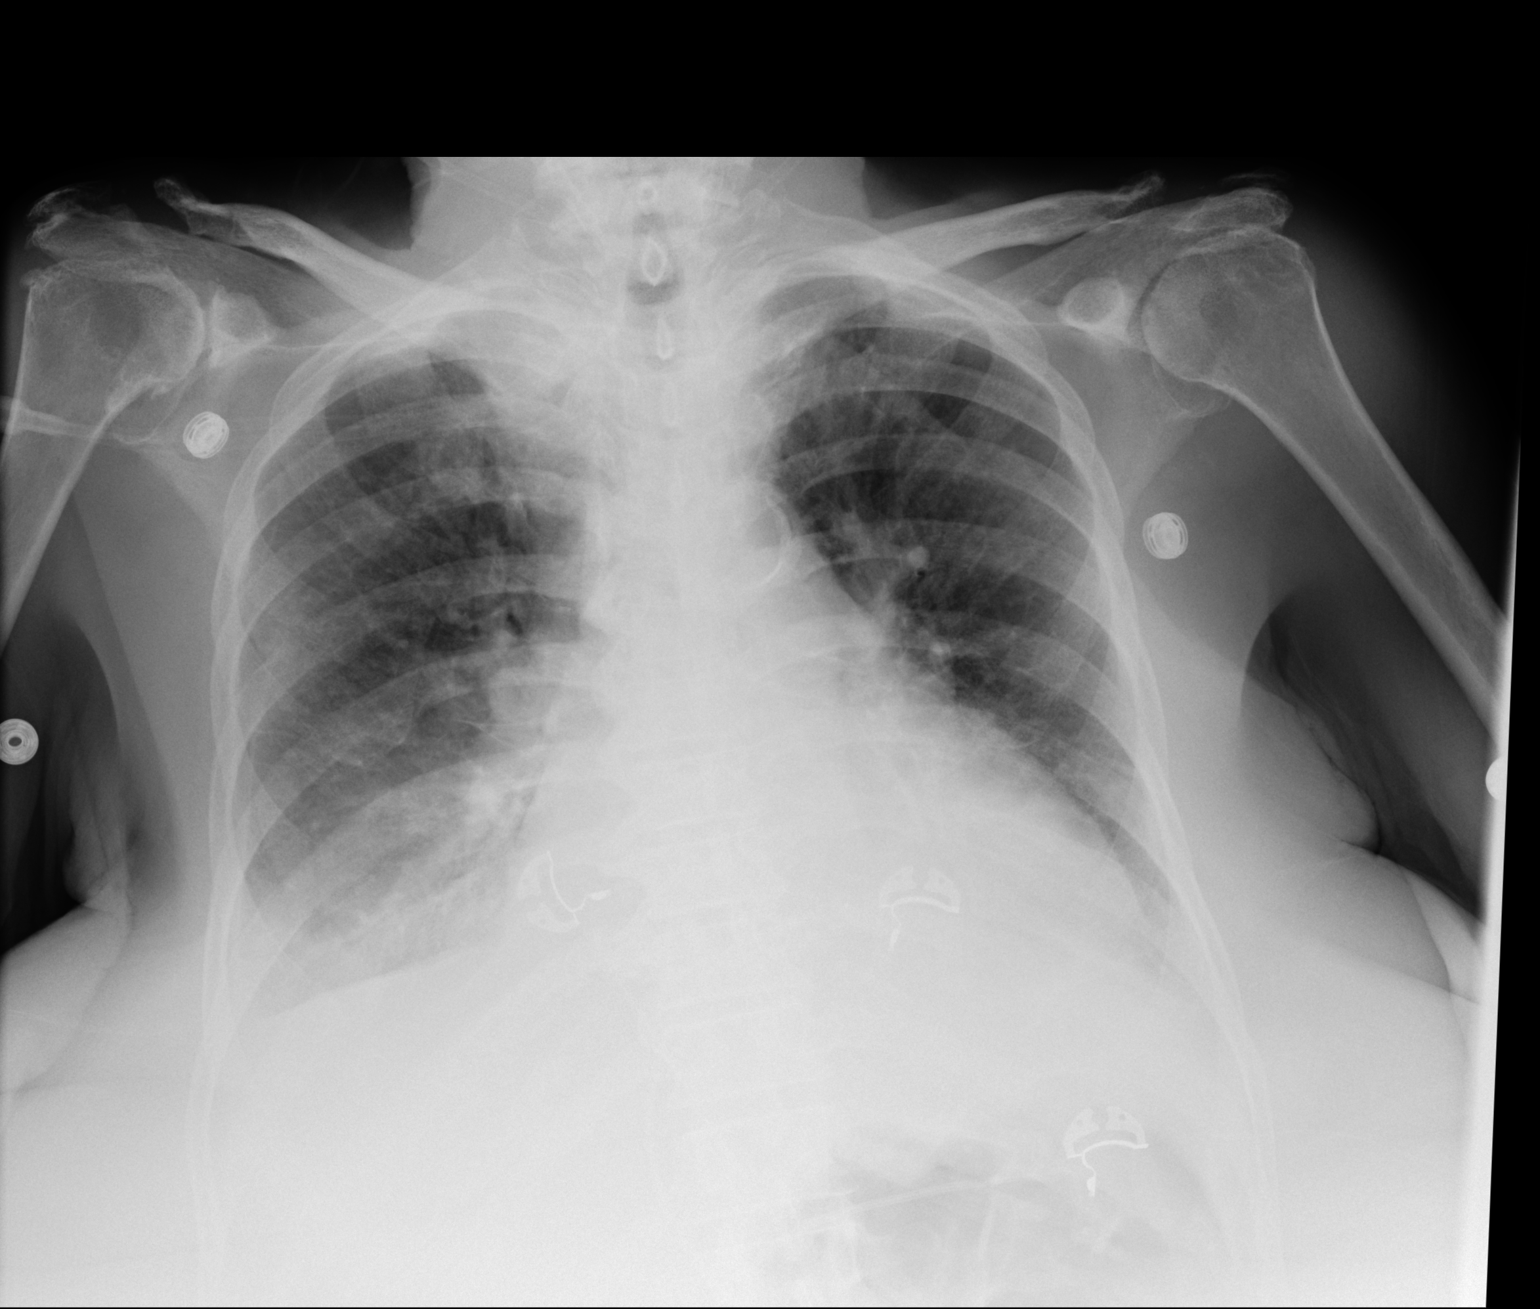

[1 of 1 positions shown; findings below may reference images not displayed]

FINDINGS: Cardiomegaly with mild interstitial edema. Moderate right and small
left pleural effusions. No pneumothorax.
IMPRESSION: Cardiomegaly with mild interstitial edema.

Moderate right and small left pleural effusions.

## 2015-11-28 IMAGING — CR DG CHEST 1V PORT
1 series · 1 of 1 positions shown · non-contrast
Comparison: Chest radiograph performed 10/14/2014

CLINICAL DATA: Decreased O2 saturation.  Initial encounter.

EXAM:
PORTABLE CHEST 1 VIEW

[ap portable]
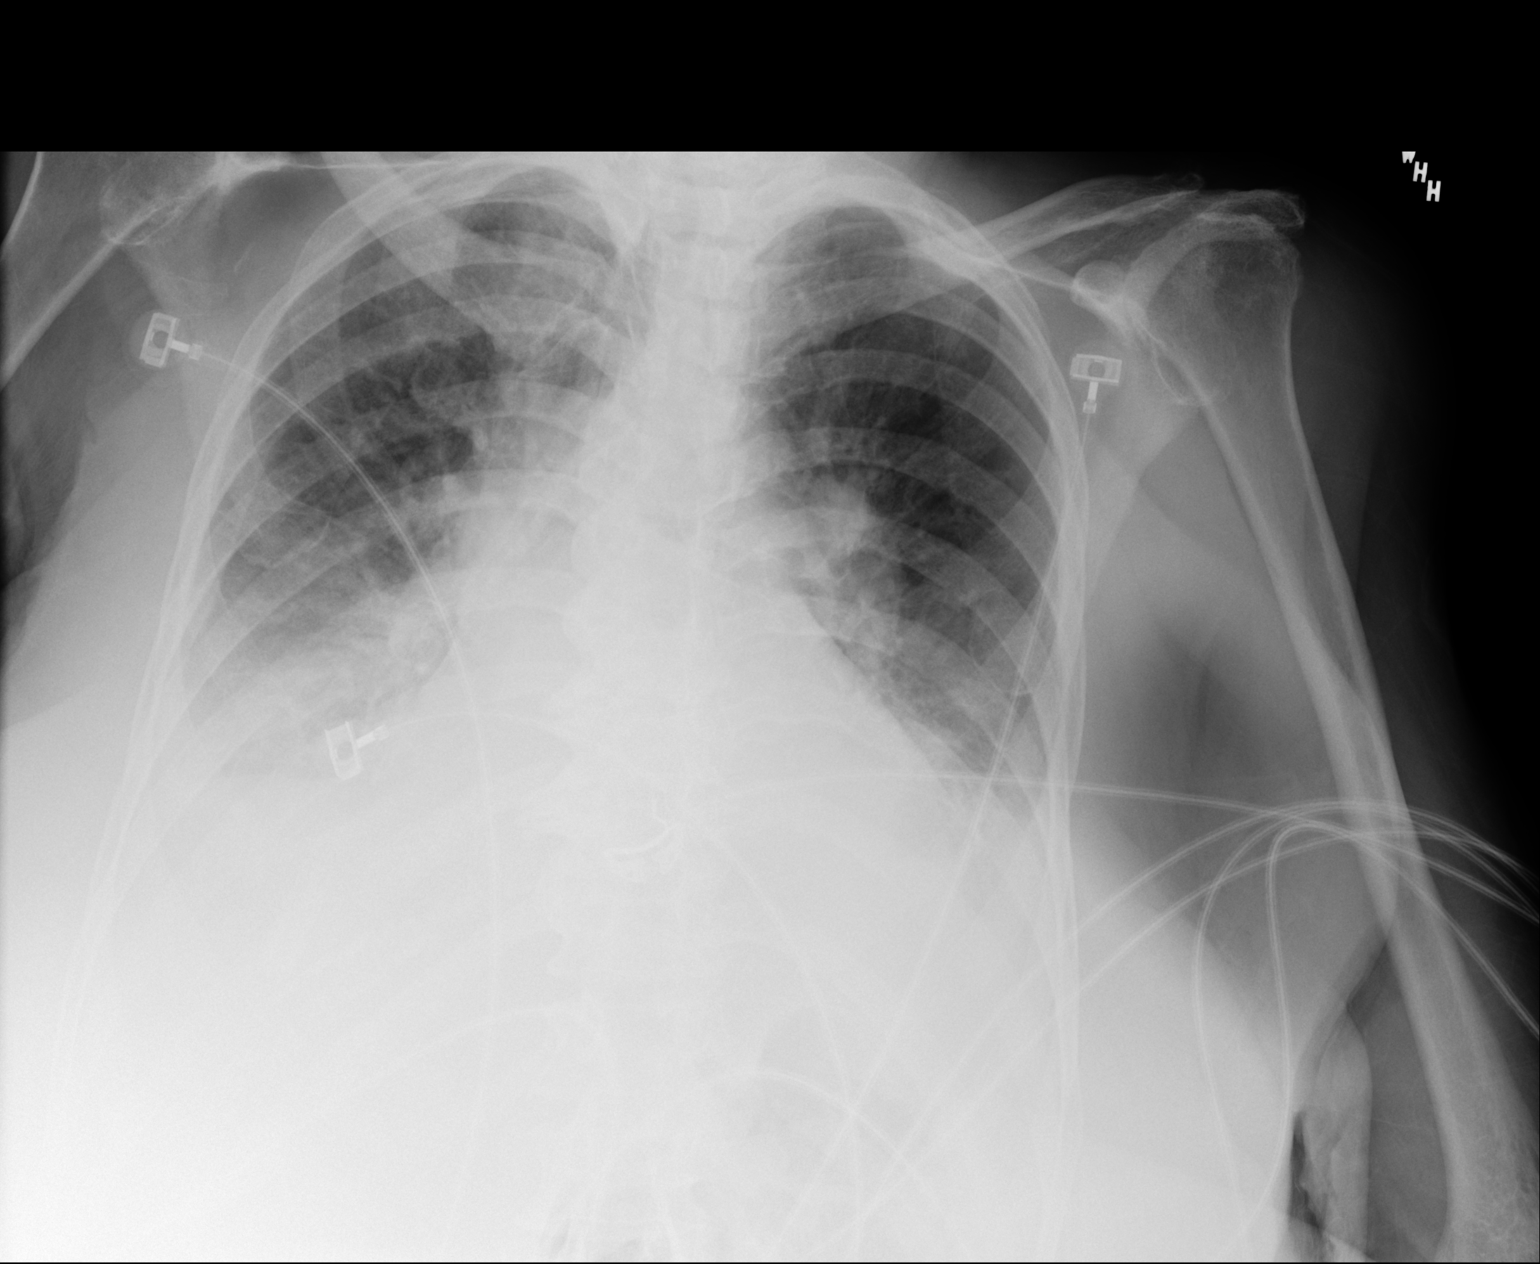

[1 of 1 positions shown; findings below may reference images not displayed]

FINDINGS: The lungs are well-aerated. Vascular congestion is noted, with
bibasilar airspace opacities and small bilateral pleural effusions,
likely reflecting pulmonary edema. There is no evidence of
pneumothorax.

The cardiomediastinal silhouette is enlarged. No acute osseous
abnormalities are seen.
IMPRESSION: Vascular congestion and cardiomegaly, with bibasilar airspace
opacities and small bilateral pleural effusions, reflecting mildly
worsened pulmonary edema.

## 2017-06-14 IMAGING — US US EXTREM LOW VENOUS BILAT
1 series · 12 of 24 positions shown · non-contrast
Comparison: None

CLINICAL DATA: BILATERAL lower extremity swelling

EXAM:
BILATERAL LOWER EXTREMITY VENOUS DOPPLER ULTRASOUND
TECHNIQUE: Gray-scale sonography with graded compression, as well as color
Doppler and duplex ultrasound were performed to evaluate the lower
extremity deep venous systems from the level of the common femoral
vein and including the common femoral, femoral, profunda femoral,
popliteal and calf veins including the posterior tibial, peroneal
and gastrocnemius veins when visible. The superficial great
saphenous vein was also interrogated. Spectral Doppler was utilized
to evaluate flow at rest and with distal augmentation maneuvers in
the common femoral, femoral and popliteal veins. Limitations of exam
secondary to soft tissue swelling at the lower legs bilaterally,
body habitus, and patient motion.

[Series 1: us extrem low venous bilat · 12 of 103 slices shown]
[im 5/103]
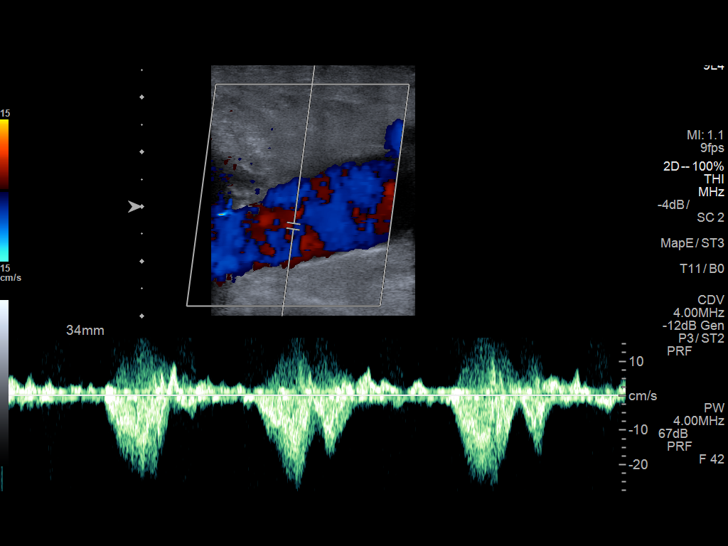
[im 14/103]
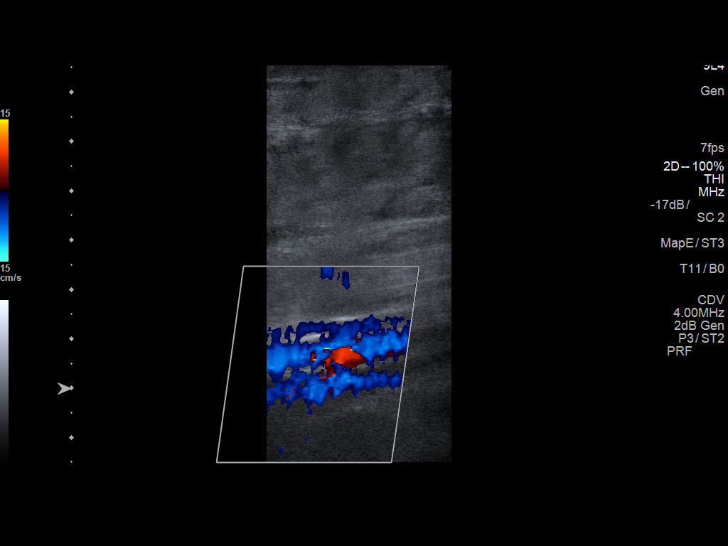
[im 23/103]
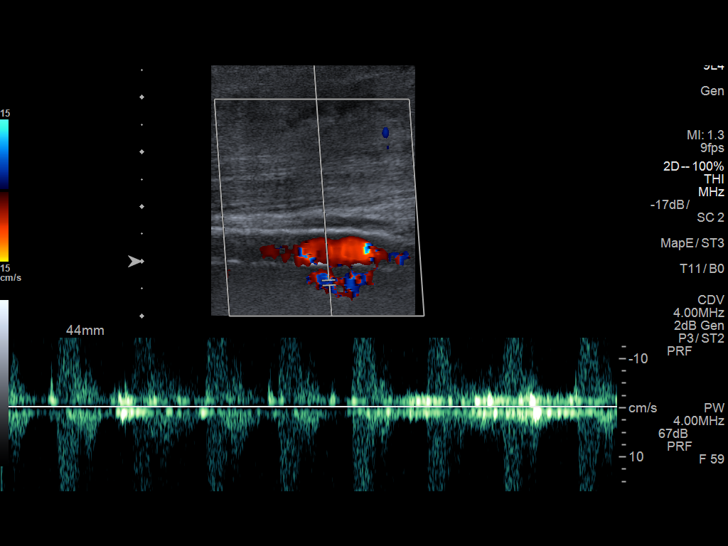
[im 32/103]
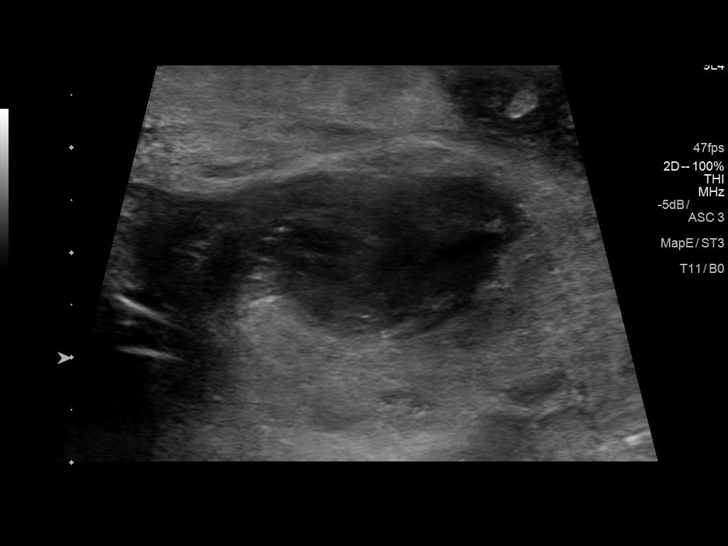
[im 40/103]
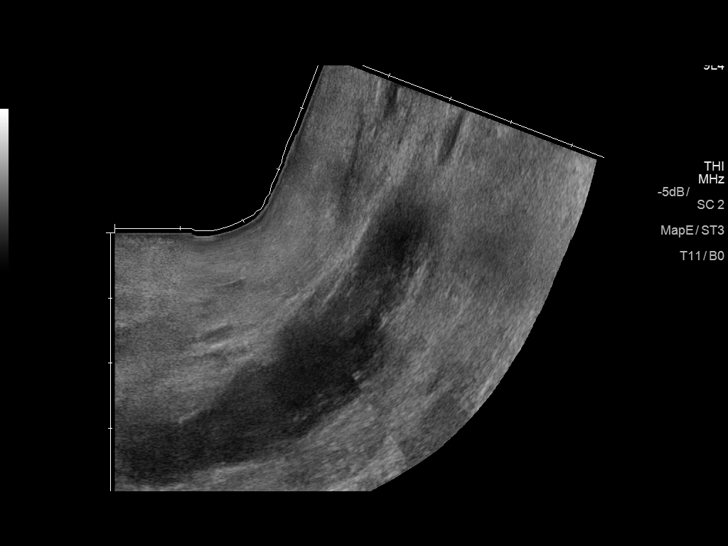
[im 49/103]
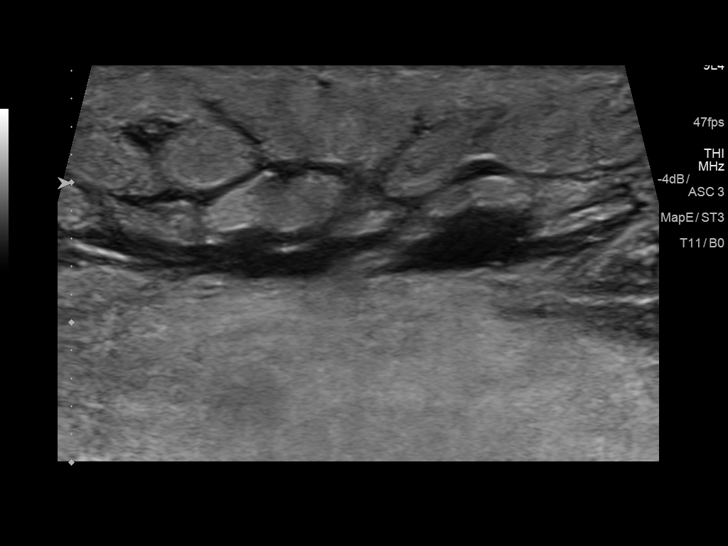
[im 58/103]
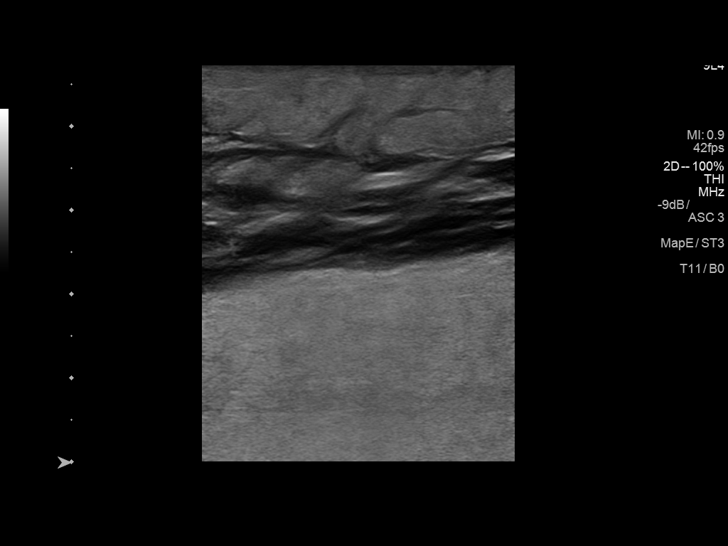
[im 67/103]
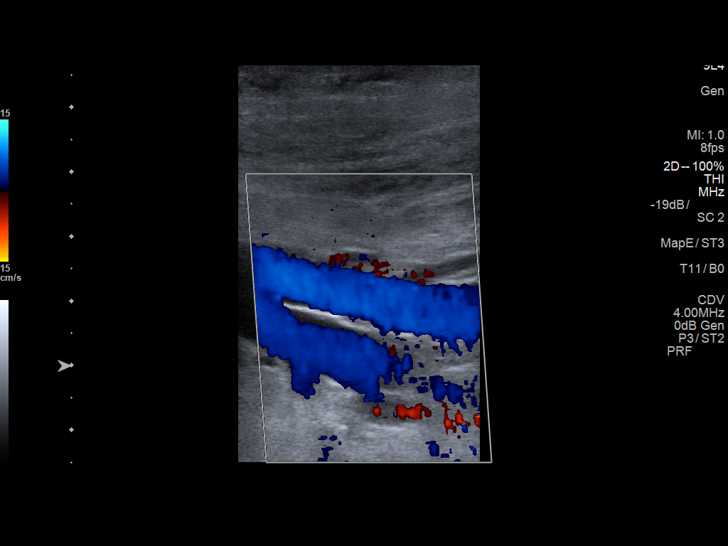
[im 76/103]
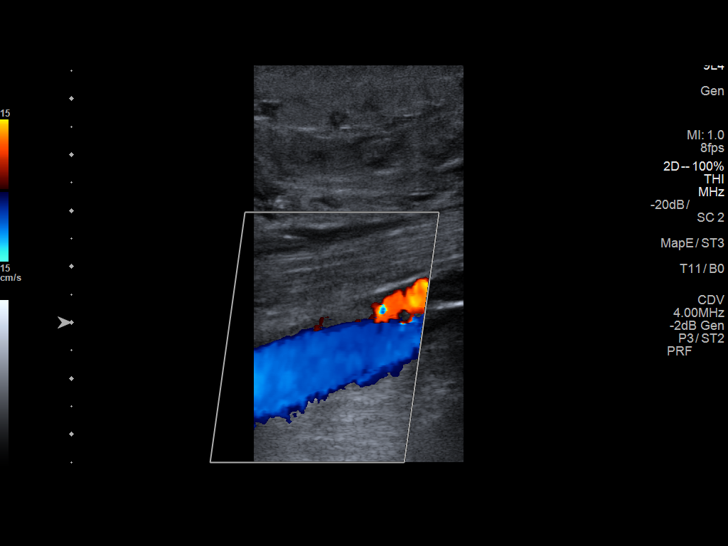
[im 85/103]
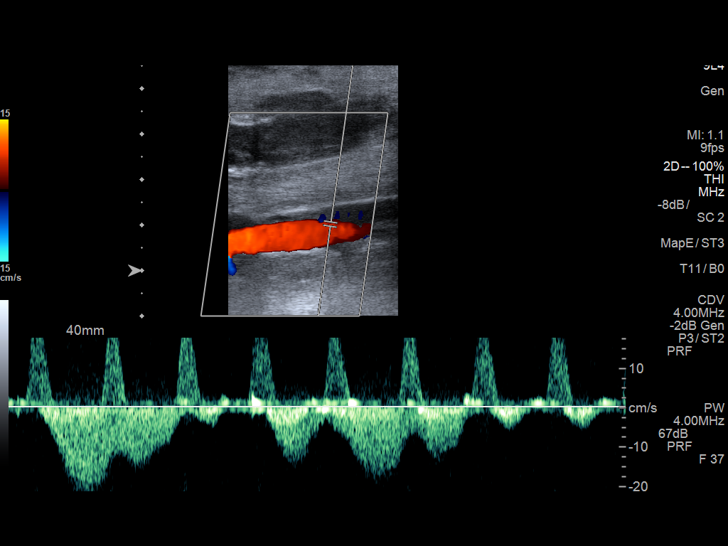
[im 94/103]
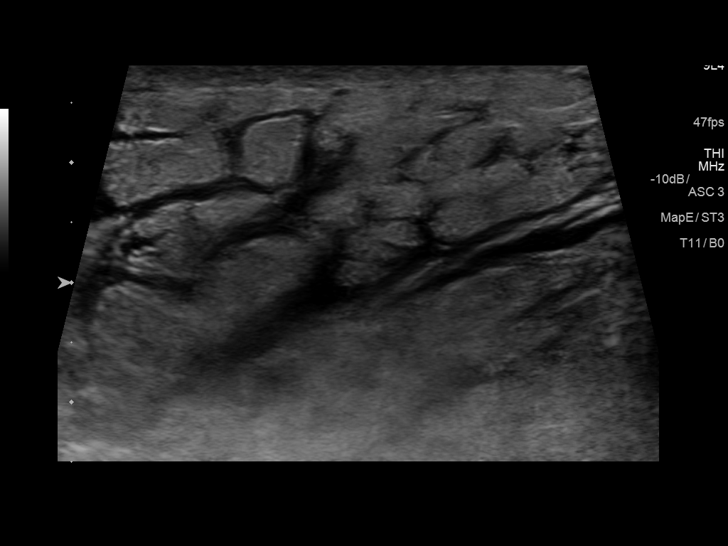
[im 103/103]
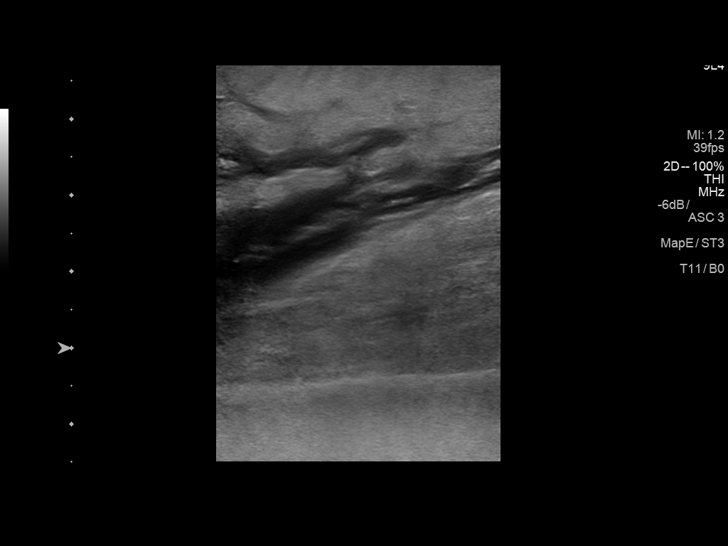

[12 of 24 positions shown; findings below may reference images not displayed]

FINDINGS: RIGHT LOWER EXTREMITY

Common Femoral Vein: No evidence of thrombus. Normal
compressibility, respiratory phasicity and response to augmentation.

Saphenofemoral Junction: No evidence of thrombus. Normal
compressibility and flow on color Doppler imaging.

Profunda Femoral Vein: No evidence of thrombus. Normal
compressibility and flow on color Doppler imaging.

Femoral Vein: No evidence of thrombus. Normal compressibility,
respiratory phasicity and response to augmentation.

Popliteal Vein: No evidence of thrombus. Normal compressibility,
respiratory phasicity and response to augmentation.

Calf Veins: No evidence of thrombus. Normal compressibility and flow
on color Doppler imaging.

Superficial Great Saphenous Vein: Limited visualization in the calf
secondary to soft tissue swelling. Visualized portion show no
evidence of thrombus. Normal compressibility and flow on color
Doppler imaging.

Venous Reflux:  None.

Other Findings: Calf soft tissue swelling. Complex collection RIGHT
popliteal fossa question complex Baker cyst 6.1 x 1.6 x 3.8 cm.

LEFT LOWER EXTREMITY

Common Femoral Vein: No evidence of thrombus. Normal
compressibility, respiratory phasicity and response to augmentation.

Saphenofemoral Junction: No evidence of thrombus. Normal
compressibility and flow on color Doppler imaging.

Profunda Femoral Vein: No evidence of thrombus. Normal
compressibility and flow on color Doppler imaging.

Femoral Vein: No evidence of thrombus. Normal compressibility,
respiratory phasicity and response to augmentation.

Popliteal Vein: No evidence of thrombus. Normal compressibility,
respiratory phasicity and response to augmentation.

Calf Veins: No evidence of thrombus. Normal compressibility and flow
on color Doppler imaging.

Superficial Great Saphenous Vein: Limited visualization at the calf
due to swelling. Visualized portions show no evidence of thrombus.
Normal compressibility and flow on color Doppler imaging.

Venous Reflux:  None.

Other Findings: Soft tissue swelling lower LEFT leg. Complex fluid
collection LEFT popliteal fossa question Baker cyst 5.2 x 1.6 x
cm.
IMPRESSION: No evidence of deep venous thrombosis in either lower extremity.

Complicated collections at the popliteal fossa bilaterally question
complicated Auad cysts.

Soft tissue swelling at the calves bilaterally
